# Patient Record
Sex: Male | Born: 1955
Health system: Southern US, Community
[De-identification: ages and names within clinical notes are randomized; demographics above are authoritative.]

## PROBLEM LIST (undated history)

## (undated) DIAGNOSIS — I10 Essential (primary) hypertension: Secondary | ICD-10-CM

## (undated) DIAGNOSIS — G629 Polyneuropathy, unspecified: Secondary | ICD-10-CM

## (undated) DIAGNOSIS — M1460 Charcot's joint, unspecified site: Secondary | ICD-10-CM

## (undated) DIAGNOSIS — E119 Type 2 diabetes mellitus without complications: Secondary | ICD-10-CM

## (undated) DIAGNOSIS — R112 Nausea with vomiting, unspecified: Secondary | ICD-10-CM

## (undated) DIAGNOSIS — K219 Gastro-esophageal reflux disease without esophagitis: Secondary | ICD-10-CM

## (undated) DIAGNOSIS — G4730 Sleep apnea, unspecified: Secondary | ICD-10-CM

## (undated) DIAGNOSIS — Z9889 Other specified postprocedural states: Secondary | ICD-10-CM

## (undated) DIAGNOSIS — F419 Anxiety disorder, unspecified: Secondary | ICD-10-CM

## (undated) DIAGNOSIS — M722 Plantar fascial fibromatosis: Secondary | ICD-10-CM

## (undated) DIAGNOSIS — M1990 Unspecified osteoarthritis, unspecified site: Secondary | ICD-10-CM

## (undated) HISTORY — DX: Anxiety disorder, unspecified: F41.9

## (undated) HISTORY — PX: HERNIA REPAIR: SHX51

## (undated) HISTORY — PX: VEIN SURGERY: SHX48

## (undated) HISTORY — PX: BACK SURGERY: SHX140

## (undated) HISTORY — PX: VEIN LIGATION AND STRIPPING: SHX2653

## (undated) HISTORY — DX: Sleep apnea, unspecified: G47.30

## (undated) HISTORY — DX: Essential (primary) hypertension: I10

## (undated) HISTORY — PX: SPINE SURGERY: SHX786

---
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
## 2010-11-11 ENCOUNTER — Ambulatory Visit: Payer: Self-pay | Admitting: Family Medicine

## 2011-01-27 ENCOUNTER — Inpatient Hospital Stay: Payer: Self-pay | Admitting: Specialist

## 2011-01-29 ENCOUNTER — Inpatient Hospital Stay: Payer: Self-pay | Admitting: Psychiatry

## 2012-04-06 LAB — BASIC METABOLIC PANEL
BUN: 17 mg/dL (ref 4–21)
Creatinine: 1.1 mg/dL (ref 0.6–1.3)
GLUCOSE: 228 mg/dL
POTASSIUM: 4.2 mmol/L (ref 3.4–5.3)
SODIUM: 136 mmol/L — AB (ref 137–147)

## 2012-04-06 LAB — LIPID PANEL
Cholesterol: 187 mg/dL (ref 0–200)
HDL: 47 mg/dL (ref 35–70)
LDL Cholesterol: 122 mg/dL
Triglycerides: 88 mg/dL (ref 40–160)

## 2012-04-06 LAB — TSH: TSH: 1.65 u[IU]/mL (ref 0.41–5.90)

## 2012-04-18 LAB — COMPREHENSIVE METABOLIC PANEL
BUN: 16 mg/dL (ref 7–18)
Bilirubin,Total: 1.3 mg/dL — ABNORMAL HIGH (ref 0.2–1.0)
EGFR (African American): >60
Glucose: 184 mg/dL — ABNORMAL HIGH (ref 65–99)
Osmolality: 278 (ref 275–301)
Potassium: 3.8 mmol/L (ref 3.5–5.1)
SGPT (ALT): 179 U/L — ABNORMAL HIGH (ref 12–78)
Sodium: 136 mmol/L (ref 136–145)
Total Protein: 8.2 g/dL (ref 6.4–8.2)

## 2012-04-18 LAB — ETHANOL
Ethanol %: 0.035 % (ref 0.000–0.080)
Ethanol: 35 mg/dL

## 2012-04-18 LAB — ACETAMINOPHEN LEVEL: Acetaminophen: <2 ug/mL

## 2012-04-18 LAB — CBC
HCT: 44.3 % (ref 40.0–52.0)
HGB: 14.9 g/dL (ref 13.0–18.0)
MCH: 37 pg — ABNORMAL HIGH (ref 26.0–34.0)
MCHC: 33.7 g/dL (ref 32.0–36.0)
MCV: 110 fL — ABNORMAL HIGH (ref 80–100)
RBC: 4.04 10*6/uL — ABNORMAL LOW (ref 4.40–5.90)
RDW: 12.8 % (ref 11.5–14.5)
WBC: 6.5 10*3/uL (ref 3.8–10.6)

## 2012-04-18 LAB — TSH: Thyroid Stimulating Horm: 2.51 u[IU]/mL

## 2012-04-18 LAB — SALICYLATE LEVEL: Salicylates, Serum: <1.7 mg/dL

## 2012-04-19 ENCOUNTER — Inpatient Hospital Stay: Payer: Self-pay | Admitting: Psychiatry

## 2012-04-19 LAB — URINALYSIS, COMPLETE
Bacteria: NONE SEEN
Bilirubin,UR: NEGATIVE
Blood: NEGATIVE
Glucose,UR: NEGATIVE mg/dL (ref 0–75)
Protein: 30
RBC,UR: <1 /HPF (ref 0–5)
Specific Gravity: 1.024 (ref 1.003–1.030)
Squamous Epithelial: NONE SEEN
WBC UR: 1 /HPF (ref 0–5)

## 2012-04-19 LAB — DRUG SCREEN, URINE
Amphetamines, Ur Screen: NEGATIVE (ref ?–1000)
Benzodiazepine, Ur Scrn: POSITIVE (ref ?–200)
Methadone, Ur Screen: NEGATIVE (ref ?–300)
Phencyclidine (PCP) Ur S: NEGATIVE (ref ?–25)
Tricyclic, Ur Screen: NEGATIVE (ref ?–1000)

## 2012-04-24 LAB — HEPATIC FUNCTION PANEL A (ARMC)
Bilirubin,Total: 1.1 mg/dL — ABNORMAL HIGH (ref 0.2–1.0)
SGOT(AST): 235 U/L — ABNORMAL HIGH (ref 15–37)
SGPT (ALT): 260 U/L — ABNORMAL HIGH (ref 12–78)

## 2012-04-27 ENCOUNTER — Ambulatory Visit: Payer: Self-pay | Admitting: Psychiatry

## 2012-05-04 ENCOUNTER — Ambulatory Visit: Payer: Self-pay | Admitting: Psychiatry

## 2012-05-15 ENCOUNTER — Ambulatory Visit: Payer: Self-pay | Admitting: Psychiatry

## 2012-05-24 LAB — HEPATIC FUNCTION PANEL
ALT: 36 U/L (ref 10–40)
AST: 51 U/L — AB (ref 14–40)
Alkaline Phosphatase: 94 U/L (ref 25–125)
BILIRUBIN, TOTAL: 0.6 mg/dL

## 2012-06-03 ENCOUNTER — Inpatient Hospital Stay: Payer: Self-pay | Admitting: Internal Medicine

## 2012-06-03 LAB — COMPREHENSIVE METABOLIC PANEL
Albumin: 3.4 g/dL (ref 3.4–5.0)
Alkaline Phosphatase: 130 U/L (ref 50–136)
BUN: 12 mg/dL (ref 7–18)
Calcium, Total: 8.9 mg/dL (ref 8.5–10.1)
Chloride: 101 mmol/L (ref 98–107)
Creatinine: 1.11 mg/dL (ref 0.60–1.30)
EGFR (Non-African Amer.): >60
Potassium: 4.2 mmol/L (ref 3.5–5.1)

## 2012-06-03 LAB — DRUG SCREEN, URINE
Amphetamines, Ur Screen: NEGATIVE (ref ?–1000)
Benzodiazepine, Ur Scrn: POSITIVE (ref ?–200)
Cannabinoid 50 Ng, Ur ~~LOC~~: NEGATIVE (ref ?–50)
Cocaine Metabolite,Ur ~~LOC~~: NEGATIVE (ref ?–300)
MDMA (Ecstasy)Ur Screen: NEGATIVE (ref ?–500)
Opiate, Ur Screen: NEGATIVE (ref ?–300)
Phencyclidine (PCP) Ur S: NEGATIVE (ref ?–25)

## 2012-06-03 LAB — CBC
HCT: 48.8 % (ref 40.0–52.0)
HGB: 16.8 g/dL (ref 13.0–18.0)
MCH: 37 pg — ABNORMAL HIGH (ref 26.0–34.0)
MCHC: 34.3 g/dL (ref 32.0–36.0)
MCV: 108 fL — ABNORMAL HIGH (ref 80–100)
RBC: 4.53 10*6/uL (ref 4.40–5.90)
RDW: 13.4 % (ref 11.5–14.5)
WBC: 10.3 10*3/uL (ref 3.8–10.6)

## 2012-06-03 LAB — URINALYSIS, COMPLETE
Bilirubin,UR: NEGATIVE
Blood: NEGATIVE
Blood: NEGATIVE
Glucose,UR: NEGATIVE mg/dL (ref 0–75)
Ketone: NEGATIVE
Leukocyte Esterase: NEGATIVE
Leukocyte Esterase: NEGATIVE
Nitrite: NEGATIVE
Ph: 5 (ref 4.5–8.0)
Ph: 5 (ref 4.5–8.0)
Protein: NEGATIVE
Protein: NEGATIVE
RBC,UR: 1 /HPF (ref 0–5)
Specific Gravity: 1.029 (ref 1.003–1.030)
Squamous Epithelial: NONE SEEN
WBC UR: 1 /HPF (ref 0–5)

## 2012-06-03 LAB — LIPASE, BLOOD: Lipase: 295 U/L (ref 73–393)

## 2012-06-03 LAB — TROPONIN I: Troponin-I: <0.02 ng/mL

## 2012-06-03 LAB — ETHANOL: Ethanol %: <0.003 % (ref 0.000–0.080)

## 2012-06-04 LAB — CBC WITH DIFFERENTIAL/PLATELET
Basophil %: 1 %
Eosinophil %: 3.4 %
HCT: 31.7 % — ABNORMAL LOW (ref 40.0–52.0)
HGB: 11.2 g/dL — ABNORMAL LOW (ref 13.0–18.0)
Lymphocyte #: 1 10*3/uL (ref 1.0–3.6)
MCV: 107 fL — ABNORMAL HIGH (ref 80–100)
Monocyte #: 0.3 x10 3/mm (ref 0.2–1.0)
Monocyte %: 5 %
Platelet: 127 10*3/uL — ABNORMAL LOW (ref 150–440)
RBC: 2.98 10*6/uL — ABNORMAL LOW (ref 4.40–5.90)
RDW: 13.6 % (ref 11.5–14.5)

## 2012-06-04 LAB — BASIC METABOLIC PANEL
Calcium, Total: 7.8 mg/dL — ABNORMAL LOW (ref 8.5–10.1)
Chloride: 107 mmol/L (ref 98–107)
Co2: 26 mmol/L (ref 21–32)
Creatinine: 0.77 mg/dL (ref 0.60–1.30)
EGFR (African American): >60
EGFR (Non-African Amer.): >60
Glucose: 99 mg/dL (ref 65–99)
Osmolality: 278 (ref 275–301)

## 2012-06-04 LAB — LIPID PANEL
HDL Cholesterol: 24 mg/dL — ABNORMAL LOW (ref 40–60)
VLDL Cholesterol, Calc: 22 mg/dL (ref 5–40)

## 2012-06-04 LAB — HEMOGLOBIN A1C: Hemoglobin A1C: 5.3 % (ref 4.2–6.3)

## 2012-06-05 LAB — HEMOGLOBIN: HGB: 12.3 g/dL — ABNORMAL LOW (ref 13.0–18.0)

## 2012-06-15 ENCOUNTER — Ambulatory Visit: Payer: Self-pay | Admitting: Psychiatry

## 2012-08-20 LAB — HEMOGLOBIN A1C: Hemoglobin A1C: 5.8

## 2013-03-17 HISTORY — PX: COLON SURGERY: SHX602

## 2014-07-07 NOTE — H&P (Signed)
PATIENT NAME:  Dustin Guerra, Dustin Guerra MR#:  [id number] DATE OF BIRTH:  1955-07-20  DATE OF ADMISSION:  06/03/2012  PRIMARY CARE PHYSICIAN: Kirstie Peri. Caryn Section, MD  REFERRING PHYSICIAN: Conni Slipper, MD  CHIEF COMPLAINT: Altered mental status.   HISTORY OF PRESENT ILLNESS: A 59 year old Caucasian male with a history of hypertension and alcohol abuse, who was brought to the ED due to altered mental status. The patient is more awake now, according to him and his wife. The patient drank alcohol last night, felt dizzy and slept on the couch at home. He called his wife this morning, and his wife found that he was in covered with stool, with decreased mental status, so the patient was sent by EMS to the hospital for further evaluation. The patient was noted to have low blood pressure at 80s and also temperature is 94 to 95. He was treated with normal saline bolus and warm blankets. Blood pressure is still low. The patient denies any fever or chills. No headache or dizziness. No chest pain, palpitation. No abdominal pain, nausea, vomiting or diarrhea. The patient had abdominal pain when the patient came to the ED. Abdominal CAT scan was negative for any obstruction.   PAST MEDICAL HISTORY: Hypertension, alcohol abuse.   SOCIAL HISTORY: No smoking, but drinks alcohol on a daily basis. The patient got detoxed in January and stopped drinking, but started again. Denies any  drug abuse.   PAST SURGICAL HISTORY: Back surgery.   FAMILY HISTORY: Grandmother had diabetes.   REVIEW OF SYSTEMS:  CONSTITUTIONAL: The patient denies any fever or chills. No headache or dizziness, but has weakness.  EYES: No double vision or blurred vision.  ENT: No postnasal drip, slurred speech or dysphagia.  CARDIOVASCULAR: No chest pain, palpitation, orthopnea or nocturnal dyspnea. No leg edema.  PULMONARY: Mild cough. No shortness of breath or hemoptysis. No wheezing or sputum.  GASTROINTESTINAL: No abdominal pain, nausea, vomiting or  diarrhea. No melena. No bloody stool.  GENITOURINARY: No dysuria, hematuria or incontinence.  SKIN: No rash or jaundice.  NEUROLOGY: Has altered mental status, but no seizure, and questionable loss of consciousness. Also has a tremor.   ALLERGIES: None.   HOME MEDICATIONS:  1. Sertraline 50 mg p.o. b.i.d.  2. Ranitidine 150 mg p.o. q.12 hours.  3. Propranolol 40 mg p.o. b.i.d. for benign tremor.  4. Metformin 500 mg p.o. b.i.d. 5. Norvasc 5 mg p.o. daily   PHYSICAL EXAMINATION:  VITAL SIGNS: Temperature 94.5, blood pressure was 85/55 and now is 100/65, pulse 90, O2 saturation 95% on oxygen.  GENERAL: The patient is weak, but confused, in no acute distress.  HEENT: Pupils round and equally reactive to light and accommodation. Moist oral mucosa. Clear oropharynx.  NECK: Supple. No JVD or carotid bruits. No lymphadenopathy. No thyromegaly.  CARDIOVASCULAR: S1, S2, regular rate and rhythm. No murmurs or gallops.  PULMONARY: Bilateral air entry. No wheezing or rales. No use of accessory muscles to breathe.  ABDOMEN: Soft. No distention. No tenderness. No organomegaly. Bowel sounds present.  EXTREMITIES: No edema, clubbing or cyanosis. No calf tenderness. Pedal pulses present.  SKIN: No rash or jaundice.  NEUROLOGIC: The patient is awake, alert, but confused. Follows commands. No focal deficits. Power 5/5. Sensation intact.   LABORATORY DATA: CAT scan of head: No evidence of acute ischemic infarct or hemorrhage, diffuse cerebral atrophy. CAT scan of abdomen and pelvis did not show any obstruction or acute inflammation. WBC 10.3, hemoglobin 16.8, platelets 260. Glucose 189, BUN 12, creatinine 1.11,  sodium 137, potassium 4.2, chloride 101, bicarbonate 23. Troponin less than 0.02. Alcohol level less than 3. Lipase 295. EKG showed normal sinus rhythm at 75 BPM.   IMPRESSION:  1. Hypotension, possibly due to dehydration.  2. Altered mental status, possibly due to hypotension or dehydration or  alcohol.  3. Hypothermia.  4. Alcohol abuse.  5. History of hypertension.   PLAN OF TREATMENT:  1. The patient will be admitted to medical floor. Will hold Norvasc. Give normal saline IV. Follow up BMP, magnesium level and ammonia level.  2. For hypothermia, continue warm blankets.  3. For alcohol abuse, we will start CIWA protocol, watch for DT.  4. For diabetes, will start sliding scale. Hold metformin and check hemoglobin A1c and lipid panel.  5. Fall and aspiration precautions.  6. Gastrointestinal and deep vein thrombosis prophylaxis.   I discussed the patient's condition and the plan of treatment with the patient and the patient's wife.   TIME SPENT: About 58 minutes.   ____________________________ Demetrios Loll, MD qc:OSi D: 06/03/2012 12:15:37 ET T: 06/03/2012 12:32:20 ET JOB#: [id number]  cc: Demetrios Loll, MD, <Dictator> Demetrios Loll MD ELECTRONICALLY SIGNED 06/05/2012 18:10

## 2014-07-07 NOTE — H&P (Signed)
PATIENT NAME:  Dustin Guerra, Dustin Guerra MR#:  [id number] DATE OF BIRTH:  February 05, 1956  DATE OF ADMISSION:  04/19/2012  INITIAL ASSESSMENT AND PSYCHIATRIC EVALUATION    IDENTIFYING INFORMATION: The patient is a 59 year old white male, not employed right now but was employed as a pipe fitter a few months ago; and his boss man told him that he had to get help before he can come back to work for him, and he was recommended to get help for his alcohol drinking.  The patient is married for the second time for 27 years and lives with his wife.    CHIEF COMPLAINT: The patient comes for admission to [facility] with a chief complaint, "I need to quit drinking.  I need help for this problem."  HISTORY OF PRESENT ILLNESS: When the patient was asked when he last felt well, he reported that he cannot remember when he last felt well.  He reports that he has been drinking alcohol at a rate of 2 fifths per day for the past 3 months.  Prior to that, he drank alcohol but much less than this.  He reports  family-related stressors.  He reported, "I always drink alcohol."    PAST PSYCHIATRIC HISTORY:  History of inpatient on Psychiatry in November 2012 for a similar problem of wanting to stop drinking alcohol.  He was detoxed and sent home, and he reports that he started drinking 3 months later because of family-related stuff.  He just stayed away from alcohol and was able to stay sober for 3 months.  No history of suicide attempt.  He is not being followed by any psychiatrist.  He has never been to any alcohol programs, so far.   FAMILY HISTORY: He was raised by his parents.  Father was a truck Geophysicist/field seismologist.  He died of throat cancer.  Mother was a housewife, died of alcoholism.  His brother had prescription pill abuse problem and died from overdose of the same.    PERSONAL HISTORY:  He was born in [place].  He graduated from high school.  No college.  He was raised by his family, as stated above.    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  He last worked a few months ago because of his alcohol drinking and was told by the boss man to get help for his alcohol drinking, and he will hire him back.  MILITARY HISTORY:   None.   MARRIAGES: Married twice.  First marriage lasted for a few years.  She left him.  He has 1 daughter from the first marriage who is in her 10s and lives in [place].  He is in touch with her.  The second marriage is for 27 years.  He has 2 children from the second marriage.    ALCOHOL AND DRUGS:  First drink of alcohol was after high school.  It became a problem in 2006 when he became a heavy drinker of alcohol.  No history of DWIs, never arrested for public drunkenness.  He does admit to DTs, and he saw things and saw visions around.  He admits to blackouts and admits to feeling nervous and having tremors.  Currently, he has been drinking at the rate of 2 fifths of hard liquor per day.  He denies THC smoking,  denies street or prescription drug abuse.  He denies smoking nicotine cigarettes.    PAST MEDICAL HISTORY:  He has high blood pressure and, in fact, labile hypertension and is on medications for the same.  No known history of diabetes mellitus.  He status post back surgery 22 years ago, status post surgery for varicose vein stripping 35 years ago.  No history of motor vehicle accident or being unconscious.  He is being [facility] at [facility].  Last appointment was some time ago.  Next appointment is coming up 04/27/2012.    CURRENT MEDICATIONS: Prozac and Valium 5 mg a few times a day.  He takes propranolol and amlodipine for blood pressure, and is not sure of the exact medication.   PHYSICAL EXAMINATION:  VITAL SIGNS: Blood pressure is 148/90 mmHg, pulse is 92 per minute and regular, respirations 20 per minute,  temperature is 96.2.  HEENT: Head is normocephalic, atraumatic.  Eyes: PERRLA.  Fundi are bilaterally benign.  EOMs are intact.  Tympanic membranes are visualized.  No exudate.  LUNGS:  Clear to auscultation.  HEART: Normal S1, S2 without any murmurs or gallops. ABDOMEN: Soft, no organomegaly.  Bowel sounds heard. SKIN: No rashes, no bruises.  RECTAL: Deferred. NEUROLOGICAL:  Gait is normal, though it is unsteady, and he has been using a walker because of him being intoxicated with alcohol.  Cranial nerves II through XII are grossly intact.  DTRs and 2+ normal, plantars are normal response.   MENTAL STATUS EXAMINATION:  The patient is dressed in hospital scrubs, appears intoxicated and face is red.  He knows his person, though he said he knew it was 2014, February, but could not tell the exact date.  He is pleasant, polite and cooperative.  He appears very shaky and tremulous and nervous.  Mood is very anxious with flat affect.  Thought process is logical, and he wants to get help for his alcohol drinking so that he can go back to work.  He denies any ideas or plans to hurt himself or others.  He denies any visual or auditory hallucinations, denies any paranoia or suspicious ideas.  He could spell the word world forward and backward without any problems.  He can count money.  He could recall, but he took some time because he is not able to focus and pay attention.  He knew the cap[place] of [place], and the [place] the [place], and the name of the current Software engineer.  He does admit to appetite and sleep disturbance because of his alcohol drinking.  Insight and judgment are guarded.    IMPRESSION:  AXIS I:   1. Alcohol dependence, chronic, continuous, with intoxication.  2. Alcohol withdrawal delirium, resolving with help. 3. Substance-induced mood disorder.    AXIS II:  Deferred.   AXIS III:  Hypertension.  AXIS IV:  Severe.  Long history of alcohol dependence which has resulted in  occupational and financial problems.   AXIS V:  Global Assessment of Functioning score is 25.   PLAN: The patient is admitted to [facility] for close observation and management.  He will be started on CIWA protocol and will be continued on the rest of his medications.  During the stay in the hospital, he will be given milieu therapy and supportive counseling.  He will take part in individual and group therapy where substance abuse issues will be addressed.  At the time of discharge, he will complete his alcohol detox and will have  enough insight into his substance abuse problem.  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   ____________________________ Wallace Cullens. Franchot Mimes, MD skc:cb D: 04/19/2012 16:16:23 ET T: 04/19/2012 17:50:10 ET JOB#: [id number] cc: Arlyn Leak K. Franchot Mimes, MD, <Dictator> Dewain Penning MD ELECTRONICALLY SIGNED 04/20/2012 13:43

## 2014-07-07 NOTE — H&P (Signed)
PATIENT NAME:  Dustin Guerra, Dustin Guerra MR#:  [id number] DATE OF BIRTH:  21-Dec-1955  DATE OF ADMISSION:  04/19/2012  REFERRING PHYSICIAN: Lenise Arena, MD  ATTENDING PHYSICIAN:  Orson Slick, MD   IDENTIFYING DATA: Dustin Guerra is a 59 year old male with alcoholism.   CHIEF COMPLAINT:  "I need to stop."   HISTORY OF PRESENT ILLNESS: The patient is a lifelong alcoholic. He had a 10-year period of sobriety but relapsed a long time ago. He was hospitalized at [facility] in November 2012 for alcohol detox. He was able to maintain sobriety for 3 months and then relapsed on alcohol. He used to drink 1/2 gallon a day, recently a fifth a day. He reports multiple stressors. He lost his house, his marriage fell apart; even though they live together, his wife stays in a separate room, and they do not talk to each other.  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 He got a message from his old boss that he could be hired again if he sobers up and straightens up. The patient feels that this is a good time to try.  He moved to a new place, a condo that he owns. He hopes that his wife will come around and stay with him, as they never were formerly divorced. He still is upset about losing his house, but it seems that he has a new chance and needs to get over it. He is very clear that he does not want to do alcohol rehab and came here for detox only.  He denies symptoms of depression, anxiety, or psychosis and feels that he is simply a drunk.   PAST PSYCHIATRIC HISTORY: Lifelong history of alcoholism with a 10-year period of sobriety.  No psychiatric hospitalizations except for the one in 2012 here.  No suicide attempt. No substance use other than alcohol use.   FAMILY PSYCHIATRIC HISTORY: His mother was an alcoholic and died from it.  His brother died at the age of 19 from prescription pill overdose.   PAST MEDICAL HISTORY: Hypertension, dyslipidemia, diabetes.   MEDICATIONS ON ADMISSION: Zoloft 50  mg daily, Valium 10 mg twice daily, propranolol 40 mg twice daily, amlodipine 5 mg daily.   ALLERGIES: No known drug allergies.   SOCIAL HISTORY: He is originally from [facility]. He graduated from high school.  He is married for the second time, although his marriage is on the rocks. He has 3 children and grandchildren. 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 He reports no DUIs or legal problems.   REVIEW OF SYSTEMS:  CONSTITUTIONAL: No fevers or chills. No weight changes.  EYES: No double or blurred vision.  ENT: No hearing loss.  RESPIRATORY: No shortness of breath or cough.  CARDIOVASCULAR: No chest pain or orthopnea.  GASTROINTESTINAL: No abdominal pain, nausea, vomiting, or diarrhea.  GENITOURINARY: No incontinence or frequency.  ENDOCRINE: No heat or cold intolerance.  LYMPHATIC: No anemia or easy bruising.   INTEGUMENTARY: No acne or rash.  MUSCULOSKELETAL: No muscle or joint pain.  NEUROLOGIC: No tingling or weakness.  PSYCHIATRIC: See history of present illness for details.   PHYSICAL EXAMINATION: VITAL SIGNS: Blood pressure 157/109, pulse 100, respirations 20, temperature 97.7.  GENERAL: This is a well-developed gentleman looking older than his stated age, in no acute distress.  HEENT: The pupils are equal, round, and reactive to light. Sclerae are anicteric.  NECK: Supple. No thyromegaly.  LUNGS: Clear to auscultation. No dullness to percussion.  HEART: Regular  rhythm and rate. No murmurs, rubs, or gallops.  ABDOMEN: Soft, nontender, nondistended. Positive bowel sounds.  MUSCULOSKELETAL: Normal muscle strength in all extremities.  SKIN: No rashes or bruises.  LYMPHATIC: No cervical adenopathy.  NEUROLOGICAL: Cranial nerves II through XII are intact.   LABORATORY DATA: Chemistries are within normal limits except for blood glucose of 184. Blood alcohol level is 0.035.  LFTs:  Total bilirubin 1.3, AST 222, ALT 179, TSH 2.51. Urine tox screen positive for  benzodiazepines. CBC within normal limits except for MCV of 110. Urinalysis is not suggestive of urinary tract infection. Serum acetaminophen and salicylates are low.   MENTAL STATUS EXAMINATION ON ADMISSION: The patient is examined in the  Emergency Room. He is asleep but easily arousable. He is oriented to person, place, time, and situation. He is pleasant, polite, and cooperative. He is wearing hospital scrubs. He maintains good eye contact. His speech is soft and slow. Mood is okay with anxious affect. Thought processing is logical and goal oriented with poverty of thought. He denies thoughts of hurting himself or others. There are no delusions or paranoia. He denies auditory or visual hallucinations. He has a history of complicated detox but did not experience any symptoms so far. His cognition is grossly intact. His insight and judgment are questionable.   SUICIDE RISK ASSESSMENT ON ADMISSION: This is a patient with a long history of alcoholism who has been drinking for the past two years, came for detox when faced with the prospect of regaining his employment.   INITIAL DIAGNOSES:  AXIS I:  Alcohol dependence.   AXIS II: Deferred.   AXIS III: Hypertension, diabetes, alcoholic liver disease.   AXIS IV: Substance abuse, relationship, financial, employment, access to care, treatment compliance.   AXIS V: Global Assessment of Functioning score on admission 35.   PLAN: The patient was admitted to [facility] for safety, stabilization and medication management. He was initially placed on suicide precautions and was closely monitored for any unsafe behaviors. He underwent full psychiatric and risk assessment. He received pharmacotherapy, individual and group psychotherapy, substance abuse counseling, and support from therapeutic milieu.   1. Alcohol detox: He was placed on a CIWA protocol. He was given Librium 25 mg 4 times a day, in addition.   2. Substance abuse treatment: The patient declines. We will try to see if he would participate in IOP.   3. Medical: We will continue his antihypertensives and monitor his blood glucose.  4. Disposition: He will most likely return to home.   ____________________________ Herma Ard B. Bary Leriche, MD jbp:cb D: 04/20/2012 16:06:15 ET T: 04/20/2012 17:26:30 ET JOB#: [id number]  cc: Remi Lopata B. Bary Leriche, MD, <Dictator> Clovis Fredrickson MD ELECTRONICALLY SIGNED 05/13/2012 6:41

## 2014-07-07 NOTE — Discharge Summary (Signed)
PATIENT NAME:  Dustin Guerra, Dustin Guerra MR#:  [id number] DATE OF BIRTH:  [birth year]  DATE OF ADMISSION:  06/03/2012 DATE OF DISCHARGE:  06/05/2012  PRIMARY CARE PHYSICIAN: Kirstie Peri. Caryn Section, MD  DISCHARGE DIAGNOSES:  1.  Altered mental status with metabolic encephalopathy.  2.  Hypotension.  3.  Dehydration.  4.  Hypothermia.  5.  Alcohol abuse.  6.  Hypertension.  7.  Anemia.  8.  Thrombocytopenia.   CODE STATUS: Full code.   HOME MEDICATIONS:  1.  Metformin 500 mg p.o. b.i.d.  2.  Propranolol 40 mg p.o. b.i.d. for benign tremor.  3.  Norvasc 5 mg p.o. daily.  4.  Ranitidine 150 mg p.o. q.12 hours. 5.  Sertraline 50 mg p.o. b.i.d.  6.  Campral 333 mg p.o. tablets 2 tabs 3 times a day.  7.  Valium 10 mg p.o. every 8 hours p.r.n.   DIET: Low sodium, low fat, low cholesterol, ADA diet.   ACTIVITY: As tolerated.   FOLLOWUP CARE: Follow up with PCP within 1 to 2 weeks.   REASON FOR ADMISSION: Altered mental status.   HOSPITAL COURSE: The patient is a 59 year old Caucasian male with a history of hypertension, alcohol abuse, who was brought to the ED due to altered mental status. For detailed history and physical examination, please refer to the admission note dictated by me. The patient was found to have low blood pressure in 80s in ED, was treated with normal saline bolus  3 liters, still had low blood pressure so the patient was admitted for hypotension. In addition, the patient's temperature was 94, was treated with warm blanket. The patient's altered mental status is possibly due to metabolic encephalopathy secondary to hypotension, dehydration or alcohol abuse. The patient was admitted to CCU for hypotension and altered mental status. After admission, the patient has been treated with IV fluid support. Blood pressure has been stable around 100 with IV fluid support.   For alcohol abuse, the patient has been placed on CIWA protocol, but no tremor or shaking, no evidence of DTs.   For  diabetes, the patient's metformin was on hold. Restart sliding scale. Blood sugar has been stable.   For anemia, the patient's hemoglobin decreased to 11.2 from normal range, which is possibly due to IV fluid dilution. The patient's hemoglobin increased to 12.3 today. Stool occult is negative. The patient has no symptoms. Vital signs stable. The patient's hemoglobin A1c is 5.3. The patient is clinically stable and will be discharged to home today. Also, the patient's ammonia is 36.   Discussed the patient's discharge plan with the patient and the case manager.   TIME SPENT: About 33 minutes.    ____________________________ Demetrios Loll, MD qc:jm D: 06/05/2012 15:13:32 ET T: 06/05/2012 15:43:30 ET JOB#: [id number]  cc: Demetrios Loll, MD, <Dictator> Demetrios Loll MD ELECTRONICALLY SIGNED 06/05/2012 18:18

## 2014-07-07 NOTE — Consult Note (Signed)
Met with patient at the request of Dr. Gavin Pound, Psychologist to explore possibility of participation in the [facility] Program (CD-IOP) at this inpatient discharge. At the time of meeting with him, he exhibited speech flow that was normal, oriented X5, cooperative. He also demonstrated tremors, psychomotor agitation as well as anxiety and therefore was not oriented to the CD-IOP. Assigned nursed informed of status of patient. Plans are to return to assess the patient for the CD-IOP on Thursday for possible treatment in the CD-IOP at this [place] inpatient discharge.      Electronic Signatures: Laqueta Due (PsyD)  (Signed on 04-Feb-14 16:34)  Authored  Last Updated: 04-Feb-14 16:34 by Laqueta Due ([place])

## 2014-07-07 NOTE — Consult Note (Signed)
Consult:  Patient was interviewed and oriented to the CD-IOP and was seen as an excellent candidate for treatment at this level of care was also able to agree that at this in patient discharge he plans to attend the CD-IOP. RN informed of plans to attend the [facility] CD-IOP and therefore will meet with patient Tuesday at [facility] to complete admission intake  in order to participate in the CD-IOP treatment at this discharge starting Tuesday April 27, 2012.   Electronic Signatures: Laqueta Due (PsyD)  (Signed on 07-Feb-14 18:04)  Authored  Last Updated: 07-Feb-14 18:04 by Laqueta Due (PsyD)

## 2015-01-17 ENCOUNTER — Encounter: Payer: Self-pay | Admitting: Family Medicine

## 2015-01-17 LAB — HM COLONOSCOPY

## 2015-07-27 ENCOUNTER — Telehealth: Payer: Self-pay | Admitting: Family Medicine

## 2015-07-27 NOTE — Telephone Encounter (Signed)
[place] the [place] 5 [place] wanted to know if he can get reestablish with you  Cb# [phone]

## 2015-07-30 NOTE — Telephone Encounter (Signed)
Pt called back asking if a decision has been made.  He stated that he has had a lot of surgeries in the past couple years.   Dealing with ruptured colon and has neuropathy in his feet from past diabetes.   He is not on any medications now.  He most presently has been followed by a family doctor in [facility].   His call back is [phone]  Thanks, [name]

## 2015-07-31 NOTE — Telephone Encounter (Signed)
That's fine. It looks like he was last seen in June 2014.

## 2015-07-31 NOTE — Telephone Encounter (Signed)
I spoke with pt and scheduled appt for Friday 08/03/15. Thanks TNP

## 2015-08-01 DIAGNOSIS — F10931 Alcohol use, unspecified with withdrawal delirium: Secondary | ICD-10-CM | POA: Insufficient documentation

## 2015-08-01 DIAGNOSIS — F329 Major depressive disorder, single episode, unspecified: Secondary | ICD-10-CM | POA: Insufficient documentation

## 2015-08-01 DIAGNOSIS — R748 Abnormal levels of other serum enzymes: Secondary | ICD-10-CM | POA: Insufficient documentation

## 2015-08-01 DIAGNOSIS — F410 Panic disorder [episodic paroxysmal anxiety] without agoraphobia: Secondary | ICD-10-CM | POA: Insufficient documentation

## 2015-08-01 DIAGNOSIS — F32A Depression, unspecified: Secondary | ICD-10-CM | POA: Insufficient documentation

## 2015-08-01 DIAGNOSIS — I10 Essential (primary) hypertension: Secondary | ICD-10-CM | POA: Insufficient documentation

## 2015-08-01 DIAGNOSIS — F1011 Alcohol abuse, in remission: Secondary | ICD-10-CM | POA: Insufficient documentation

## 2015-08-01 DIAGNOSIS — R7303 Prediabetes: Secondary | ICD-10-CM | POA: Insufficient documentation

## 2015-08-01 DIAGNOSIS — F419 Anxiety disorder, unspecified: Secondary | ICD-10-CM | POA: Insufficient documentation

## 2015-08-01 DIAGNOSIS — Z91030 Bee allergy status: Secondary | ICD-10-CM | POA: Insufficient documentation

## 2015-08-01 DIAGNOSIS — F10231 Alcohol dependence with withdrawal delirium: Secondary | ICD-10-CM | POA: Insufficient documentation

## 2015-08-01 DIAGNOSIS — L719 Rosacea, unspecified: Secondary | ICD-10-CM | POA: Insufficient documentation

## 2015-08-01 DIAGNOSIS — K219 Gastro-esophageal reflux disease without esophagitis: Secondary | ICD-10-CM | POA: Insufficient documentation

## 2015-08-03 ENCOUNTER — Ambulatory Visit (INDEPENDENT_AMBULATORY_CARE_PROVIDER_SITE_OTHER): Payer: Self-pay | Admitting: Family Medicine

## 2015-08-03 ENCOUNTER — Encounter: Payer: Self-pay | Admitting: Family Medicine

## 2015-08-03 VITALS — BP 148/82 | HR 76 | Temp 98.1°F | Resp 16 | Ht 74.0 in | Wt 238.0 lb

## 2015-08-03 DIAGNOSIS — M545 Low back pain, unspecified: Secondary | ICD-10-CM

## 2015-08-03 DIAGNOSIS — G629 Polyneuropathy, unspecified: Secondary | ICD-10-CM

## 2015-08-03 DIAGNOSIS — M722 Plantar fascial fibromatosis: Secondary | ICD-10-CM

## 2015-08-03 MED ORDER — TRAMADOL HCL 50 MG PO TABS: 50.0000 mg | ORAL_TABLET | Freq: Three times a day (TID) | ORAL | Status: DC | PRN

## 2015-08-03 MED ORDER — HYDROCODONE-ACETAMINOPHEN 7.5-325 MG PO TABS: ORAL_TABLET | ORAL | Status: DC

## 2015-08-03 NOTE — Progress Notes (Signed)
Patient ID: Dustin Guerra, male   DOB: Aug 03, 1955, 60 y.o.   MRN: [id number]       Patient: Dustin Guerra Male    DOB: 1955/03/22   60 y.o.   MRN: [id number] Visit Date: 08/03/2015  Today's Provider: Lelon Huh, MD   Chief Complaint  Patient presents with  . New Patient (Initial Visit)    re- establish   Subjective:    HPI Pt is here today to re- establish care. He has been living in [place]. He was previously being treated for DM when he was a patient here in 2014. He has since been taking off all DM medications and blood pressure medication. He had a colon resection due to severe diverticulitis and was hospitalized for a month, with nothing orally. He has not done labs done since late 2016. He has chronic back pain and plantar fasciitis and neuropathy in feet. He is needing a refill on Tramadol 50mg  and Hydrocodone/Apap 7.5/325 mg today that his MD in [facility] had been giving him. He only takes them if he needs them and usually only takes 1/2 twice a day of the Hydrocodone/APAP.  He has all of his prescription bottles from previous MD with him today.   He states he occasionally checks his blood sugars which are usually around 100.   He has recently started a new job in [employer] and expects to have insurance in a few weeks or so.     Allergies  Allergen Reactions  . Codeine Itching   Previous Medications   ASPIRIN 81 MG TABLET    Take by mouth.   DIAZEPAM (VALIUM) 10 MG TABLET    Take by mouth.   DOCUSATE SODIUM (COLACE) 100 MG CAPSULE    Take 100 mg by mouth 2 (two) times daily.   EPINEPHRINE (EPIPEN 2-PAK) 0.3 MG/0.3 ML IJ SOAJ INJECTION       GABAPENTIN (NEURONTIN) 300 MG CAPSULE    Take 300 mg by mouth 3 (three) times daily.   HYDROCODONE-ACETAMINOPHEN (NORCO) 7.5-325 MG TABLET    Take 0.5 tablets by mouth 2 (two) times daily. PRN   IRON PO    Take by mouth.   LANSOPRAZOLE (PREVACID) 15 MG CAPSULE    Take 15 mg by mouth daily at 12 noon.   METRONIDAZOLE  (METROGEL) 1 % GEL       MULTIPLE VITAMINS-MINERALS (CENTRUM SILVER) CHEW    Chew by mouth.   OMEGA-3-6-9 CAPS    Take by mouth 2 (two) times daily.   TRAMADOL (ULTRAM) 50 MG TABLET    Take by mouth every 6 (six) hours as needed.    Review of Systems  Constitutional: Negative.   HENT: Positive for dental problem.   Eyes: Positive for photophobia and discharge.  Respiratory: Negative.   Cardiovascular: Negative.   Gastrointestinal: Negative.   Endocrine: Negative.   Genitourinary: Negative.   Musculoskeletal: Positive for arthralgias.  Skin: Negative.   Allergic/Immunologic: Negative.   Neurological: Negative.   Hematological: Negative.   Psychiatric/Behavioral: Negative.     Social History  Substance Use Topics  . Smoking status: Former Research scientist (life sciences)  . Smokeless tobacco: Not on file     Comment: quit 2000, smoked about 1.5 packs for about 5 years  . Alcohol Use: No   Objective:   BP 148/82 mmHg  Pulse 76  Temp(Src) 98.1 F (36.7 C) (Oral)  Resp 16  Ht 6\' 2"  (1.88 m)  Wt 238 lb (107.956 kg)  BMI 30.54 kg/m2  Physical Exam   General Appearance:    Alert, cooperative, no distress  Eyes:    PERRL, conjunctiva/corneas clear, EOM's intact       Lungs:     Clear to auscultation bilaterally, respirations unlabored  Heart:    Regular rate and rhythm  Neurologic:   Awake, alert, oriented x 3. No apparent focal neurological           defect.          Assessment & Plan:     1. Neuropathy (HCC)  - gabapentin (NEURONTIN) 300 MG capsule; Take 300 mg by mouth 3 (three) times daily. - HYDROcodone-acetaminophen (NORCO) 7.5-325 MG tablet; 1/2-1 tablet up to four times a day as needed  Dispense: 120 tablet; Refill: 0 - traMADol (ULTRAM) 50 MG tablet; Take 1 tablet (50 mg total) by mouth 3 (three) times daily as needed.  Dispense: 90 tablet; Refill: 5  2. Plantar fasciitis   3. Bilateral low back pain without sciatica  - celecoxib (CELEBREX) 200 MG capsule; Take 200 mg by mouth 2  (two) times daily.     Follow up for routine labs in 1-2 months.   Lelon Huh, MD  [facility]

## 2015-08-04 DIAGNOSIS — G609 Hereditary and idiopathic neuropathy, unspecified: Secondary | ICD-10-CM | POA: Insufficient documentation

## 2015-08-04 DIAGNOSIS — M722 Plantar fascial fibromatosis: Secondary | ICD-10-CM | POA: Insufficient documentation

## 2015-08-04 HISTORY — DX: Plantar fascial fibromatosis: M72.2

## 2015-09-11 ENCOUNTER — Other Ambulatory Visit: Payer: Self-pay | Admitting: Family Medicine

## 2015-09-11 DIAGNOSIS — G629 Polyneuropathy, unspecified: Secondary | ICD-10-CM

## 2015-09-11 NOTE — Telephone Encounter (Signed)
Please review-aa 

## 2015-09-11 NOTE — Telephone Encounter (Signed)
Pt contacted office for refill request on the following medications: HYDROcodone-acetaminophen (NORCO) 7.5-325 MG tablet Last OV: 08/03/15 Last written: 08/03/15 Pt was advised that Dr. Caryn Section is out of the office this week and this will have to be sent to another provider in the office. Please advise. Thanks TNP

## 2015-09-12 MED ORDER — HYDROCODONE-ACETAMINOPHEN 7.5-325 MG PO TABS: ORAL_TABLET | ORAL | Status: DC

## 2015-09-12 NOTE — Telephone Encounter (Signed)
1rf 

## 2015-09-12 NOTE — Telephone Encounter (Signed)
Rx printed and ready to sign.

## 2015-09-14 ENCOUNTER — Telehealth: Payer: Self-pay | Admitting: Family Medicine

## 2015-09-14 NOTE — Telephone Encounter (Signed)
Pt contacted office for refill request on the following medications: 1. gabapentin (NEURONTIN) 300 MG capsule  2. celecoxib (CELEBREX) 200 MG capsule 3. cilostazol (PLETAL) 100 MG tablet [facility] [address] [name] stated that when he came in for an OV with Dr. Caryn Section on 08/03/15 he went over the medications that he was taken. They were provided by another provider during the time that pt had left our office b/c he had moved away. Pt would like Dr. Caryn Section to take over the medications. Pt was advised that Dr. Caryn Section is out of the office until Monday 09/17/15. Please advise. Thanks TNP

## 2015-09-14 NOTE — Telephone Encounter (Signed)
Please review-aa 

## 2015-09-15 ENCOUNTER — Other Ambulatory Visit: Payer: Self-pay | Admitting: Family Medicine

## 2015-09-15 DIAGNOSIS — G629 Polyneuropathy, unspecified: Secondary | ICD-10-CM

## 2015-09-15 DIAGNOSIS — M545 Low back pain, unspecified: Secondary | ICD-10-CM

## 2015-09-15 MED ORDER — CILOSTAZOL 100 MG PO TABS: 100.0000 mg | ORAL_TABLET | Freq: Two times a day (BID) | ORAL | Status: DC | PRN

## 2015-09-15 MED ORDER — CELECOXIB 200 MG PO CAPS: 200.0000 mg | ORAL_CAPSULE | Freq: Two times a day (BID) | ORAL | Status: DC

## 2015-09-15 MED ORDER — GABAPENTIN 300 MG PO CAPS: 300.0000 mg | ORAL_CAPSULE | Freq: Three times a day (TID) | ORAL | Status: DC

## 2015-09-25 ENCOUNTER — Telehealth: Payer: Self-pay | Admitting: Emergency Medicine

## 2015-09-25 DIAGNOSIS — G629 Polyneuropathy, unspecified: Secondary | ICD-10-CM

## 2015-09-25 NOTE — Telephone Encounter (Signed)
Pt called to find out if there was a reason his Gabapentin was reduced from 2 TID to 1 TID. He has been taking 2 TID when he was [place]. He did not know if there is a reason or if it was a mistake. Please advise.   Can leave message.  [facility] [address]

## 2015-09-26 NOTE — Telephone Encounter (Signed)
Medication was [place] by [place] Medication list that was put into computer at his office visit on 5/19  was 300mg  three times a day. Please double check and make sure is is actually 2 x 300mg  tablets three times a day. If so can send in new with new sig for #180, rf x 5

## 2015-09-26 NOTE — Telephone Encounter (Signed)
Pt is requesting a call back.  CB#[phone]/MW

## 2015-09-27 NOTE — Telephone Encounter (Signed)
Left message to call back  

## 2015-10-05 MED ORDER — GABAPENTIN 300 MG PO CAPS: 600.0000 mg | ORAL_CAPSULE | Freq: Three times a day (TID) | ORAL | Status: DC

## 2015-10-05 NOTE — Telephone Encounter (Signed)
LMOVM for pt to return call 

## 2015-10-05 NOTE — Telephone Encounter (Signed)
Medication dosage and sig were verified and pt is taking 2 300 mg tablets three times a day. Medication was sent to pharmacy. Thanks.

## 2015-10-22 ENCOUNTER — Other Ambulatory Visit: Payer: Self-pay | Admitting: Family Medicine

## 2015-10-22 DIAGNOSIS — G629 Polyneuropathy, unspecified: Secondary | ICD-10-CM

## 2015-10-22 MED ORDER — HYDROCODONE-ACETAMINOPHEN 7.5-325 MG PO TABS: ORAL_TABLET | ORAL | 0 refills | Status: DC

## 2015-10-22 NOTE — Telephone Encounter (Signed)
Pt contacted office for refill request on the following medications: HYDROcodone-acetaminophen (NORCO) 7.5-325 MG tablet.  Last written: 09/12/15 Last OV: 08/03/15  Please advise. Thanks TNP

## 2015-11-01 ENCOUNTER — Other Ambulatory Visit: Payer: Self-pay | Admitting: Family Medicine

## 2015-11-01 NOTE — Telephone Encounter (Signed)
Patient is requesting refill on diazepam (VALIUM) 10 MG tablet   [facility]  Cb# [phone]  Patient is completely out of medicine and also wants it called in

## 2015-11-02 ENCOUNTER — Ambulatory Visit: Payer: Self-pay | Admitting: Family Medicine

## 2015-11-02 MED ORDER — DIAZEPAM 10 MG PO TABS: 10.0000 mg | ORAL_TABLET | Freq: Every day | ORAL | 5 refills | Status: DC | PRN

## 2015-11-02 NOTE — Telephone Encounter (Signed)
Rx called in to pharmacy. 

## 2015-12-05 ENCOUNTER — Ambulatory Visit (INDEPENDENT_AMBULATORY_CARE_PROVIDER_SITE_OTHER): Payer: Commercial Managed Care - PPO | Admitting: Family Medicine

## 2015-12-05 ENCOUNTER — Encounter: Payer: Self-pay | Admitting: Family Medicine

## 2015-12-05 VITALS — BP 140/84 | HR 89 | Temp 98.0°F | Resp 16 | Ht 74.0 in | Wt 222.0 lb

## 2015-12-05 DIAGNOSIS — I739 Peripheral vascular disease, unspecified: Secondary | ICD-10-CM | POA: Diagnosis not present

## 2015-12-05 DIAGNOSIS — F419 Anxiety disorder, unspecified: Secondary | ICD-10-CM | POA: Diagnosis not present

## 2015-12-05 DIAGNOSIS — M25441 Effusion, right hand: Secondary | ICD-10-CM | POA: Diagnosis not present

## 2015-12-05 DIAGNOSIS — E119 Type 2 diabetes mellitus without complications: Secondary | ICD-10-CM | POA: Diagnosis not present

## 2015-12-05 DIAGNOSIS — G629 Polyneuropathy, unspecified: Secondary | ICD-10-CM | POA: Diagnosis not present

## 2015-12-05 MED ORDER — HYDROCODONE-ACETAMINOPHEN 7.5-325 MG PO TABS: ORAL_TABLET | ORAL | 0 refills | Status: DC

## 2015-12-05 MED ORDER — CILOSTAZOL 100 MG PO TABS: 100.0000 mg | ORAL_TABLET | Freq: Two times a day (BID) | ORAL | 5 refills | Status: DC | PRN

## 2015-12-05 NOTE — Patient Instructions (Signed)
Go to the [facility] on [address] [place] for right hand Xray

## 2015-12-05 NOTE — Progress Notes (Signed)
Patient: Dustin Guerra Male    DOB: Jul 23, 1955   60 y.o.   MRN: [id number] Visit Date: 12/05/2015  Today's Provider: Lelon Huh, MD   Chief Complaint  Patient presents with  . Follow-up  . Diabetes   Subjective:    HPI  Neuropathy ([place]): Follow up from 08/03/2015. Has no feeling in feet. Checks daily for wounds. Is taking gabapentin consistently which is effect for nerve pain.     Diabetes Mellitus Type II, Follow-up:   Lab Results  Component Value Date   HGBA1C 5.8 08/20/2012   HGBA1C 5.3 06/04/2012   Last seen for diabetes 3 months ago.  Management since then includes; no changes. He reports good compliance with treatment. He is not having side effects. none Current symptoms include none and have been unchanged. Home blood sugar records: fasting range: not checking  Episodes of hypoglycemia? no   Current Insulin Regimen: n/a Most Recent Eye Exam: due Weight trend: stable Prior visit with dietician: no Current diet: well balanced Current exercise: gym  ----------------------------------------------------------------  Complains of pain and swelling in right hand for the last 2-3 weeks. No particular injury. Unable to make a fist.   Also has been having pain and swelling in left great toe around fungal infected toenail. He does not have podiatrist.   Allergies  Allergen Reactions  . Codeine Itching     Current Outpatient Prescriptions:  .  aspirin 81 MG tablet, Take by mouth., Disp: , Rfl:  .  celecoxib (CELEBREX) 200 MG capsule, Take 1 capsule (200 mg total) by mouth 2 (two) times daily., Disp: 60 capsule, Rfl: 5 .  cilostazol (PLETAL) 100 MG tablet, Take 1 tablet (100 mg total) by mouth 2 (two) times daily as needed (for circulation)., Disp: 60 tablet, Rfl: 5 .  diazepam (VALIUM) 10 MG tablet, Take 1 tablet (10 mg total) by mouth daily as needed for anxiety., Disp: 30 tablet, Rfl: 5 .  docusate sodium (COLACE) 100 MG capsule, Take 100 mg  by mouth 2 (two) times daily., Disp: , Rfl:  .  EPINEPHrine (EPIPEN 2-PAK) 0.3 mg/0.3 mL IJ SOAJ injection, , Disp: , Rfl:  .  gabapentin (NEURONTIN) 300 MG capsule, Take 2 capsules (600 mg total) by mouth 3 (three) times daily., Disp: 180 capsule, Rfl: 5 .  HYDROcodone-acetaminophen (NORCO) 7.5-325 MG tablet, 1/2-1 tablet up to four times a day as needed, Disp: 120 tablet, Rfl: 0 .  IRON PO, Take by mouth., Disp: , Rfl:  .  lansoprazole (PREVACID) 15 MG capsule, Take 15 mg by mouth daily at 12 noon., Disp: , Rfl:  .  metroNIDAZOLE (METROGEL) 1 % gel, , Disp: , Rfl:  .  Multiple Vitamins-Minerals (CENTRUM SILVER) CHEW, Chew by mouth., Disp: , Rfl:  .  Omega-3-6-9 CAPS, Take by mouth 2 (two) times daily., Disp: , Rfl:  .  traMADol (ULTRAM) 50 MG tablet, Take 1 tablet (50 mg total) by mouth 3 (three) times daily as needed., Disp: 90 tablet, Rfl: 5  Review of Systems  Constitutional: Negative for appetite change, chills and fever.  Respiratory: Negative for chest tightness, shortness of breath and wheezing.   Cardiovascular: Negative for chest pain and palpitations.  Gastrointestinal: Negative for abdominal pain, nausea and vomiting.    Social History  Substance Use Topics  . Smoking status: Former Research scientist (life sciences)  . Smokeless tobacco: Not on file     Comment: quit 2000, smoked about 1.5 packs for about 5 years  . Alcohol  use No   Objective:   BP 140/84 (BP Location: Right Arm, Patient Position: Sitting, Cuff Size: Large)   Pulse 89   Temp 98 F (36.7 C) (Oral)   Resp 16   Ht 6\' 2"  (1.88 m)   Wt 222 lb (100.7 kg)   SpO2 98%   BMI 28.50 kg/m   Physical Exam   General Appearance:    Alert, cooperative, no distress  Eyes:    PERRL, conjunctiva/corneas clear, EOM's intact       Lungs:     Clear to auscultation bilaterally, respirations unlabored  Heart:    Regular rate and rhythm  Neurologic:   Awake, alert, oriented x 3. No apparent focal neurological           defect.   Skin:     Onychomycosis noted both feet with minimal swelling but no redness or discharge of surrounding skin.         Assessment & Plan:     1. Type 2 diabetes mellitus without complication, without long-term current use of insulin (HCC) Controlled off medications since 2015 - POCT glycosylated hemoglobin (Hb A1C) - Lipid panel - Renal function panel - Ambulatory referral to Podiatry  2. Neuropathy (HCC) Stable on current combination of gabapentin and prn hydrocodone/apap - HYDROcodone-acetaminophen (NORCO) 7.5-325 MG tablet; 1/2-1 tablet up to four times a day as needed  Dispense: 120 tablet; Refill: 0 - Ambulatory referral to Podiatry  3. Anxiety Continue prn valium   4. Swelling of joint of right hand  - DG Hand Complete Right; Future  5. PVD (peripheral vascular disease) (HCC) Continue Pletal - cilostazol (PLETAL) 100 MG tablet; Take 1 tablet (100 mg total) by mouth 2 (two) times daily as needed (for circulation).  Dispense: 60 tablet; Refill: 5       The entirety of the information documented in the History of Present Illness, Review of Systems and Physical Exam were personally obtained by me. Portions of this information were initially documented by Meyer Cory, CMA and reviewed by me for thoroughness and accuracy.    Lelon Huh, MD  [facility]

## 2015-12-07 DIAGNOSIS — I739 Peripheral vascular disease, unspecified: Secondary | ICD-10-CM | POA: Insufficient documentation

## 2015-12-07 LAB — POCT GLYCOSYLATED HEMOGLOBIN (HGB A1C)
ESTIMATED AVERAGE GLUCOSE: 126
HEMOGLOBIN A1C: 6

## 2015-12-10 ENCOUNTER — Ambulatory Visit
Admission: RE | Admit: 2015-12-10 | Discharge: 2015-12-10 | Disposition: A | Discharge status: Home / Self Care | Payer: Commercial Managed Care - PPO | Source: Ambulatory Visit | Attending: Family Medicine | Admitting: Family Medicine

## 2015-12-10 DIAGNOSIS — M25441 Effusion, right hand: Secondary | ICD-10-CM

## 2015-12-10 DIAGNOSIS — M19041 Primary osteoarthritis, right hand: Secondary | ICD-10-CM | POA: Diagnosis not present

## 2015-12-11 LAB — RENAL FUNCTION PANEL
Albumin: 4.8 g/dL (ref 3.5–5.5)
BUN / CREAT RATIO: 9 (ref 9–20)
BUN: 12 mg/dL (ref 6–24)
CALCIUM: 9.9 mg/dL (ref 8.7–10.2)
CHLORIDE: 97 mmol/L (ref 96–106)
CO2: 26 mmol/L (ref 18–29)
Creatinine, Ser: 1.32 mg/dL — ABNORMAL HIGH (ref 0.76–1.27)
GFR calc Af Amer: 68 mL/min/{1.73_m2} (ref >59)
GFR calc non Af Amer: 59 mL/min/{1.73_m2} — ABNORMAL LOW (ref >59)
Glucose: 97 mg/dL (ref 65–99)
POTASSIUM: 4.8 mmol/L (ref 3.5–5.2)
Phosphorus: 4.1 mg/dL (ref 2.5–4.5)
SODIUM: 139 mmol/L (ref 134–144)

## 2015-12-11 LAB — LIPID PANEL
CHOL/HDL RATIO: 4.3 ratio (ref 0.0–5.0)
Cholesterol, Total: 120 mg/dL (ref 100–199)
HDL: 28 mg/dL — ABNORMAL LOW (ref >39)
LDL CALC: 67 mg/dL (ref 0–99)
Triglycerides: 125 mg/dL (ref 0–149)
VLDL Cholesterol Cal: 25 mg/dL (ref 5–40)

## 2015-12-12 ENCOUNTER — Telehealth: Payer: Self-pay

## 2015-12-12 NOTE — Telephone Encounter (Signed)
Patient was notified of results. Patient expressed understanding. 

## 2015-12-12 NOTE — Telephone Encounter (Signed)
-----   Message from Birdie Sons, MD sent at 12/11/2015  9:15 PM EDT ----- Cholesterol is well controlled at 120. Kidney functions and blood sugar are normal. Continue current medications.  Xray of hand shows only arthritis. No treatment for this

## 2015-12-12 NOTE — Telephone Encounter (Signed)
Notes Recorded by Randal Buba, CMA on 12/12/2015 at 11:33 AM EDT Left message for patient to call back.

## 2015-12-21 ENCOUNTER — Ambulatory Visit: Payer: Self-pay | Admitting: Podiatry

## 2015-12-21 ENCOUNTER — Telehealth: Payer: Self-pay | Admitting: Family Medicine

## 2015-12-21 NOTE — Telephone Encounter (Signed)
Ena Dawley with [facility] [address] would like to follow up on the pt's prior auth for celecoxib (CELEBREX) 200 MG capsule. Please advise. Thanks TNP

## 2015-12-21 NOTE — Telephone Encounter (Signed)
PA was approved. Pharmacy was notified

## 2015-12-24 ENCOUNTER — Telehealth: Payer: Self-pay | Admitting: Family Medicine

## 2015-12-24 NOTE — Telephone Encounter (Signed)
Please advise patient that I have to fill out a prior authorization to try to get insurance to pay for Celebrex. I need to know what other medications he has tried before celebrex and what problems he had.

## 2015-12-24 NOTE — Telephone Encounter (Signed)
Patient states that medication was approved and he picked it up an hour ago. Patient wanted to give you an update he states that you referred him to podiatry and that when he got call for appt they require a $250 copay. Patient states that he can not afford to go. He states that he has had a toe fungus before and that he was prescribed Terbinafine 250mg  from a previous physician. Patient wants to know if you can prescribe medication to treat problem? If so he [facility] KW

## 2015-12-24 NOTE — Telephone Encounter (Signed)
[id number] 

## 2015-12-28 ENCOUNTER — Ambulatory Visit: Payer: Self-pay | Admitting: Podiatry

## 2015-12-30 MED ORDER — TERBINAFINE HCL 250 MG PO TABS: ORAL_TABLET | ORAL | 5 refills | Status: DC

## 2015-12-30 NOTE — Telephone Encounter (Signed)
[facility] [place]

## 2015-12-31 NOTE — Telephone Encounter (Signed)
Left detailed message on vm notifying pt rx was sent to pharmacy.

## 2016-01-04 ENCOUNTER — Ambulatory Visit: Payer: Self-pay | Admitting: Podiatry

## 2016-01-07 ENCOUNTER — Other Ambulatory Visit: Payer: Self-pay | Admitting: Family Medicine

## 2016-01-07 DIAGNOSIS — G629 Polyneuropathy, unspecified: Secondary | ICD-10-CM

## 2016-01-07 MED ORDER — HYDROCODONE-ACETAMINOPHEN 7.5-325 MG PO TABS: ORAL_TABLET | ORAL | 0 refills | Status: DC

## 2016-01-07 NOTE — Telephone Encounter (Signed)
Pt contacted office for refill request on the following medications: ° °HYDROcodone-acetaminophen (NORCO) 7.5-325 MG tablet.  CB#[phone]/MW °  ° °

## 2016-02-11 ENCOUNTER — Other Ambulatory Visit: Payer: Self-pay | Admitting: Family Medicine

## 2016-02-11 DIAGNOSIS — G629 Polyneuropathy, unspecified: Secondary | ICD-10-CM

## 2016-02-11 NOTE — Telephone Encounter (Signed)
Pt contacted office for refill request on the following medications: ° °HYDROcodone-acetaminophen (NORCO) 7.5-325 MG tablet.  CB#[phone]/MW °  ° °

## 2016-02-11 NOTE — Telephone Encounter (Signed)
[facility] 

## 2016-02-11 NOTE — Telephone Encounter (Signed)
Please call in tramadol.  

## 2016-02-11 NOTE — Telephone Encounter (Signed)
Rx called in to pharmacy. 

## 2016-02-12 MED ORDER — HYDROCODONE-ACETAMINOPHEN 7.5-325 MG PO TABS: ORAL_TABLET | ORAL | 0 refills | Status: DC

## 2016-03-19 ENCOUNTER — Telehealth: Payer: Self-pay | Admitting: Family Medicine

## 2016-03-19 DIAGNOSIS — G629 Polyneuropathy, unspecified: Secondary | ICD-10-CM

## 2016-03-19 MED ORDER — HYDROCODONE-ACETAMINOPHEN 7.5-325 MG PO TABS: ORAL_TABLET | ORAL | 0 refills | Status: DC

## 2016-03-19 NOTE — Telephone Encounter (Signed)
Pt contacted office for refill request on the following medications: ° °HYDROcodone-acetaminophen (NORCO) 7.5-325 MG tablet.  CB#[phone]/MW °  ° °

## 2016-03-28 ENCOUNTER — Other Ambulatory Visit: Payer: Self-pay | Admitting: Family Medicine

## 2016-03-28 NOTE — Telephone Encounter (Signed)
Pt is requesting a Rx for Viagra 100mg .  Pt states she was getting this from another doctor and he is no longer seeing this doctor.  [facility]  CB#[phone]/MW

## 2016-03-29 MED ORDER — SILDENAFIL CITRATE 100 MG PO TABS: 50.0000 mg | ORAL_TABLET | Freq: Every day | ORAL | 11 refills | Status: DC | PRN

## 2016-04-23 ENCOUNTER — Telehealth: Payer: Self-pay

## 2016-04-23 DIAGNOSIS — G629 Polyneuropathy, unspecified: Secondary | ICD-10-CM

## 2016-04-23 NOTE — Telephone Encounter (Signed)
Patient is requesting refill on HYDROcodone-acetaminophen (NORCO) 7.5-325 MG tablet. Last RF was 03/19/2016.  Patient is also requesting replacement  therapy for Celebrex. Due to insurance change RX is costing over $100. [facility]  CB#[phone] leave voicemail.

## 2016-04-24 MED ORDER — HYDROCODONE-ACETAMINOPHEN 7.5-325 MG PO TABS: ORAL_TABLET | ORAL | 0 refills | Status: DC

## 2016-04-24 MED ORDER — MELOXICAM 15 MG PO TABS: 15.0000 mg | ORAL_TABLET | Freq: Every day | ORAL | 5 refills | Status: DC

## 2016-04-26 ENCOUNTER — Other Ambulatory Visit: Payer: Self-pay | Admitting: Family Medicine

## 2016-04-26 DIAGNOSIS — G629 Polyneuropathy, unspecified: Secondary | ICD-10-CM

## 2016-05-21 ENCOUNTER — Other Ambulatory Visit: Payer: Self-pay | Admitting: Family Medicine

## 2016-05-21 NOTE — Telephone Encounter (Signed)
Please call in diazepam.  

## 2016-05-21 NOTE — Telephone Encounter (Signed)
[facility]  

## 2016-05-23 ENCOUNTER — Other Ambulatory Visit: Payer: Self-pay | Admitting: Family Medicine

## 2016-05-23 DIAGNOSIS — G629 Polyneuropathy, unspecified: Secondary | ICD-10-CM

## 2016-05-23 MED ORDER — HYDROCODONE-ACETAMINOPHEN 7.5-325 MG PO TABS: ORAL_TABLET | ORAL | 0 refills | Status: DC

## 2016-05-23 NOTE — Telephone Encounter (Signed)
Pt contacted office for refill request on the following medications:  HYDROcodone-acetaminophen (NORCO) 7.5-325 MG tablet.  CB#[phone]/MW

## 2016-06-03 ENCOUNTER — Ambulatory Visit: Payer: Commercial Managed Care - PPO | Admitting: Family Medicine

## 2016-06-06 ENCOUNTER — Ambulatory Visit (INDEPENDENT_AMBULATORY_CARE_PROVIDER_SITE_OTHER): Payer: Commercial Managed Care - PPO | Admitting: Family Medicine

## 2016-06-06 ENCOUNTER — Encounter: Payer: Self-pay | Admitting: Family Medicine

## 2016-06-06 VITALS — BP 152/86 | HR 88 | Temp 97.9°F | Resp 16 | Wt 234.0 lb

## 2016-06-06 DIAGNOSIS — E119 Type 2 diabetes mellitus without complications: Secondary | ICD-10-CM

## 2016-06-06 DIAGNOSIS — G629 Polyneuropathy, unspecified: Secondary | ICD-10-CM | POA: Diagnosis not present

## 2016-06-06 DIAGNOSIS — I83023 Varicose veins of left lower extremity with ulcer of ankle: Secondary | ICD-10-CM | POA: Diagnosis not present

## 2016-06-06 DIAGNOSIS — L97329 Non-pressure chronic ulcer of left ankle with unspecified severity: Secondary | ICD-10-CM

## 2016-06-06 DIAGNOSIS — I10 Essential (primary) hypertension: Secondary | ICD-10-CM

## 2016-06-06 LAB — POCT GLYCOSYLATED HEMOGLOBIN (HGB A1C): HEMOGLOBIN A1C: 6

## 2016-06-06 MED ORDER — AZELAIC ACID 15 % EX GEL: CUTANEOUS | 5 refills | Status: DC

## 2016-06-06 MED ORDER — SILVER SULFADIAZINE 1 % EX CREA: TOPICAL_CREAM | CUTANEOUS | 2 refills | Status: DC

## 2016-06-06 NOTE — Progress Notes (Signed)
Patient: Dustin Guerra Male    DOB: 1955-05-16   61 y.o.   MRN: [id number] Visit Date: 06/06/2016  Today's Provider: Lelon Huh, MD   Chief Complaint  Patient presents with  . PVD  . Diabetes  . Anxiety  . Neurologic Problem    neuropathy   Subjective:    HPI  Diabetes Mellitus Type II, Follow-up:   Lab Results  Component Value Date   HGBA1C 6.0 12/07/2015   HGBA1C 5.8 08/20/2012   HGBA1C 5.3 06/04/2012    Last seen for diabetes 6 months ago.  Management since then includes none. He reports good compliance with treatment. He is not having side effects.  Current symptoms include foot ulcerations and paresthesia of the feet and have been unchanged. Home blood sugar records: not checking  Pertinent Labs:    Component Value Date/Time   CHOL 120 12/10/2015 1612   CHOL 85 06/04/2012 0610   TRIG 125 12/10/2015 1612   TRIG 109 06/04/2012 0610   HDL 28 (L) 12/10/2015 1612   HDL 24 (L) 06/04/2012 0610   LDLCALC 67 12/10/2015 1612   LDLCALC 39 06/04/2012 0610   CREATININE 1.32 (H) 12/10/2015 1612   CREATININE 0.77 06/04/2012 0610    Wt Readings from Last 3 Encounters:  06/06/16 234 lb (106.1 kg)  12/05/15 222 lb (100.7 kg)  08/03/15 238 lb (108 kg)    ------------------------------------------------------------------------   Follow up for neuropathy  The patient was last seen for this 6 months ago. Changes made at last visit include none.  ------------------------------------------------------------------------------------  Follow up for Anxiety  The patient was last seen for this 6 months ago. Changes made at last visit include none.  ------------------------------------------------------------------------------------  Follow up for PVD  The patient was last seen for this 6 months ago. Changes made at last visit include none.  ------------------------------------------------------------------------------------ Pt reports that he  is doing well and feeling well. He is having trouble with his neuropathy in his feet. He has sore on his left foot he would like looked at today. Have there for a bout a month but he just started covering with protective bandages the last two weeks and lesions have improved significantly.      Allergies  Allergen Reactions  . Codeine Itching     Current Outpatient Prescriptions:  .  aspirin 81 MG tablet, Take by mouth., Disp: , Rfl:  .  Azelaic Acid (FINACEA) 15 % cream, Apply topically 2 (two) times daily. After skin is thoroughly washed and patted dry, gently but thoroughly massage a thin film of azelaic acid cream into the affected area twice daily, in the morning and evening., Disp: , Rfl:  .  cilostazol (PLETAL) 100 MG tablet, Take 1 tablet (100 mg total) by mouth 2 (two) times daily as needed (for circulation)., Disp: 60 tablet, Rfl: 5 .  diazepam (VALIUM) 10 MG tablet, TAKE ONE TABLET BY MOUTH DAILY AS NEEDED FOR ANXIETY, Disp: 30 tablet, Rfl: 5 .  docusate sodium (COLACE) 100 MG capsule, Take 100 mg by mouth 2 (two) times daily., Disp: , Rfl:  .  gabapentin (NEURONTIN) 300 MG capsule, TAKE 2 CAPSULES (600 MG TOTAL) BY MOUTH 3 (THREE) TIMES DAILY., Disp: 180 capsule, Rfl: 5 .  HYDROcodone-acetaminophen (NORCO) 7.5-325 MG tablet, 1/2-1 tablet up to four times a day as needed, Disp: 120 tablet, Rfl: 0 .  IRON PO, Take by mouth., Disp: , Rfl:  .  lansoprazole (PREVACID) 15 MG capsule, Take 15 mg by  mouth daily at 12 noon., Disp: , Rfl:  .  meloxicam (MOBIC) 15 MG tablet, Take 1 tablet (15 mg total) by mouth daily., Disp: 30 tablet, Rfl: 5 .  Multiple Vitamins-Minerals (CENTRUM SILVER) CHEW, Chew by mouth., Disp: , Rfl:  .  sildenafil (VIAGRA) 100 MG tablet, Take 0.5-1 tablets (50-100 mg total) by mouth daily as needed for erectile dysfunction., Disp: 5 tablet, Rfl: 11 .  terbinafine (LAMISIL) 250 MG tablet, Take 2 (two) tablets daily for 7 days out of each month, Disp: 14 tablet, Rfl: 5 .   traMADol (ULTRAM) 50 MG tablet, TAKE 1 TABLET BY MOUTH 3 TIMES A DAY AS NEEDED, Disp: 90 tablet, Rfl: 3 .  EPINEPHrine (EPIPEN 2-PAK) 0.3 mg/0.3 mL IJ SOAJ injection, , Disp: , Rfl:  .  metroNIDAZOLE (METROGEL) 1 % gel, , Disp: , Rfl:  .  Omega-3-6-9 CAPS, Take by mouth 2 (two) times daily., Disp: , Rfl:   Review of Systems  Constitutional: Negative.   HENT: Negative.   Eyes: Negative.   Respiratory: Negative.   Cardiovascular: Negative.   Gastrointestinal: Negative.   Endocrine: Negative.   Genitourinary: Negative.   Musculoskeletal: Negative.   Skin: Positive for wound (left foot).  Allergic/Immunologic: Negative.   Neurological: Positive for numbness.  Hematological: Negative.   Psychiatric/Behavioral: Negative.     Social History  Substance Use Topics  . Smoking status: Former Research scientist (life sciences)  . Smokeless tobacco: Never Used     Comment: quit 2000, smoked about 1.5 packs for about 5 years  . Alcohol use No   Objective:   BP (!) 152/86 (BP Location: Left Arm, Patient Position: Sitting, Cuff Size: Large)   Pulse 88   Temp 97.9 F (36.6 C) (Oral)   Resp 16   Wt 234 lb (106.1 kg)   SpO2 95%   BMI 30.04 kg/m  Vitals:   06/06/16 1642  BP: (!) 152/86  Pulse: 88  Resp: 16  Temp: 97.9 F (36.6 C)  TempSrc: Oral  SpO2: 95%  Weight: 234 lb (106.1 kg)   Depression screen [facility] 2/9 06/06/2016  Decreased Interest 0  Down, Depressed, Hopeless 0  PHQ - 2 Score 0  Altered sleeping 0  Tired, decreased energy 0  Change in appetite 0  Feeling bad or failure about yourself  0  Trouble concentrating 1  Moving slowly or fidgety/restless 0  Suicidal thoughts 0  PHQ-9 Score 1     Physical Exam   General Appearance:    Alert, cooperative, no distress  HENT:   ENT exam normal, no neck nodes or sinus tenderness  Eyes:    PERRL, conjunctiva/corneas clear, EOM's intact       Lungs:     Clear to auscultation bilaterally, respirations unlabored  Heart:    Regular rate and rhythm    Neurologic:   Awake, alert, oriented x 3. No apparent focal neurological           defect.   Vasc:  Severe varicosities. +3 pedal pulses. Cap refill secondary Shallow ulcerations left medial and lateral malleolus. Scant yellow discharge.      Results for orders placed or performed in visit on 06/06/16  POCT HgB A1C  Result Value Ref Range   Hemoglobin A1C 6.0        Assessment & Plan:     1. Type 2 diabetes mellitus without complication, without long-term current use of insulin (HCC) Diet controlled Continue current medications.   - POCT HgB A1C  2. Essential hypertension Well  controlled.  Continue current medications.    3. Neuropathy (HCC) Severe.Continue current medications.    4. Venous stasis ulcer of left ankle with varicose veins, unspecified ulcer stage ([place]) Improving since covering with protective bandages which he was encourage to continue using. Also to start applying silvadene cream daily. Good pedal pulses and cap refill on exam. Call if lesions not healed in 10-14 days. Otherwise follow up 6 months.        Lelon Huh, MD  [facility]

## 2016-06-09 ENCOUNTER — Other Ambulatory Visit: Payer: Self-pay | Admitting: Family Medicine

## 2016-06-09 ENCOUNTER — Encounter: Payer: Self-pay | Admitting: Family Medicine

## 2016-06-09 ENCOUNTER — Telehealth: Payer: Self-pay

## 2016-06-09 DIAGNOSIS — M25542 Pain in joints of left hand: Secondary | ICD-10-CM | POA: Insufficient documentation

## 2016-06-09 DIAGNOSIS — M25541 Pain in joints of right hand: Secondary | ICD-10-CM | POA: Insufficient documentation

## 2016-06-09 MED ORDER — INDOMETHACIN 50 MG PO CAPS: 50.0000 mg | ORAL_CAPSULE | Freq: Two times a day (BID) | ORAL | 3 refills | Status: DC

## 2016-06-09 NOTE — Telephone Encounter (Signed)
Pharmacy is requesting Indomethacin 25mg  capsules. They report that the patient has arthritis in his hands and has taken this before in the past.

## 2016-06-20 ENCOUNTER — Other Ambulatory Visit: Payer: Self-pay | Admitting: Family Medicine

## 2016-06-20 DIAGNOSIS — G629 Polyneuropathy, unspecified: Secondary | ICD-10-CM

## 2016-06-20 MED ORDER — HYDROCODONE-ACETAMINOPHEN 7.5-325 MG PO TABS: ORAL_TABLET | ORAL | 0 refills | Status: DC

## 2016-06-20 NOTE — Telephone Encounter (Signed)
Rx refilled.

## 2016-06-20 NOTE — Telephone Encounter (Signed)
Pt contacted office for refill request on the following medications:  HYDROcodone-acetaminophen (NORCO) 7.5-325 MG tablet.  CB#[phone]/MW  This is a pt of Dr Opal Sidles

## 2016-06-22 NOTE — Telephone Encounter (Signed)
Please call in tramadol.  

## 2016-06-23 NOTE — Telephone Encounter (Signed)
traMADol (ULTRAM) 50 MG tablet   06/22/16 -- Birdie Sons, MD    TAKE 1 TABLET BY MOUTH 3 TIMES A DAY AS NEEDED

## 2016-06-23 NOTE — Telephone Encounter (Signed)
Rx called in to pharmacy. 

## 2016-07-01 ENCOUNTER — Other Ambulatory Visit: Payer: Self-pay | Admitting: Family Medicine

## 2016-07-01 DIAGNOSIS — I739 Peripheral vascular disease, unspecified: Secondary | ICD-10-CM

## 2016-07-17 ENCOUNTER — Telehealth: Payer: Self-pay | Admitting: Family Medicine

## 2016-07-17 NOTE — Telephone Encounter (Signed)
Dustin Guerra called regarding pt rx for   indomethacin (INDOCIN) 50 MG capsule  The patient is using 2 pills day which is causing him to rumn out around day 15.  He wants to know if he can get enough to last for 30 days.  Please advise pharmacy.  [phone]  Thanks [name]

## 2016-07-18 ENCOUNTER — Other Ambulatory Visit: Payer: Self-pay | Admitting: Family Medicine

## 2016-07-18 DIAGNOSIS — G629 Polyneuropathy, unspecified: Secondary | ICD-10-CM

## 2016-07-18 MED ORDER — HYDROCODONE-ACETAMINOPHEN 7.5-325 MG PO TABS: ORAL_TABLET | ORAL | 0 refills | Status: DC

## 2016-07-18 NOTE — Telephone Encounter (Signed)
Indomethacin RX was sent in for 30 tablets with sig of 1 po BID. Can we correct that. Please review. Thank you-aa

## 2016-07-18 NOTE — Telephone Encounter (Signed)
Last fill was 06/20/16-aa

## 2016-07-18 NOTE — Telephone Encounter (Signed)
Pt needs refill on his   HYDROcodone-acetaminophen (NORCO) 7.5-325 MG tablet  He would like to pick up this afternoon.Hoyt Koch

## 2016-07-23 ENCOUNTER — Ambulatory Visit (INDEPENDENT_AMBULATORY_CARE_PROVIDER_SITE_OTHER): Payer: Commercial Managed Care - PPO | Admitting: Family Medicine

## 2016-07-23 ENCOUNTER — Encounter: Payer: Self-pay | Admitting: Family Medicine

## 2016-07-23 VITALS — BP 170/98 | HR 82 | Temp 98.2°F | Resp 16 | Ht 74.0 in | Wt 222.0 lb

## 2016-07-23 DIAGNOSIS — E119 Type 2 diabetes mellitus without complications: Secondary | ICD-10-CM | POA: Diagnosis not present

## 2016-07-23 DIAGNOSIS — I10 Essential (primary) hypertension: Secondary | ICD-10-CM

## 2016-07-23 DIAGNOSIS — I83023 Varicose veins of left lower extremity with ulcer of ankle: Secondary | ICD-10-CM | POA: Diagnosis not present

## 2016-07-23 DIAGNOSIS — L97329 Non-pressure chronic ulcer of left ankle with unspecified severity: Secondary | ICD-10-CM | POA: Diagnosis not present

## 2016-07-23 DIAGNOSIS — I8393 Asymptomatic varicose veins of bilateral lower extremities: Secondary | ICD-10-CM

## 2016-07-23 LAB — POCT GLUCOSE (DEVICE FOR HOME USE): POC Glucose: 164 mg/dl — AB (ref 70–99)

## 2016-07-23 MED ORDER — VALSARTAN 80 MG PO TABS: 80.0000 mg | ORAL_TABLET | Freq: Every day | ORAL | 2 refills | Status: DC

## 2016-07-23 MED ORDER — INDOMETHACIN 50 MG PO CAPS: 50.0000 mg | ORAL_CAPSULE | Freq: Two times a day (BID) | ORAL | 5 refills | Status: DC

## 2016-07-23 NOTE — Progress Notes (Signed)
Patient: Dustin Guerra Male    DOB: [birth year]   61 y.o.   MRN: [id number] Visit Date: 07/23/2016  Today's Provider: Lelon Huh, MD   Chief Complaint  Patient presents with  . Sore   Subjective:    Sore on left foot has worsened sine his visit on 06/06/2016.   Was thought to be due to venous stasis at last visit and recommend routine wound care and to return if was not healing  within 2 weeks. Lesions have gotten larger since last visit. He does have severe neuropathy, but wounds are not painful.   He is also noted to have increasingly elevated BP over the last several months. He does have history of hypertension, but has been off of medications since stopping alcohol consumption.  BP Readings from Last 3 Encounters:  07/23/16 (!) 170/98  06/06/16 (!) 152/86  12/05/15 140/84       Allergies  Allergen Reactions  . Codeine Itching     Current Outpatient Prescriptions:  .  aspirin 81 MG tablet, Take by mouth., Disp: , Rfl:  .  Azelaic Acid (FINACEA) 15 % cream, After skin is thoroughly washed and patted dry, gently but thoroughly massage a thin film of azelaic acid cream into the affected area twice daily, in the morning and evening., Disp: 50 g, Rfl: 5 .  cilostazol (PLETAL) 100 MG tablet, TAKE 1 TABLET (100 MG TOTAL) BY MOUTH 2 (TWO) TIMES DAILY AS NEEDED (FOR CIRCULATION)., Disp: 60 tablet, Rfl: 5 .  diazepam (VALIUM) 10 MG tablet, TAKE ONE TABLET BY MOUTH DAILY AS NEEDED FOR ANXIETY, Disp: 30 tablet, Rfl: 5 .  docusate sodium (COLACE) 100 MG capsule, Take 100 mg by mouth 2 (two) times daily., Disp: , Rfl:  .  EPINEPHrine (EPIPEN 2-PAK) 0.3 mg/0.3 mL IJ SOAJ injection, , Disp: , Rfl:  .  gabapentin (NEURONTIN) 300 MG capsule, TAKE 2 CAPSULES (600 MG TOTAL) BY MOUTH 3 (THREE) TIMES DAILY., Disp: 180 capsule, Rfl: 5 .  HYDROcodone-acetaminophen (NORCO) 7.5-325 MG tablet, 1/2-1 tablet up to four times a day as needed, Disp: 120 tablet, Rfl: 0 .  indomethacin  (INDOCIN) 50 MG capsule, Take 1 capsule (50 mg total) by mouth 2 (two) times daily with a meal. As needed for pain. Do NOT take in addition to meloxicam, Disp: 60 capsule, Rfl: 5 .  IRON PO, Take by mouth., Disp: , Rfl:  .  lansoprazole (PREVACID) 15 MG capsule, Take 15 mg by mouth daily at 12 noon., Disp: , Rfl:  .  metroNIDAZOLE (METROGEL) 1 % gel, , Disp: , Rfl:  .  Multiple Vitamins-Minerals (CENTRUM SILVER) CHEW, Chew by mouth., Disp: , Rfl:  .  Omega-3-6-9 CAPS, Take by mouth 2 (two) times daily., Disp: , Rfl:  .  sildenafil (VIAGRA) 100 MG tablet, Take 0.5-1 tablets (50-100 mg total) by mouth daily as needed for erectile dysfunction., Disp: 5 tablet, Rfl: 11 .  silver sulfADIAZINE (SILVADENE) 1 % cream, Apply to affected area daily, Disp: 50 g, Rfl: 2 .  terbinafine (LAMISIL) 250 MG tablet, Take 2 (two) tablets daily for 7 days out of each month, Disp: 14 tablet, Rfl: 5 .  traMADol (ULTRAM) 50 MG tablet, TAKE 1 TABLET BY MOUTH 3 TIMES A DAY AS NEEDED, Disp: 90 tablet, Rfl: 5  Review of Systems  Constitutional: Negative for appetite change, chills and fever.  Respiratory: Negative for chest tightness, shortness of breath and wheezing.   Cardiovascular: Negative for chest pain  and palpitations.  Gastrointestinal: Negative for abdominal pain, nausea and vomiting.    Social History  Substance Use Topics  . Smoking status: Former Research scientist (life sciences)  . Smokeless tobacco: Never Used     Comment: quit 2000, smoked about 1.5 packs for about 5 years  . Alcohol use No   Objective:   BP (!) 170/102 (BP Location: Right Arm, Patient Position: Sitting, Cuff Size: Large)   Pulse 82   Temp 98.2 F (36.8 C) (Oral)   Resp 16   Ht 6\' 2"  (1.88 m)   Wt 222 lb (100.7 kg)   SpO2 98%   BMI 28.50 kg/m  Vitals:   07/23/16 1617  BP: (!) 170/102  Pulse: 82  Resp: 16  Temp: 98.2 F (36.8 C)  TempSrc: Oral  SpO2: 98%  Weight: 222 lb (100.7 kg)  Height: 6\' 2"  (1.88 m)     Physical Exam   General  Appearance:    Alert, cooperative, no distress  HENT:   ENT exam normal, no neck nodes or sinus tenderness  Eyes:    PERRL, conjunctiva/corneas clear, EOM's intact       Lungs:     Clear to auscultation bilaterally, respirations unlabored  Heart:    Regular rate and rhythm  Neurologic:   Awake, alert, oriented x 3. No apparent focal neurological           defect.   Vasc:  Severe varicosities. +3 pedal pulses. Cap refill 2 seconds. Shallow ulcerations left medial and lateral malleolus doubled in sized from visit on 3/23. Scant yellow discharge.    Results for orders placed or performed in visit on 07/23/16  POCT Glucose (Device for Home Use)  Result Value Ref Range   POC Glucose 164 (A) 70 - 99 mg/dl       Assessment & Plan:      1. Varicose veins of both lower extremities  - Ambulatory referral to Vascular Surgery  2. Venous stasis ulcer of left ankle with varicose veins, unspecified ulcer stage ([place]) He has good pedal pulses, suspect wounds are more related to venous than arterial insufficieny. Have vascular take a look. Consider referral to wound clinic after vascular evaluation.  - Ambulatory referral to Vascular Surgery  3. Essential hypertension Worsening for several months. He states he took benicar in the past. Will start on generic ARB. Recheck in a month - valsartan (DIOVAN) 80 MG tablet; Take 1 tablet (80 mg total) by mouth daily.  Dispense: 30 tablet; Refill: 2  4. Type 2 diabetes mellitus without complication, without long-term current use of insulin (HCC) Currently diet controlle.d  - POCT Glucose (Device for Home Use)       Lelon Huh, MD  [facility]

## 2016-07-30 ENCOUNTER — Encounter (INDEPENDENT_AMBULATORY_CARE_PROVIDER_SITE_OTHER): Payer: Self-pay | Admitting: Vascular Surgery

## 2016-07-30 ENCOUNTER — Other Ambulatory Visit: Payer: Self-pay | Admitting: Family Medicine

## 2016-07-30 ENCOUNTER — Ambulatory Visit (INDEPENDENT_AMBULATORY_CARE_PROVIDER_SITE_OTHER): Payer: Commercial Managed Care - PPO | Admitting: Vascular Surgery

## 2016-07-30 VITALS — BP 133/75 | HR 84 | Resp 17 | Ht 74.0 in | Wt 225.0 lb

## 2016-07-30 DIAGNOSIS — I8393 Asymptomatic varicose veins of bilateral lower extremities: Secondary | ICD-10-CM

## 2016-07-30 DIAGNOSIS — E119 Type 2 diabetes mellitus without complications: Secondary | ICD-10-CM | POA: Diagnosis not present

## 2016-07-30 NOTE — Progress Notes (Signed)
Subjective:    Patient ID: Derrel Moore, male    DOB: [birth year], 61 y.o.   MRN: [id number] Chief Complaint  Patient presents with  . New Evaluation    Varicose Veins   Presents as a new patient referred by Dr. Caryn Section for bilateral ankle ulcerations. Patient endorses a history of bilateral ankle wounds for about two months. RLE: ulcerations almost healed. LLE: Medial / Lateral ulceration - limited to skin. States almost heals then worsens. Patient states what wounds like bilateral vein stripping in his 20's. Patient stands for hours on his feet at work. He does not wear compression or engage in elevation at this time. No claudication or rest pain. Local wound care with silvadene. Denies fever, nausea or vomiting. States swelling especially in the large bilateral varicose veins - worse at the end of the day.   Review of Systems  Constitutional: Negative.   HENT: Negative.   Eyes: Negative.   Respiratory: Negative.   Cardiovascular: Positive for leg swelling.       Painful varicose veins  Gastrointestinal: Negative.   Endocrine: Negative.   Genitourinary: Negative.   Musculoskeletal: Negative.   Skin:       Ankle Ulcerations  Allergic/Immunologic: Negative.   Neurological: Negative.   Hematological: Negative.   Psychiatric/Behavioral: Negative.       Objective:   Physical Exam  Constitutional: He is oriented to person, place, and time. He appears well-developed and well-nourished. No distress.  HENT:  Head: Normocephalic and atraumatic.  Eyes: Conjunctivae are normal. Pupils are equal, round, and reactive to light.  Neck: Normal range of motion.  Cardiovascular: Normal rate, regular rhythm and normal heart sounds.   Pulses:      Radial pulses are 2+ on the right side, and 2+ on the left side.       Dorsalis pedis pulses are 1+ on the right side, and 1+ on the left side.       Posterior tibial pulses are 1+ on the right side, and 1+ on the left side.    Pulmonary/Chest: Effort normal.  Musculoskeletal: Normal range of motion. He exhibits edema (Mild bilateral edema).  Neurological: He is alert and oriented to person, place, and time.  Skin: He is not diaphoretic.     Right Medial Ankle: Skin almost healed Left Medial Ankle: Swallow, Limited to Skin, Non-infected. Lateral Left Ankle: Swallow, Limited to Skin, Non-infected No cellulitis.   Psychiatric: He has a normal mood and affect. His behavior is normal. Judgment and thought content normal.  Vitals reviewed.  BP 133/75 (BP Location: Right Arm)   Pulse 84   Resp 17   Ht 6\' 2"  (1.88 m)   Wt 225 lb (102.1 kg)   BMI 28.89 kg/m   Past Medical History:  Diagnosis Date  . Anxiety   . Hypertension    Social History   Social History  . Marital status: Single    Spouse name: N/A  . Number of children: N/A  . Years of education: N/A   Occupational History  . Not on file.   Social History Main Topics  . Smoking status: Former Research scientist (life sciences)  . Smokeless tobacco: Never Used     Comment: quit 2000, smoked about 1.5 packs for about 5 years  . Alcohol use No  . Drug use: No  . Sexual activity: Not on file   Other Topics Concern  . Not on file   Social History Narrative  . No narrative on file  Past Surgical History:  Procedure Laterality Date  . BACK SURGERY    . COLON SURGERY  2015   Colon resection due to diverticulosis  . VEIN LIGATION AND STRIPPING    . VEIN SURGERY     Vein stripping at age 14-27   Family History  Problem Relation Age of Onset  . Alcohol abuse Mother   . Esophageal cancer Father   . Heart failure Brother    Allergies  Allergen Reactions  . Codeine Itching      Assessment & Plan:  Presents as a new patient referred by Dr. Caryn Section for bilateral ankle ulcerations. Patient endorses a history of bilateral ankle wounds for about two months. RLE: ulcerations almost healed. LLE: Medial / Lateral ulceration - limited to skin. States almost heals then  worsens. Patient states what wounds like bilateral vein stripping in his 20's. Patient stands for hours on his feet at work. He does not wear compression or engage in elevation at this time. No claudication or rest pain. Local wound care with silvadene. Denies fever, nausea or vomiting. States swelling especially in the large bilateral varicose veins - worse at the end of the day.  1. Varicose veins of both lower extremities with Ulceration - New Long standing history of worsening bilateral varicose veins Now with L>R ankle ulcerations Will place in unna boots for compression and to allow ulceration to heal. Will order bilateral venous dupelx to assess for reflux Patient start engaging in elevation.  - VAS [place] LOWER EXTREMITY VENOUS REFLUX; Future  2. Type 2 diabetes mellitus without complication, without long-term current use of insulin (HCC) - Satble Encouraged good control as its slows the progression of atherosclerotic disease  Current Outpatient Prescriptions on File Prior to Visit  Medication Sig Dispense Refill  . aspirin 81 MG tablet Take by mouth.    . Azelaic Acid (FINACEA) 15 % cream After skin is thoroughly washed and patted dry, gently but thoroughly massage a thin film of azelaic acid cream into the affected area twice daily, in the morning and evening. 50 g 5  . cilostazol (PLETAL) 100 MG tablet TAKE 1 TABLET (100 MG TOTAL) BY MOUTH 2 (TWO) TIMES DAILY AS NEEDED (FOR CIRCULATION). 60 tablet 5  . diazepam (VALIUM) 10 MG tablet TAKE ONE TABLET BY MOUTH DAILY AS NEEDED FOR ANXIETY 30 tablet 5  . docusate sodium (COLACE) 100 MG capsule Take 100 mg by mouth 2 (two) times daily.    Marland Kitchen EPINEPHrine (EPIPEN 2-PAK) 0.3 mg/0.3 mL IJ SOAJ injection     . gabapentin (NEURONTIN) 300 MG capsule TAKE 2 CAPSULES (600 MG TOTAL) BY MOUTH 3 (THREE) TIMES DAILY. 180 capsule 5  . HYDROcodone-acetaminophen (NORCO) 7.5-325 MG tablet 1/2-1 tablet up to four times a day as needed 120 tablet 0  .  indomethacin (INDOCIN) 50 MG capsule Take 1 capsule (50 mg total) by mouth 2 (two) times daily with a meal. As needed for pain. Do NOT take in addition to meloxicam 60 capsule 5  . IRON PO Take by mouth.    . lansoprazole (PREVACID) 15 MG capsule Take 15 mg by mouth daily at 12 noon.    . metroNIDAZOLE (METROGEL) 1 % gel     . Multiple Vitamins-Minerals (CENTRUM SILVER) CHEW Chew by mouth.    . Omega-3-6-9 CAPS Take by mouth 2 (two) times daily.    . sildenafil (VIAGRA) 100 MG tablet Take 0.5-1 tablets (50-100 mg total) by mouth daily as needed for erectile dysfunction. 5 tablet  11  . silver sulfADIAZINE (SILVADENE) 1 % cream Apply to affected area daily 50 g 2  . terbinafine (LAMISIL) 250 MG tablet Take 2 (two) tablets daily for 7 days out of each month 14 tablet 5  . traMADol (ULTRAM) 50 MG tablet TAKE 1 TABLET BY MOUTH 3 TIMES A DAY AS NEEDED 90 tablet 5  . valsartan (DIOVAN) 80 MG tablet Take 1 tablet (80 mg total) by mouth daily. 30 tablet 2   No current facility-administered medications on file prior to visit.     There are no Patient Instructions on file for this visit. No Follow-up on file.   Chloeanne Poteet A Destane Speas, PA-C

## 2016-07-30 NOTE — Telephone Encounter (Signed)
Patient was using [facility] and they gave him foam in stead of cream for Azelaic Acid (FINACEA) 15 %.  He has transferred all of his medications to [facility] and they said that he needs a new prescription for Azelaic Acid (FINACEA) 15 % cream because he does not want the foam.

## 2016-07-31 ENCOUNTER — Other Ambulatory Visit: Payer: Self-pay | Admitting: Family Medicine

## 2016-07-31 MED ORDER — AZELAIC ACID 15 % EX GEL: CUTANEOUS | 5 refills | Status: DC

## 2016-08-06 ENCOUNTER — Encounter (INDEPENDENT_AMBULATORY_CARE_PROVIDER_SITE_OTHER): Payer: Commercial Managed Care - PPO

## 2016-08-06 DIAGNOSIS — I890 Lymphedema, not elsewhere classified: Secondary | ICD-10-CM

## 2016-08-13 ENCOUNTER — Ambulatory Visit (INDEPENDENT_AMBULATORY_CARE_PROVIDER_SITE_OTHER): Payer: Commercial Managed Care - PPO | Admitting: Vascular Surgery

## 2016-08-13 ENCOUNTER — Encounter (INDEPENDENT_AMBULATORY_CARE_PROVIDER_SITE_OTHER): Payer: Self-pay

## 2016-08-13 VITALS — BP 148/93 | HR 80 | Resp 16

## 2016-08-13 DIAGNOSIS — R600 Localized edema: Secondary | ICD-10-CM | POA: Diagnosis not present

## 2016-08-13 NOTE — Progress Notes (Signed)
History of Present Illness  There is no documented history at this time  Assessments & Plan   There are no diagnoses linked to this encounter.    Additional instructions  Subjective:  Patient presents with venous ulcer of the Left lower extremity.    Procedure:  3 layer unna wrap was placed Left lower extremity.   Plan:   Follow up in one week.  

## 2016-08-15 ENCOUNTER — Other Ambulatory Visit: Payer: Self-pay | Admitting: Family Medicine

## 2016-08-15 DIAGNOSIS — G629 Polyneuropathy, unspecified: Secondary | ICD-10-CM

## 2016-08-15 MED ORDER — HYDROCODONE-ACETAMINOPHEN 7.5-325 MG PO TABS: ORAL_TABLET | ORAL | 0 refills | Status: DC

## 2016-08-15 NOTE — Telephone Encounter (Signed)
Last fill 07/18/16-aa

## 2016-08-15 NOTE — Telephone Encounter (Signed)
Pt contacted office for refill request on the following medications:  HYDROcodone-acetaminophen (NORCO) 7.5-325 MG tablet.  CB#[phone]/MW

## 2016-08-15 NOTE — Telephone Encounter (Signed)
Patient needs office visit before next refill of hydrocodone/apap

## 2016-08-20 ENCOUNTER — Encounter: Payer: Self-pay | Admitting: Family Medicine

## 2016-08-20 ENCOUNTER — Encounter (INDEPENDENT_AMBULATORY_CARE_PROVIDER_SITE_OTHER): Payer: Commercial Managed Care - PPO

## 2016-08-20 ENCOUNTER — Ambulatory Visit (INDEPENDENT_AMBULATORY_CARE_PROVIDER_SITE_OTHER): Payer: Commercial Managed Care - PPO | Admitting: Family Medicine

## 2016-08-20 ENCOUNTER — Encounter (INDEPENDENT_AMBULATORY_CARE_PROVIDER_SITE_OTHER): Payer: Self-pay | Admitting: Vascular Surgery

## 2016-08-20 VITALS — BP 132/74 | HR 98 | Temp 97.9°F | Resp 16 | Wt 224.0 lb

## 2016-08-20 DIAGNOSIS — Z79899 Other long term (current) drug therapy: Secondary | ICD-10-CM | POA: Diagnosis not present

## 2016-08-20 DIAGNOSIS — I83023 Varicose veins of left lower extremity with ulcer of ankle: Secondary | ICD-10-CM | POA: Diagnosis not present

## 2016-08-20 DIAGNOSIS — G629 Polyneuropathy, unspecified: Secondary | ICD-10-CM

## 2016-08-20 DIAGNOSIS — I10 Essential (primary) hypertension: Secondary | ICD-10-CM | POA: Diagnosis not present

## 2016-08-20 DIAGNOSIS — F1190 Opioid use, unspecified, uncomplicated: Secondary | ICD-10-CM | POA: Diagnosis not present

## 2016-08-20 DIAGNOSIS — L97329 Non-pressure chronic ulcer of left ankle with unspecified severity: Secondary | ICD-10-CM | POA: Diagnosis not present

## 2016-08-20 NOTE — Progress Notes (Signed)
Patient: Dustin Guerra Male    DOB: 10-06-1955   61 y.o.   MRN: [id number] Visit Date: 08/20/2016  Today's Provider: Lelon Huh, MD   Chief Complaint  Patient presents with  . Follow-up  . Hypertension   Subjective:    HPI  Hypertension, follow-up:  BP Readings from Last 3 Encounters:  08/13/16 (!) 148/93  07/30/16 133/75  07/23/16 (!) 170/98    He was last seen for hypertension 1 months ago.  BP at that visit was 170/102. Management since that visit includes starting generic ARB (Valsartan). He reports good compliance with treatment. He is not having side effects.  He is exercising. He is adherent to low salt diet.   Outside blood pressures are 138/89. He is experiencing none.  Patient denies chest pain, chest pressure/discomfort, claudication, dyspnea, exertional chest pressure/discomfort, fatigue, irregular heart beat, lower extremity edema, near-syncope, orthopnea, palpitations, paroxysmal nocturnal dyspnea, syncope and tachypnea.   Cardiovascular risk factors include advanced age (older than 14 for men, 74 for women), hypertension and male gender.  Use of agents associated with hypertension: NSAIDS.     Weight trend: fluctuating a bit Wt Readings from Last 3 Encounters:  07/30/16 225 lb (102.1 kg)  07/23/16 222 lb (100.7 kg)  06/06/16 234 lb (106.1 kg)    Current diet: in general, a "healthy" diet    ------------------------------------------------------------------------ Follow up of Neuropathy:  Patient was last seen for this problem 3 months ago and no changes were made. Patient reports good compliance with treatment, good tolerance and good symptom control. He is taking hydrocodone/apap consistently three times on most day and finds it fairly effective. Denies any adverse effects.     Allergies  Allergen Reactions  . Codeine Itching     Current Outpatient Prescriptions:  .  aspirin 81 MG tablet, Take by mouth., Disp: , Rfl:  .   Azelaic Acid (FINACEA) 15 % cream, After skin is thoroughly washed and patted dry, gently but thoroughly massage a thin film of azelaic acid cream into the affected area twice daily, in the morning and evening., Disp: 50 g, Rfl: 5 .  cilostazol (PLETAL) 100 MG tablet, TAKE 1 TABLET (100 MG TOTAL) BY MOUTH 2 (TWO) TIMES DAILY AS NEEDED (FOR CIRCULATION)., Disp: 60 tablet, Rfl: 5 .  diazepam (VALIUM) 10 MG tablet, TAKE ONE TABLET BY MOUTH DAILY AS NEEDED FOR ANXIETY, Disp: 30 tablet, Rfl: 5 .  docusate sodium (COLACE) 100 MG capsule, Take 100 mg by mouth 2 (two) times daily., Disp: , Rfl:  .  EPINEPHrine (EPIPEN 2-PAK) 0.3 mg/0.3 mL IJ SOAJ injection, , Disp: , Rfl:  .  gabapentin (NEURONTIN) 300 MG capsule, TAKE 2 CAPSULES (600 MG TOTAL) BY MOUTH 3 (THREE) TIMES DAILY., Disp: 180 capsule, Rfl: 5 .  HYDROcodone-acetaminophen (NORCO) 7.5-325 MG tablet, 1/2-1 tablet up to four times a day as needed, Disp: 120 tablet, Rfl: 0 .  indomethacin (INDOCIN) 50 MG capsule, Take 1 capsule (50 mg total) by mouth 2 (two) times daily with a meal. As needed for pain. Do NOT take in addition to meloxicam, Disp: 60 capsule, Rfl: 5 .  IRON PO, Take by mouth., Disp: , Rfl:  .  lansoprazole (PREVACID) 15 MG capsule, Take 15 mg by mouth daily at 12 noon., Disp: , Rfl:  .  metroNIDAZOLE (METROGEL) 1 % gel, , Disp: , Rfl:  .  Multiple Vitamins-Minerals (CENTRUM SILVER) CHEW, Chew by mouth., Disp: , Rfl:  .  Omega-3-6-9 CAPS, Take  by mouth 2 (two) times daily., Disp: , Rfl:  .  sildenafil (VIAGRA) 100 MG tablet, Take 0.5-1 tablets (50-100 mg total) by mouth daily as needed for erectile dysfunction., Disp: 5 tablet, Rfl: 11 .  silver sulfADIAZINE (SILVADENE) 1 % cream, Apply to affected area daily, Disp: 50 g, Rfl: 2 .  terbinafine (LAMISIL) 250 MG tablet, Take 2 (two) tablets daily for 7 days out of each month, Disp: 14 tablet, Rfl: 5 .  traMADol (ULTRAM) 50 MG tablet, TAKE 1 TABLET BY MOUTH 3 TIMES A DAY AS NEEDED, Disp: 90  tablet, Rfl: 5 .  valsartan (DIOVAN) 80 MG tablet, Take 1 tablet (80 mg total) by mouth daily., Disp: 30 tablet, Rfl: 2  Review of Systems  Constitutional: Negative for appetite change, chills and fever.  Respiratory: Negative for chest tightness, shortness of breath and wheezing.   Cardiovascular: Negative for chest pain and palpitations.  Gastrointestinal: Negative for abdominal pain, nausea and vomiting.    Social History  Substance Use Topics  . Smoking status: Former Research scientist (life sciences)  . Smokeless tobacco: Never Used     Comment: quit 2000, smoked about 1.5 packs for about 5 years  . Alcohol use No   Objective:   BP 132/74 (BP Location: Left Arm, Patient Position: Sitting, Cuff Size: Large)   Pulse 98   Temp 97.9 F (36.6 C) (Oral)   Resp 16   Wt 224 lb (101.6 kg)   SpO2 95% Comment: room air  BMI 28.76 kg/m  There were no vitals filed for this visit.   Physical Exam   General Appearance:    Alert, cooperative, no distress  Eyes:    PERRL, conjunctiva/corneas clear, EOM's intact       Lungs:     Clear to auscultation bilaterally, respirations unlabored  Heart:    Regular rate and rhythm  Neurologic:   Awake, alert, oriented x 3. No apparent focal neurological           defect.           Assessment & Plan:     1. Essential hypertension Improved on valsartan. Continue current medications.   - Renal function panel  2. Chronic, continuous use of opioids  - Pain Mgt Scrn (14 Drugs), Ur  3. High risk medication use  - Pain Mgt Scrn (14 Drugs), Ur  4. Neuropathy Stable, Continue current medications.         Lelon Huh, MD  [facility]

## 2016-08-21 LAB — PAIN MGT SCRN (14 DRUGS), UR
AMPHETAMINE SCREEN URINE: NEGATIVE ng/mL
BARBITURATE SCREEN URINE: NEGATIVE ng/mL
BENZODIAZEPINE SCREEN, URINE: POSITIVE ng/mL
Buprenorphine, Urine: NEGATIVE ng/mL
CANNABINOIDS UR QL SCN: NEGATIVE ng/mL
COCAINE(METAB.)SCREEN, URINE: NEGATIVE ng/mL
Creatinine(Crt), U: 131.6 mg/dL (ref 20.0–300.0)
Fentanyl, Urine: NEGATIVE pg/mL
Meperidine Screen, Urine: NEGATIVE ng/mL
Methadone Screen, Urine: NEGATIVE ng/mL
OXYCODONE+OXYMORPHONE UR QL SCN: NEGATIVE ng/mL
Opiate Scrn, Ur: POSITIVE ng/mL
PROPOXYPHENE SCREEN URINE: NEGATIVE ng/mL
Ph of Urine: 5.2 (ref 4.5–8.9)
Phencyclidine Qn, Ur: NEGATIVE ng/mL
Tramadol Screen, Urine: POSITIVE ng/mL

## 2016-08-22 ENCOUNTER — Other Ambulatory Visit: Payer: Self-pay | Admitting: Family Medicine

## 2016-08-22 ENCOUNTER — Ambulatory Visit: Payer: Commercial Managed Care - PPO | Admitting: Family Medicine

## 2016-08-22 DIAGNOSIS — I10 Essential (primary) hypertension: Secondary | ICD-10-CM | POA: Diagnosis not present

## 2016-08-23 LAB — RENAL FUNCTION PANEL
ALBUMIN: 4.5 g/dL (ref 3.5–5.5)
BUN/Creatinine Ratio: 30 — ABNORMAL HIGH (ref 9–20)
BUN: 31 mg/dL — ABNORMAL HIGH (ref 6–24)
CALCIUM: 9.5 mg/dL (ref 8.7–10.2)
CHLORIDE: 100 mmol/L (ref 96–106)
CO2: 25 mmol/L (ref 18–29)
Creatinine, Ser: 1.05 mg/dL (ref 0.76–1.27)
GFR calc Af Amer: 90 mL/min/{1.73_m2} (ref >59)
GFR calc non Af Amer: 78 mL/min/{1.73_m2} (ref >59)
GLUCOSE: 108 mg/dL — AB (ref 65–99)
PHOSPHORUS: 3.2 mg/dL (ref 2.5–4.5)
POTASSIUM: 4.9 mmol/L (ref 3.5–5.2)
Sodium: 140 mmol/L (ref 134–144)

## 2016-08-27 ENCOUNTER — Ambulatory Visit (INDEPENDENT_AMBULATORY_CARE_PROVIDER_SITE_OTHER): Payer: Commercial Managed Care - PPO | Admitting: Physician Assistant

## 2016-08-27 ENCOUNTER — Ambulatory Visit (INDEPENDENT_AMBULATORY_CARE_PROVIDER_SITE_OTHER): Payer: Commercial Managed Care - PPO

## 2016-08-27 ENCOUNTER — Encounter (INDEPENDENT_AMBULATORY_CARE_PROVIDER_SITE_OTHER): Payer: Self-pay | Admitting: Vascular Surgery

## 2016-08-27 ENCOUNTER — Ambulatory Visit (INDEPENDENT_AMBULATORY_CARE_PROVIDER_SITE_OTHER): Payer: Commercial Managed Care - PPO | Admitting: Vascular Surgery

## 2016-08-27 ENCOUNTER — Encounter (INDEPENDENT_AMBULATORY_CARE_PROVIDER_SITE_OTHER): Payer: Self-pay

## 2016-08-27 ENCOUNTER — Encounter: Payer: Self-pay | Admitting: Physician Assistant

## 2016-08-27 VITALS — BP 132/80 | HR 61 | Resp 16 | Wt 219.8 lb

## 2016-08-27 VITALS — BP 126/74 | HR 60 | Temp 98.6°F | Resp 16 | Wt 220.0 lb

## 2016-08-27 DIAGNOSIS — I872 Venous insufficiency (chronic) (peripheral): Secondary | ICD-10-CM

## 2016-08-27 DIAGNOSIS — I8393 Asymptomatic varicose veins of bilateral lower extremities: Secondary | ICD-10-CM

## 2016-08-27 DIAGNOSIS — F1011 Alcohol abuse, in remission: Secondary | ICD-10-CM

## 2016-08-27 DIAGNOSIS — Z23 Encounter for immunization: Secondary | ICD-10-CM | POA: Diagnosis not present

## 2016-08-27 DIAGNOSIS — Z125 Encounter for screening for malignant neoplasm of prostate: Secondary | ICD-10-CM

## 2016-08-27 DIAGNOSIS — I8391 Asymptomatic varicose veins of right lower extremity: Secondary | ICD-10-CM | POA: Diagnosis not present

## 2016-08-27 DIAGNOSIS — Z0000 Encounter for general adult medical examination without abnormal findings: Secondary | ICD-10-CM | POA: Diagnosis not present

## 2016-08-27 DIAGNOSIS — E119 Type 2 diabetes mellitus without complications: Secondary | ICD-10-CM

## 2016-08-27 DIAGNOSIS — I890 Lymphedema, not elsewhere classified: Secondary | ICD-10-CM | POA: Diagnosis not present

## 2016-08-27 DIAGNOSIS — I10 Essential (primary) hypertension: Secondary | ICD-10-CM

## 2016-08-27 DIAGNOSIS — Z87898 Personal history of other specified conditions: Secondary | ICD-10-CM

## 2016-08-27 DIAGNOSIS — I739 Peripheral vascular disease, unspecified: Secondary | ICD-10-CM

## 2016-08-27 DIAGNOSIS — Z1211 Encounter for screening for malignant neoplasm of colon: Secondary | ICD-10-CM

## 2016-08-27 NOTE — Patient Instructions (Signed)

## 2016-08-27 NOTE — Progress Notes (Signed)
Subjective:    Patient ID: Dustin Guerra, male    DOB: December 21, 1955, 61 y.o.   MRN: [id number] Chief Complaint  Patient presents with  . Follow-up    Patient presents to review vascular studies. Patient last seen on 07/30/2016 for evaluation of painful varicose veins of lower extremity ulceration. The patient has been undergoing weekly left lower extremity wraps for edema with left medial ankle ulceration. The patient reports improvement in his edema and the size of his ulcerations starting Unna boot therapy. Patient underwent a lower extremity venous reflux exam which was notable for deep venous system reflux bilateral lower extremities, incompetent torturous right distal thigh perforator vein with reflux, incompetence of the left small saphenous vein and incompetence of the left great saphenous vein remnant. Patient denies any fever, nausea or vomiting.   Review of Systems  Constitutional: Negative.   HENT: Negative.   Eyes: Negative.   Respiratory: Negative.   Cardiovascular: Positive for leg swelling.  Gastrointestinal: Negative.   Endocrine: Negative.   Genitourinary: Negative.   Musculoskeletal: Negative.   Skin: Positive for wound.  Allergic/Immunologic: Negative.   Neurological: Negative.   Hematological: Negative.   Psychiatric/Behavioral: Negative.       Objective:   Physical Exam  Constitutional: He is oriented to person, place, and time. He appears well-developed and well-nourished. No distress.  HENT:  Head: Normocephalic and atraumatic.  Eyes: Conjunctivae are normal. Pupils are equal, round, and reactive to light.  Neck: Normal range of motion.  Cardiovascular: Normal rate, regular rhythm, normal heart sounds and intact distal pulses.   Pulses:      Radial pulses are 2+ on the right side, and 2+ on the left side.       Dorsalis pedis pulses are 2+ on the right side, and 2+ on the left side.       Posterior tibial pulses are 2+ on the right side, and 2+  on the left side.  Pulmonary/Chest: Effort normal.  Musculoskeletal: Normal range of motion. He exhibits edema (Improved left lower extremity edema.).  Neurological: He is alert and oriented to person, place, and time.  Skin: He is not diaphoretic.     There is improvement noted to the left medial ankle ulceration. It has decreased in size and there is granulation tissue present. It is shallow.  Psychiatric: He has a normal mood and affect. His behavior is normal. Judgment and thought content normal.  Vitals reviewed.   BP 132/80   Pulse 61   Resp 16   Wt 219 lb 12.8 oz (99.7 kg)   BMI 28.22 kg/m   Past Medical History:  Diagnosis Date  . Anxiety   . Hypertension     Social History   Social History  . Marital status: Single    Spouse name: N/A  . Number of children: N/A  . Years of education: N/A   Occupational History  . Not on file.   Social History Main Topics  . Smoking status: Former Research scientist (life sciences)  . Smokeless tobacco: Never Used     Comment: quit 2000, smoked about 1.5 packs for about 5 years  . Alcohol use No  . Drug use: No  . Sexual activity: Not on file   Other Topics Concern  . Not on file   Social History Narrative  . No narrative on file    Past Surgical History:  Procedure Laterality Date  . BACK SURGERY    . COLON SURGERY  2015   Colon resection  due to diverticulosis  . VEIN LIGATION AND STRIPPING    . VEIN SURGERY     Vein stripping at age [age]    Family History  Problem Relation Age of Onset  . Alcohol abuse Mother   . Esophageal cancer Father   . Heart failure Brother     Allergies  Allergen Reactions  . Codeine Itching       Assessment & Plan:  Patient presents to review vascular studies. Patient last seen on 07/30/2016 for evaluation of painful varicose veins of lower extremity ulceration. The patient has been undergoing weekly left lower extremity wraps for edema with left medial ankle ulceration. The patient reports  improvement in his edema and the size of his ulcerations starting Unna boot therapy. Patient underwent a lower extremity venous reflux exam which was notable for deep venous system reflux bilateral lower extremities, incompetent torturous right distal thigh perforator vein with reflux, incompetence of the left small saphenous vein and incompetence of the left great saphenous vein remnant. Patient denies any fever, nausea or vomiting.  1. Varicose veins of both lower extremities - stable As above  2. Venous insufficiency of both lower extremities - new Patient is currently undergoing weekly Unna boot therapy to the left lower extremity for edema with ulcer development. There is improvement in the patient's edema and size of the ulceration since starting Unna boot therapy. The patient would benefit from laser ablation to the left small saphenous vein. Following the ablation the patient would benefit from foam sclerotherapy to the large varicosities in his bilateral extremities that have reflux. Patient to continue Unna boot therapy until ulceration is healed and minimal transition into medical grade 1 compression stockings. The patient continues to elevate his legs heart level or higher as much as possible. The patient continues to remain active he goes to the gym regularly.  3. Lymphedema - New Once the patient undergoes laser ablation and foam sclerotherapy he would benefit from bilateral lymphedema pumps. This would control the reflux noted in the bilateral deep venous system. We'll apply for a lymphedema pump once the patient undergoes treatment for his superficial reflux issues.  The patient is in agreement and would like to proceed with the above.   Current Outpatient Prescriptions on File Prior to Visit  Medication Sig Dispense Refill  . aspirin 81 MG tablet Take by mouth.    . Azelaic Acid (FINACEA) 15 % cream After skin is thoroughly washed and patted dry, gently but thoroughly massage a  thin film of azelaic acid cream into the affected area twice daily, in the morning and evening. 50 g 5  . cilostazol (PLETAL) 100 MG tablet TAKE 1 TABLET (100 MG TOTAL) BY MOUTH 2 (TWO) TIMES DAILY AS NEEDED (FOR CIRCULATION). 60 tablet 5  . diazepam (VALIUM) 10 MG tablet TAKE ONE TABLET BY MOUTH DAILY AS NEEDED FOR ANXIETY 30 tablet 5  . docusate sodium (COLACE) 100 MG capsule Take 100 mg by mouth 2 (two) times daily.    Marland Kitchen EPINEPHrine (EPIPEN 2-PAK) 0.3 mg/0.3 mL IJ SOAJ injection     . gabapentin (NEURONTIN) 300 MG capsule TAKE 2 CAPSULES (600 MG TOTAL) BY MOUTH 3 (THREE) TIMES DAILY. 180 capsule 5  . HYDROcodone-acetaminophen (NORCO) 7.5-325 MG tablet 1/2-1 tablet up to four times a day as needed 120 tablet 0  . indomethacin (INDOCIN) 50 MG capsule Take 1 capsule (50 mg total) by mouth 2 (two) times daily with a meal. As needed for pain. Do NOT take in  addition to meloxicam 60 capsule 5  . IRON PO Take by mouth.    . lansoprazole (PREVACID) 15 MG capsule Take 15 mg by mouth daily at 12 noon.    . metroNIDAZOLE (METROGEL) 1 % gel     . Multiple Vitamins-Minerals (CENTRUM SILVER) CHEW Chew by mouth.    . Omega-3-6-9 CAPS Take by mouth 2 (two) times daily.    . sildenafil (VIAGRA) 100 MG tablet Take 0.5-1 tablets (50-100 mg total) by mouth daily as needed for erectile dysfunction. 5 tablet 11  . silver sulfADIAZINE (SILVADENE) 1 % cream Apply to affected area daily 50 g 2  . terbinafine (LAMISIL) 250 MG tablet TAKE TWO TABLETS DAILY FOR 7 DAYS EACH MONTH 14 tablet 0  . traMADol (ULTRAM) 50 MG tablet TAKE 1 TABLET BY MOUTH 3 TIMES A DAY AS NEEDED 90 tablet 5  . valsartan (DIOVAN) 80 MG tablet Take 1 tablet (80 mg total) by mouth daily. 30 tablet 2   No current facility-administered medications on file prior to visit.     There are no Patient Instructions on file for this visit. No Follow-up on file.   Kauan Kloosterman A Dietrick Barris, PA-C

## 2016-08-27 NOTE — Progress Notes (Signed)
Patient: Dustin Guerra, Male    DOB: [birth year], 61 y.o.   MRN: [id number] Visit Date: 08/27/2016  Today's Provider: Trinna Post, PA-C   Chief Complaint  Patient presents with  . Annual Exam   Subjective:    Annual physical exam   Dustin Guerra is a 61 y.o. male who presents today for health maintenance and complete physical. He feels well. He reports exercising daily. He reports he is sleeping fairly well.  He has a history of DM II, PVD, peripheral neuropathy, HTN, and alcohol abuse.  He works as a Government social research officer. He is divorced. He has three kids and eight grandchildren, with one grandchild on the way.   He has not had his yearly dilated eye exam yet. Does not check blood sugars. Eats lean protein with vegetables as diet. Lifts weights 3-4 times per week.   He is currently taking ASA 81 mg as cardiac prophylaxis. He is on Pletal for circulation. He has a venous stasis ulcer on his left leg that he sees vascular surgery for and is currently healing. Doing UNA boot right now.  Takes colace daily for constipation. He had a colon rupture 2/2 diverticulitis and subsequent colon resection in 2015 at [facility]. He says Dr. Janann Guerra did his colonoscopy.   He takes Valium around three times a week before bed to help him sleep.   He is on Norco 7.5 mg, Indocin, tramadol for pain issues.   He takes Valsartan for HTN.   He is also using terbinafine for fungal nail infection.   He does not smoke or drink. Not sexually active.   Would like to have PSA screening today but not DRE. -----------------------------------------------------------------   Review of Systems  Constitutional: Negative.   HENT: Negative.   Eyes: Negative.   Respiratory: Negative.   Cardiovascular: Negative.   Gastrointestinal: Negative.   Endocrine: Negative.   Genitourinary: Negative.   Musculoskeletal: Negative.   Skin: Negative.   Allergic/Immunologic:  Negative.   Neurological: Negative.   Hematological: Negative.   Psychiatric/Behavioral: Negative.     Social History      He  reports that he has quit smoking. He has never used smokeless tobacco. He reports that he does not drink alcohol or use drugs.       Social History   Social History  . Marital status: Single    Spouse name: N/A  . Number of children: N/A  . Years of education: N/A   Social History Main Topics  . Smoking status: Former Research scientist (life sciences)  . Smokeless tobacco: Never Used     Comment: quit 2000, smoked about 1.5 packs for about 5 years  . Alcohol use No  . Drug use: No  . Sexual activity: Not Asked   Other Topics Concern  . None   Social History Narrative  . None    Past Medical History:  Diagnosis Date  . Anxiety   . Hypertension      Patient Active Problem List   Diagnosis Date Noted  . Venous insufficiency of both lower extremities 08/27/2016  . Lymphedema 08/27/2016  . Bilateral lower extremity edema 08/13/2016  . Varicose veins of both lower extremities 07/23/2016  . Arthralgia of both hands 06/09/2016  . PVD (peripheral vascular disease) ([place]) 12/07/2015  . Neuropathy 08/04/2015  . Plantar fasciitis 08/04/2015  . H/O alcohol abuse 08/01/2015  . Allergic to bees 08/01/2015  . Anxiety 08/01/2015  . Diabetes mellitus, type 2 ([place])  08/01/2015  . Clinical depression 08/01/2015  . Abnormal liver enzymes 08/01/2015  . Acid reflux 08/01/2015  . BP (high blood pressure) 08/01/2015  . Episodic paroxysmal anxiety disorder 08/01/2015  . Acne erythematosa 08/01/2015    Past Surgical History:  Procedure Laterality Date  . BACK SURGERY    . COLON SURGERY  2015   Colon resection due to diverticulosis  . VEIN LIGATION AND STRIPPING    . VEIN SURGERY     Vein stripping at age 79-25    Family History        Family Status  Relation Status  . Mother Deceased at age 24  . Father Deceased at age 74  . Brother Deceased at age 35        His family  history includes Alcohol abuse in his mother; Esophageal cancer in his father; Heart failure in his brother.     Allergies  Allergen Reactions  . Codeine Itching     Current Outpatient Prescriptions:  .  aspirin 81 MG tablet, Take by mouth., Disp: , Rfl:  .  Azelaic Acid (FINACEA) 15 % cream, After skin is thoroughly washed and patted dry, gently but thoroughly massage a thin film of azelaic acid cream into the affected area twice daily, in the morning and evening., Disp: 50 g, Rfl: 5 .  cilostazol (PLETAL) 100 MG tablet, TAKE 1 TABLET (100 MG TOTAL) BY MOUTH 2 (TWO) TIMES DAILY AS NEEDED (FOR CIRCULATION)., Disp: 60 tablet, Rfl: 5 .  diazepam (VALIUM) 10 MG tablet, TAKE ONE TABLET BY MOUTH DAILY AS NEEDED FOR ANXIETY, Disp: 30 tablet, Rfl: 5 .  docusate sodium (COLACE) 100 MG capsule, Take 100 mg by mouth 2 (two) times daily., Disp: , Rfl:  .  EPINEPHrine (EPIPEN 2-PAK) 0.3 mg/0.3 mL IJ SOAJ injection, , Disp: , Rfl:  .  gabapentin (NEURONTIN) 300 MG capsule, TAKE 2 CAPSULES (600 MG TOTAL) BY MOUTH 3 (THREE) TIMES DAILY., Disp: 180 capsule, Rfl: 5 .  HYDROcodone-acetaminophen (NORCO) 7.5-325 MG tablet, 1/2-1 tablet up to four times a day as needed, Disp: 120 tablet, Rfl: 0 .  indomethacin (INDOCIN) 50 MG capsule, Take 1 capsule (50 mg total) by mouth 2 (two) times daily with a meal. As needed for pain. Do NOT take in addition to meloxicam, Disp: 60 capsule, Rfl: 5 .  IRON PO, Take by mouth., Disp: , Rfl:  .  lansoprazole (PREVACID) 15 MG capsule, Take 15 mg by mouth daily at 12 noon., Disp: , Rfl:  .  metroNIDAZOLE (METROGEL) 1 % gel, , Disp: , Rfl:  .  Multiple Vitamins-Minerals (CENTRUM SILVER) CHEW, Chew by mouth., Disp: , Rfl:  .  Omega-3-6-9 CAPS, Take by mouth 2 (two) times daily., Disp: , Rfl:  .  sildenafil (VIAGRA) 100 MG tablet, Take 0.5-1 tablets (50-100 mg total) by mouth daily as needed for erectile dysfunction., Disp: 5 tablet, Rfl: 11 .  silver sulfADIAZINE (SILVADENE) 1 %  cream, Apply to affected area daily, Disp: 50 g, Rfl: 2 .  terbinafine (LAMISIL) 250 MG tablet, TAKE TWO TABLETS DAILY FOR 7 DAYS EACH MONTH, Disp: 14 tablet, Rfl: 0 .  traMADol (ULTRAM) 50 MG tablet, TAKE 1 TABLET BY MOUTH 3 TIMES A DAY AS NEEDED, Disp: 90 tablet, Rfl: 5 .  valsartan (DIOVAN) 80 MG tablet, Take 1 tablet (80 mg total) by mouth daily., Disp: 30 tablet, Rfl: 2   Patient Care Team: Birdie Sons, MD as PCP - General (Family Medicine)  Objective:   Vitals: BP 126/74 (BP Location: Left Arm, Patient Position: Sitting, Cuff Size: Normal)   Pulse 60   Temp 98.6 F (37 C) (Oral)   Resp 16   Wt 220 lb (99.8 kg)   BMI 28.25 kg/m    Vitals:   08/27/16 1515  BP: 126/74  Pulse: 60  Resp: 16  Temp: 98.6 F (37 C)  TempSrc: Oral  Weight: 220 lb (99.8 kg)     Physical Exam  Constitutional: He is oriented to person, place, and time. He appears well-developed and well-nourished.  Cardiovascular: Normal rate and regular rhythm.   Pulmonary/Chest: Effort normal and breath sounds normal.  Abdominal: Soft. Bowel sounds are normal.  Neurological: He is alert and oriented to person, place, and time.  Skin: Skin is warm and dry.  Psychiatric: He has a normal mood and affect. His behavior is normal.     Depression Screen PHQ 2/9 Scores 06/06/2016  PHQ - 2 Score 0  PHQ- 9 Score 1      Assessment & Plan:     Routine Health Maintenance and Physical Exam  Exercise Activities and Dietary recommendations Goals    None       There is no immunization history on file for this patient.  Health Maintenance  Topic Date Due  . Hepatitis C Screening  Feb 13, 1956  . PNEUMOCOCCAL POLYSACCHARIDE VACCINE (1) 09/09/1959  . FOOT EXAM  09/09/1967  . OPHTHALMOLOGY EXAM  09/09/1967  . HIV Screening  09/08/1972  . TETANUS/TDAP  09/08/1976  . COLONOSCOPY  09/09/2007  . INFLUENZA VACCINE  11/06/2016 (Originally 10/15/2016)  . HEMOGLOBIN A1C  12/07/2016     Discussed  health benefits of physical activity, and encouraged him to engage in regular exercise appropriate for his age and condition.    1. Annual physical exam  Requesting we fax a record of him having physical exam to insurer for enrollment purposes.  - PSA  2. Essential hypertension  Continue current medications, stable.  3. Type 2 diabetes mellitus without complication, without long-term current use of insulin (HCC)  Last A1C was 6.0. Deferred foot exam today because of UNA boot. Advised eye exam with opthalmology.  4. Prostate cancer screening   5. Need for Tdap vaccination  - Tdap vaccine greater than or equal to 7yo IM  6. PVD (peripheral vascular disease) (HCC)  Healing venous stasis ulcer, sees vascular surgery.  7. H/O alcohol abuse  Currently not using alcohol, cites faith as major motivation for abstaining.  8. Colon cancer screening  Patient reports he had colonoscopy three years ago after colon rupture at [facility]. Will have him sign ROI and try to get these records.  Return in about 3 months (around 11/27/2016) for Dr. Caryn Section follow up .  The entirety of the information documented in the History of Present Illness, Review of Systems and Physical Exam were personally obtained by me. Portions of this information were initially documented by Ashley Royalty, CMA and reviewed by me for thoroughness and accuracy.    --------------------------------------------------------------------    Trinna Post, PA-C  [facility]

## 2016-09-03 ENCOUNTER — Encounter (INDEPENDENT_AMBULATORY_CARE_PROVIDER_SITE_OTHER): Payer: Self-pay | Admitting: Vascular Surgery

## 2016-09-03 ENCOUNTER — Ambulatory Visit (INDEPENDENT_AMBULATORY_CARE_PROVIDER_SITE_OTHER): Payer: Commercial Managed Care - PPO | Admitting: Vascular Surgery

## 2016-09-03 VITALS — BP 147/77 | HR 67 | Resp 17 | Wt 220.0 lb

## 2016-09-03 DIAGNOSIS — I872 Venous insufficiency (chronic) (peripheral): Secondary | ICD-10-CM

## 2016-09-03 NOTE — Progress Notes (Signed)
History of Present Illness  There is no documented history at this time  Assessments & Plan   There are no diagnoses linked to this encounter.    Additional instructions  Subjective:  Patient presents with venous ulcer of the Left lower extremity.    Procedure:  3 layer unna wrap was placed Left lower extremity.   Plan:   Follow up in one week.  

## 2016-09-10 ENCOUNTER — Encounter (INDEPENDENT_AMBULATORY_CARE_PROVIDER_SITE_OTHER): Payer: Self-pay | Admitting: Vascular Surgery

## 2016-09-10 ENCOUNTER — Ambulatory Visit (INDEPENDENT_AMBULATORY_CARE_PROVIDER_SITE_OTHER): Payer: Commercial Managed Care - PPO | Admitting: Vascular Surgery

## 2016-09-10 VITALS — BP 143/87 | HR 83 | Resp 15 | Ht 72.0 in | Wt 219.0 lb

## 2016-09-10 DIAGNOSIS — I890 Lymphedema, not elsewhere classified: Secondary | ICD-10-CM | POA: Diagnosis not present

## 2016-09-10 NOTE — Progress Notes (Signed)
History of Present Illness  There is no documented history at this time  Assessments & Plan   There are no diagnoses linked to this encounter.    Additional instructions  Subjective:  Patient presents with venous ulcer of the Left lower extremity.    Procedure:  3 layer unna wrap was placed Left lower extremity.   Plan:   Follow up in one week.  

## 2016-09-16 ENCOUNTER — Ambulatory Visit (INDEPENDENT_AMBULATORY_CARE_PROVIDER_SITE_OTHER): Payer: Commercial Managed Care - PPO | Admitting: Vascular Surgery

## 2016-09-16 ENCOUNTER — Encounter (INDEPENDENT_AMBULATORY_CARE_PROVIDER_SITE_OTHER): Payer: Self-pay | Admitting: Vascular Surgery

## 2016-09-16 VITALS — BP 119/72 | HR 71 | Resp 16 | Wt 219.0 lb

## 2016-09-16 DIAGNOSIS — I890 Lymphedema, not elsewhere classified: Secondary | ICD-10-CM

## 2016-09-16 NOTE — Progress Notes (Signed)
History of Present Illness  There is no documented history at this time  Assessments & Plan   There are no diagnoses linked to this encounter.    Additional instructions  Subjective:  Patient presents with venous ulcer of the Left lower extremity.    Procedure:  3 layer unna wrap was placed Left lower extremity.   Plan:   Follow up in one week.  

## 2016-09-22 ENCOUNTER — Ambulatory Visit (INDEPENDENT_AMBULATORY_CARE_PROVIDER_SITE_OTHER): Payer: Commercial Managed Care - PPO | Admitting: Vascular Surgery

## 2016-09-22 ENCOUNTER — Other Ambulatory Visit: Payer: Self-pay | Admitting: Family Medicine

## 2016-09-22 ENCOUNTER — Encounter (INDEPENDENT_AMBULATORY_CARE_PROVIDER_SITE_OTHER): Payer: Self-pay | Admitting: Vascular Surgery

## 2016-09-22 VITALS — BP 127/71 | HR 83 | Resp 16 | Wt 222.0 lb

## 2016-09-22 DIAGNOSIS — I872 Venous insufficiency (chronic) (peripheral): Secondary | ICD-10-CM | POA: Diagnosis not present

## 2016-09-22 DIAGNOSIS — E119 Type 2 diabetes mellitus without complications: Secondary | ICD-10-CM | POA: Diagnosis not present

## 2016-09-22 DIAGNOSIS — I83023 Varicose veins of left lower extremity with ulcer of ankle: Secondary | ICD-10-CM

## 2016-09-22 DIAGNOSIS — G629 Polyneuropathy, unspecified: Secondary | ICD-10-CM

## 2016-09-22 DIAGNOSIS — I890 Lymphedema, not elsewhere classified: Secondary | ICD-10-CM | POA: Diagnosis not present

## 2016-09-22 DIAGNOSIS — I8393 Asymptomatic varicose veins of bilateral lower extremities: Secondary | ICD-10-CM | POA: Diagnosis not present

## 2016-09-22 DIAGNOSIS — L97329 Non-pressure chronic ulcer of left ankle with unspecified severity: Secondary | ICD-10-CM | POA: Diagnosis not present

## 2016-09-22 MED ORDER — HYDROCODONE-ACETAMINOPHEN 7.5-325 MG PO TABS: ORAL_TABLET | ORAL | 0 refills | Status: DC

## 2016-09-22 NOTE — Telephone Encounter (Signed)
Pt needs refill on his   HYDROcodone-acetaminophen (NORCO) 7.5-325 MG tablet  Pt would like to pick up today since he lives in [facility] and has another appt here in [place] today  Thanks [name]

## 2016-09-22 NOTE — Telephone Encounter (Signed)
Please review. Thanks!  

## 2016-09-24 DIAGNOSIS — L97329 Non-pressure chronic ulcer of left ankle with unspecified severity: Secondary | ICD-10-CM | POA: Insufficient documentation

## 2016-09-24 DIAGNOSIS — I83023 Varicose veins of left lower extremity with ulcer of ankle: Secondary | ICD-10-CM | POA: Insufficient documentation

## 2016-09-24 NOTE — Progress Notes (Signed)
MRN : [id number]  Dustin Guerra is a 61 y.o. (08/14/1955) male who presents with chief complaint of  Chief Complaint  Patient presents with  . Louretta Parma follow up  .  History of Present Illness: Patient is seen for follow up evaluation of leg pain and swelling associated with venous ulceration. The patient was recently seen here and started on Unna boot therapy.  The swelling abruptly became much worse bilaterally and is associated with pain and discoloration. The pain and swelling worsens with prolonged dependency and improves with elevation.  The patient notes that in the morning the legs are better but the leg symptoms worsened throughout the course of the day. The patient has also noted a progressive worsening of the discoloration in the ankle and shin area.   The patient notes that an ulcer has developed acutely without specific trauma and since it occurred it has been very slow to heal.  There is a moderate amount of drainage associated with the open area.  The wound is also very painful.  The patient notes that they were not able to tolerate the Unna boot and removed it several days ago.  The patient states that they have been elevating as much as possible. The patient denies any recent changes in medications.  The patient denies a history of DVT or PE. There is no prior history of phlebitis. There is no history of primary lymphedema.  No SOB or increased cough.  No sputum production.  No recent episodes of CHF exacerbation.   Current Meds  Medication Sig  . aspirin 81 MG tablet Take by mouth.  . Azelaic Acid (FINACEA) 15 % cream After skin is thoroughly washed and patted dry, gently but thoroughly massage a thin film of azelaic acid cream into the affected area twice daily, in the morning and evening.  . cilostazol (PLETAL) 100 MG tablet TAKE 1 TABLET (100 MG TOTAL) BY MOUTH 2 (TWO) TIMES DAILY AS NEEDED (FOR CIRCULATION).  Marland Kitchen diazepam (VALIUM) 10 MG tablet TAKE ONE TABLET  BY MOUTH DAILY AS NEEDED FOR ANXIETY  . docusate sodium (COLACE) 100 MG capsule Take 100 mg by mouth 2 (two) times daily.  Marland Kitchen EPINEPHrine (EPIPEN 2-PAK) 0.3 mg/0.3 mL IJ SOAJ injection   . gabapentin (NEURONTIN) 300 MG capsule TAKE 2 CAPSULES (600 MG TOTAL) BY MOUTH 3 (THREE) TIMES DAILY.  . indomethacin (INDOCIN) 50 MG capsule Take 1 capsule (50 mg total) by mouth 2 (two) times daily with a meal. As needed for pain. Do NOT take in addition to meloxicam  . IRON PO Take by mouth.  . lansoprazole (PREVACID) 15 MG capsule Take 15 mg by mouth daily at 12 noon.  . metroNIDAZOLE (METROGEL) 1 % gel   . Multiple Vitamins-Minerals (CENTRUM SILVER) CHEW Chew by mouth.  . Omega-3-6-9 CAPS Take by mouth 2 (two) times daily.  . sildenafil (VIAGRA) 100 MG tablet Take 0.5-1 tablets (50-100 mg total) by mouth daily as needed for erectile dysfunction.  . silver sulfADIAZINE (SILVADENE) 1 % cream Apply to affected area daily  . traMADol (ULTRAM) 50 MG tablet TAKE 1 TABLET BY MOUTH 3 TIMES A DAY AS NEEDED  . valsartan (DIOVAN) 80 MG tablet Take 1 tablet (80 mg total) by mouth daily.  . [DISCONTINUED] HYDROcodone-acetaminophen (NORCO) 7.5-325 MG tablet 1/2-1 tablet up to four times a day as needed  . [DISCONTINUED] terbinafine (LAMISIL) 250 MG tablet TAKE TWO TABLETS DAILY FOR 7 DAYS EACH MONTH    Past Medical History:  Diagnosis Date  . Anxiety   . Hypertension     Past Surgical History:  Procedure Laterality Date  . BACK SURGERY    . COLON SURGERY  2015   Colon resection due to diverticulosis  . VEIN LIGATION AND STRIPPING    . VEIN SURGERY     Vein stripping at age 24-25    Social History Social History  Substance Use Topics  . Smoking status: Former Research scientist (life sciences)  . Smokeless tobacco: Never Used     Comment: quit 2000, smoked about 1.5 packs for about 5 years  . Alcohol use No    Family History Family History  Problem Relation Age of Onset  . Alcohol abuse Mother   . Esophageal cancer Father     . Heart failure Brother     Allergies  Allergen Reactions  . Codeine Itching     REVIEW OF SYSTEMS (Negative unless checked)  Constitutional: [] Weight loss  [] Fever  [] Chills Cardiac: [] Chest pain   [] Chest pressure   [] Palpitations   [] Shortness of breath when laying flat   [] Shortness of breath with exertion. Vascular:  [] Pain in legs with walking   [] Pain in legs at rest  [] History of DVT   [] Phlebitis   [x] Swelling in legs   [x] Varicose veins   [x] Non-healing ulcers Pulmonary:   [] Uses home oxygen   [] Productive cough   [] Hemoptysis   [] Wheeze  [] COPD   [] Asthma Neurologic:  [] Dizziness   [] Seizures   [] History of stroke   [] History of TIA  [] Aphasia   [] Vissual changes   [] Weakness or numbness in arm   [] Weakness or numbness in leg Musculoskeletal:   [] Joint swelling   [] Joint pain   [] Low back pain Hematologic:  [] Easy bruising  [] Easy bleeding   [] Hypercoagulable state   [] Anemic Gastrointestinal:  [] Diarrhea   [] Vomiting  [] Gastroesophageal reflux/heartburn   [] Difficulty swallowing. Genitourinary:  [] Chronic kidney disease   [] Difficult urination  [] Frequent urination   [] Blood in urine Skin:  [x] Rashes   [x] Ulcers  Psychological:  [] History of anxiety   []  History of major depression.  Physical Examination  Vitals:   09/22/16 1557  BP: 127/71  Pulse: 83  Resp: 16  Weight: 100.7 kg (222 lb)   Body mass index is 30.11 kg/m. Gen: WD/WN, NAD Head: [place]/AT, No temporalis wasting.  Ear/Nose/Throat: Hearing grossly intact, nares w/o erythema or drainage Eyes: PER, EOMI, sclera nonicteric.  Neck: Supple, no large masses.   Pulmonary:  Good air movement, no audible wheezing bilaterally, no use of accessory muscles.  Cardiac: RRR, no JVD Vascular: 2-3+ edema of the left leg with severe venous changes of the left leg.  Venous ulcer noted in the ankle area on the left, noninfected Vessel Right Left  Radial Palpable Palpable  PT Palpable Palpable  DP Palpable Palpable   Gastrointestinal: Non-distended. No guarding/no peritoneal signs.  Musculoskeletal: M/S 5/5 throughout.  No deformity or atrophy.  Neurologic: CN 2-12 intact. Symmetrical.  Speech is fluent. Motor exam as listed above. Psychiatric: Judgment intact, Mood & affect appropriate for pt's clinical situation. Dermatologic: Venous stasis dermatitis with ulcers present on the left.  No changes consistent with cellulitis. Lymph : No lichenification or skin changes of chronic lymphedema.  CBC Lab Results  Component Value Date   WBC 6.1 06/04/2012   HGB 12.3 (L) 06/05/2012   HCT 31.7 (L) 06/04/2012   MCV 107 (H) 06/04/2012   PLT 127 (L) 06/04/2012    BMET    Component Value Date/Time  NA 140 08/22/2016 1632   NA 140 06/04/2012 0610   K 4.9 08/22/2016 1632   K 3.7 06/04/2012 0610   CL 100 08/22/2016 1632   CL 107 06/04/2012 0610   CO2 25 08/22/2016 1632   CO2 26 06/04/2012 0610   GLUCOSE 108 (H) 08/22/2016 1632   GLUCOSE 99 06/04/2012 0610   BUN 31 (H) 08/22/2016 1632   BUN 9 06/04/2012 0610   CREATININE 1.05 08/22/2016 1632   CREATININE 0.77 06/04/2012 0610   CALCIUM 9.5 08/22/2016 1632   CALCIUM 7.8 (L) 06/04/2012 0610   GFRNONAA 78 08/22/2016 1632   GFRNONAA >60 06/04/2012 0610   GFRAA 90 08/22/2016 1632   GFRAA >60 06/04/2012 0610   CrCl cannot be calculated (Patient's most recent lab result is older than the maximum 21 days allowed.).  COAG No results found for: INR, PROTIME  Radiology No results found.    Assessment/Plan 1. Venous ulcer of ankle, left (HCC) No surgery or intervention at this point in time.    I have had a long discussion with the patient regarding venous insufficiency and why it  causes symptoms, specifically venous ulceration . I have discussed with the patient the chronic skin changes that accompany venous insufficiency and the long term sequela such as infection and recurring  ulceration.  Patient will be placed in [employer] which will be  changed weekly drainage permitting.  In addition, behavioral modification including several periods of elevation of the lower extremities during the day will be continued. Achieving a position with the ankles at heart level was stressed to the patient  The patient is instructed to begin routine exercise, especially walking on a daily basis  Following the review of the ultrasound the patient will follow up in one week to reassess the degree of swelling and the control that Unna therapy is offering.   The patient can be assessed for graduated compression stockings or wraps as well as a Lymph Pump once the ulcers are healed.   2. Varicose veins of both lower extremities See #1  3. Venous insufficiency of both lower extremities No surgery or intervention at this point in time.    I have had a long discussion with the patient regarding venous insufficiency and why it  causes symptoms. I have discussed with the patient the chronic skin changes that accompany venous insufficiency and the long term sequela such as infection and ulceration.  Patient will begin wearing graduated compression stockings class 1 (20-30 mmHg) or compression wraps on a daily basis a prescription was given. The patient will put the stockings on first thing in the morning and removing them in the evening. The patient is instructed specifically not to sleep in the stockings.    In addition, behavioral modification including several periods of elevation of the lower extremities during the day will be continued. I have demonstrated that proper elevation is a position with the ankles at heart level.  The patient is instructed to begin routine exercise, especially walking on a daily basis  Following the review of the ultrasound the patient will follow up in 2-3 months to reassess the degree of swelling and the control that graduated compression stockings or compression wraps  is offering.   The patient can be assessed for a Lymph Pump  at that time  4. Lymphedema Continue compression  5. Type 2 diabetes mellitus without complication, without long-term current use of insulin (HCC) Continue hypoglycemic medications as already ordered, these medications have been reviewed and  there are no changes at this time.  Hgb A1C to be monitored as already arranged by primary service     Hortencia Pilar, MD  09/24/2016 12:45 PM

## 2016-10-01 ENCOUNTER — Encounter (INDEPENDENT_AMBULATORY_CARE_PROVIDER_SITE_OTHER): Payer: Self-pay | Admitting: Vascular Surgery

## 2016-10-01 ENCOUNTER — Ambulatory Visit (INDEPENDENT_AMBULATORY_CARE_PROVIDER_SITE_OTHER): Payer: Commercial Managed Care - PPO | Admitting: Vascular Surgery

## 2016-10-01 VITALS — BP 152/76 | HR 64 | Resp 16 | Wt 220.0 lb

## 2016-10-01 DIAGNOSIS — I890 Lymphedema, not elsewhere classified: Secondary | ICD-10-CM | POA: Diagnosis not present

## 2016-10-01 NOTE — Progress Notes (Signed)
History of Present Illness  There is no documented history at this time  Assessments & Plan   There are no diagnoses linked to this encounter.    Additional instructions  Subjective:  Patient presents with venous ulcer of the Left lower extremity.    Procedure:  3 layer unna wrap was placed Left lower extremity.   Plan:   Follow up in one week.  

## 2016-10-06 ENCOUNTER — Other Ambulatory Visit: Payer: Self-pay | Admitting: Family Medicine

## 2016-10-06 MED ORDER — IRBESARTAN 75 MG PO TABS: 75.0000 mg | ORAL_TABLET | Freq: Every day | ORAL | 5 refills | Status: DC

## 2016-10-08 ENCOUNTER — Ambulatory Visit (INDEPENDENT_AMBULATORY_CARE_PROVIDER_SITE_OTHER): Payer: Commercial Managed Care - PPO | Admitting: Vascular Surgery

## 2016-10-08 ENCOUNTER — Encounter (INDEPENDENT_AMBULATORY_CARE_PROVIDER_SITE_OTHER): Payer: Self-pay

## 2016-10-08 VITALS — BP 122/77 | HR 71 | Resp 14 | Ht 74.0 in | Wt 219.0 lb

## 2016-10-08 DIAGNOSIS — I83023 Varicose veins of left lower extremity with ulcer of ankle: Secondary | ICD-10-CM

## 2016-10-08 DIAGNOSIS — L97329 Non-pressure chronic ulcer of left ankle with unspecified severity: Secondary | ICD-10-CM

## 2016-10-08 NOTE — Progress Notes (Signed)
History of Present Illness  There is no documented history at this time  Assessments & Plan   There are no diagnoses linked to this encounter.    Additional instructions  Subjective:  Patient presents with venous ulcer of the Left lower extremity.    Procedure:  3 layer unna wrap was placed Left lower extremity.   Plan:   Follow up in one week.  

## 2016-10-13 ENCOUNTER — Other Ambulatory Visit: Payer: Self-pay | Admitting: Family Medicine

## 2016-10-13 MED ORDER — SILDENAFIL CITRATE 100 MG PO TABS: 50.0000 mg | ORAL_TABLET | Freq: Every day | ORAL | 12 refills | Status: DC | PRN

## 2016-10-13 NOTE — Telephone Encounter (Signed)
[facility] faxed a request for the following medication. Thanks CC  sildenafil (VIAGRA) 100 MG tablet

## 2016-10-16 ENCOUNTER — Encounter (INDEPENDENT_AMBULATORY_CARE_PROVIDER_SITE_OTHER): Payer: Self-pay | Admitting: Vascular Surgery

## 2016-10-16 ENCOUNTER — Ambulatory Visit (INDEPENDENT_AMBULATORY_CARE_PROVIDER_SITE_OTHER): Payer: Commercial Managed Care - PPO | Admitting: Vascular Surgery

## 2016-10-16 VITALS — BP 149/86 | HR 67 | Resp 14 | Ht 74.0 in | Wt 198.0 lb

## 2016-10-16 DIAGNOSIS — I872 Venous insufficiency (chronic) (peripheral): Secondary | ICD-10-CM

## 2016-10-16 DIAGNOSIS — I8393 Asymptomatic varicose veins of bilateral lower extremities: Secondary | ICD-10-CM

## 2016-10-16 NOTE — Progress Notes (Signed)
    MRN : [id number]  Blease Capaldi is a 61 y.o. (1955-09-25) male who presents with chief complaint of  Chief Complaint  Patient presents with  . Venous Insufficiency    Left leg laser ablation GSV and SSV  .    The patient's left lower extremity was sterilely prepped and draped.  The ultrasound machine was used to visualize the great saphenous vein throughout its course.  A segment of the great saphenous vein was selected for access.  The saphenous vein was accessed without difficulty using ultrasound guidance with a micropuncture needle.   An 0.018  wire was placed beyond the saphenofemoral junction through the sheath and the microneedle was removed.  The 65 cm sheath was then placed over the wire and the wire and dilator were removed.  The laser fiber was placed through the sheath and its tip was placed approximately 2 cm below the saphenofemoral junction.  Tumescent anesthesia was then created with a dilute lidocaine solution.  Laser energy was then delivered with constant withdrawal of the sheath and laser fiber.  Approximately 964 Joules of energy were delivered over a length of 20 cm.    The patient's leg was then repositioned and reprepped and redraped in a sterile fashion. The small saphenous vein was then evaluated with ultrasound.   A segment of the small saphenous vein was selected for access.  The saphenous vein was accessed without difficulty using ultrasound guidance with a micropuncture needle.   An 0.018  wire was placed beyond the saphenopopliteal junction through the sheath and the microneedle was removed.  The 65 cm sheath was then placed over the wire and the wire and dilator were removed.  The laser fiber was placed through the sheath and its tip was placed approximately 2 cm below the saphenopopliteal junction.  Tumescent anesthesia was then created with a dilute lidocaine solution.  Laser energy was then delivered with constant withdrawal of the sheath and laser fiber.   Approximately 580 Joules of energy were delivered over a length of 11 cm.    Sterile dressings were placed.  The patient tolerated the procedure well without complications.

## 2016-10-20 ENCOUNTER — Other Ambulatory Visit (INDEPENDENT_AMBULATORY_CARE_PROVIDER_SITE_OTHER): Payer: Commercial Managed Care - PPO

## 2016-10-20 ENCOUNTER — Other Ambulatory Visit (INDEPENDENT_AMBULATORY_CARE_PROVIDER_SITE_OTHER): Payer: Self-pay | Admitting: Vascular Surgery

## 2016-10-20 DIAGNOSIS — I8393 Asymptomatic varicose veins of bilateral lower extremities: Secondary | ICD-10-CM

## 2016-10-20 DIAGNOSIS — I872 Venous insufficiency (chronic) (peripheral): Secondary | ICD-10-CM | POA: Diagnosis not present

## 2016-10-21 ENCOUNTER — Other Ambulatory Visit: Payer: Self-pay | Admitting: Family Medicine

## 2016-10-21 DIAGNOSIS — G629 Polyneuropathy, unspecified: Secondary | ICD-10-CM

## 2016-10-21 NOTE — Telephone Encounter (Signed)
Pt contacted office for refill request on the following medications:  HYDROcodone-acetaminophen (NORCO) 7.5-325 MG tablet.  CB#[id number]

## 2016-10-21 NOTE — Telephone Encounter (Signed)
Last ov 08/27/16 Last filled 09/22/16 Please review. Thank you. sd

## 2016-10-22 MED ORDER — HYDROCODONE-ACETAMINOPHEN 7.5-325 MG PO TABS: ORAL_TABLET | ORAL | 0 refills | Status: DC

## 2016-10-23 ENCOUNTER — Other Ambulatory Visit: Payer: Self-pay | Admitting: Family Medicine

## 2016-10-29 ENCOUNTER — Encounter (INDEPENDENT_AMBULATORY_CARE_PROVIDER_SITE_OTHER): Payer: Self-pay

## 2016-10-29 ENCOUNTER — Ambulatory Visit (INDEPENDENT_AMBULATORY_CARE_PROVIDER_SITE_OTHER): Payer: Commercial Managed Care - PPO | Admitting: Vascular Surgery

## 2016-10-29 VITALS — BP 135/75 | HR 80 | Resp 17 | Wt 226.0 lb

## 2016-10-29 DIAGNOSIS — R600 Localized edema: Secondary | ICD-10-CM | POA: Diagnosis not present

## 2016-10-29 NOTE — Progress Notes (Signed)
History of Present Illness  There is no documented history at this time  Assessments & Plan   There are no diagnoses linked to this encounter.    Additional instructions  Subjective:  Patient presents with venous ulcer of the Left lower extremity.    Procedure:  3 layer unna wrap was placed Left lower extremity.   Plan:   Follow up in one week.  

## 2016-11-05 ENCOUNTER — Ambulatory Visit (INDEPENDENT_AMBULATORY_CARE_PROVIDER_SITE_OTHER): Payer: Commercial Managed Care - PPO | Admitting: Vascular Surgery

## 2016-11-05 DIAGNOSIS — I83023 Varicose veins of left lower extremity with ulcer of ankle: Secondary | ICD-10-CM

## 2016-11-05 DIAGNOSIS — L97329 Non-pressure chronic ulcer of left ankle with unspecified severity: Secondary | ICD-10-CM | POA: Diagnosis not present

## 2016-11-05 NOTE — Progress Notes (Signed)
History of Present Illness  There is no documented history at this time  Assessments & Plan   There are no diagnoses linked to this encounter.    Additional instructions  Subjective:  Patient presents with venous ulcer of the Left lower extremity.    Procedure:  3 layer unna wrap was placed Left lower extremity.   Plan:   Follow up in one week.  

## 2016-11-09 ENCOUNTER — Other Ambulatory Visit: Payer: Self-pay | Admitting: Family Medicine

## 2016-11-09 DIAGNOSIS — G629 Polyneuropathy, unspecified: Secondary | ICD-10-CM

## 2016-11-12 ENCOUNTER — Encounter (INDEPENDENT_AMBULATORY_CARE_PROVIDER_SITE_OTHER): Payer: Self-pay | Admitting: Vascular Surgery

## 2016-11-12 ENCOUNTER — Ambulatory Visit (INDEPENDENT_AMBULATORY_CARE_PROVIDER_SITE_OTHER): Payer: Commercial Managed Care - PPO | Admitting: Vascular Surgery

## 2016-11-12 VITALS — BP 127/68 | HR 76 | Resp 15 | Ht 72.0 in | Wt 222.0 lb

## 2016-11-12 DIAGNOSIS — I890 Lymphedema, not elsewhere classified: Secondary | ICD-10-CM

## 2016-11-12 NOTE — Progress Notes (Signed)
History of Present Illness  There is no documented history at this time  Assessments & Plan   There are no diagnoses linked to this encounter.    Additional instructions  Subjective:  Patient presents with venous ulcer of the Left lower extremity.    Procedure:  3 layer unna wrap was placed Left lower extremity.   Plan:   Follow up in one week.  

## 2016-11-20 ENCOUNTER — Ambulatory Visit (INDEPENDENT_AMBULATORY_CARE_PROVIDER_SITE_OTHER): Payer: Commercial Managed Care - PPO | Admitting: Vascular Surgery

## 2016-11-20 ENCOUNTER — Ambulatory Visit (INDEPENDENT_AMBULATORY_CARE_PROVIDER_SITE_OTHER): Payer: Commercial Managed Care - PPO | Admitting: Family Medicine

## 2016-11-20 ENCOUNTER — Other Ambulatory Visit: Payer: Self-pay | Admitting: Family Medicine

## 2016-11-20 ENCOUNTER — Encounter: Payer: Self-pay | Admitting: Family Medicine

## 2016-11-20 ENCOUNTER — Encounter (INDEPENDENT_AMBULATORY_CARE_PROVIDER_SITE_OTHER): Payer: Self-pay | Admitting: Vascular Surgery

## 2016-11-20 VITALS — BP 164/84 | HR 64 | Temp 97.7°F | Resp 16 | Ht 72.0 in | Wt 222.0 lb

## 2016-11-20 VITALS — BP 140/85 | HR 92 | Resp 16 | Ht 74.0 in | Wt 220.0 lb

## 2016-11-20 DIAGNOSIS — G629 Polyneuropathy, unspecified: Secondary | ICD-10-CM | POA: Diagnosis not present

## 2016-11-20 DIAGNOSIS — I890 Lymphedema, not elsewhere classified: Secondary | ICD-10-CM

## 2016-11-20 DIAGNOSIS — I8393 Asymptomatic varicose veins of bilateral lower extremities: Secondary | ICD-10-CM | POA: Diagnosis not present

## 2016-11-20 DIAGNOSIS — R42 Dizziness and giddiness: Secondary | ICD-10-CM

## 2016-11-20 DIAGNOSIS — R600 Localized edema: Secondary | ICD-10-CM | POA: Diagnosis not present

## 2016-11-20 DIAGNOSIS — I872 Venous insufficiency (chronic) (peripheral): Secondary | ICD-10-CM | POA: Diagnosis not present

## 2016-11-20 DIAGNOSIS — E1159 Type 2 diabetes mellitus with other circulatory complications: Secondary | ICD-10-CM | POA: Diagnosis not present

## 2016-11-20 DIAGNOSIS — R809 Proteinuria, unspecified: Secondary | ICD-10-CM | POA: Diagnosis not present

## 2016-11-20 DIAGNOSIS — F3342 Major depressive disorder, recurrent, in full remission: Secondary | ICD-10-CM | POA: Diagnosis not present

## 2016-11-20 DIAGNOSIS — I10 Essential (primary) hypertension: Secondary | ICD-10-CM

## 2016-11-20 DIAGNOSIS — I739 Peripheral vascular disease, unspecified: Secondary | ICD-10-CM | POA: Diagnosis not present

## 2016-11-20 LAB — CBC
HEMATOCRIT: 39.4 % (ref 38.5–50.0)
Hemoglobin: 13.4 g/dL (ref 13.2–17.1)
MCH: 31.9 pg (ref 27.0–33.0)
MCHC: 34 g/dL (ref 32.0–36.0)
MCV: 93.8 fL (ref 80.0–100.0)
MPV: 10.5 fL (ref 7.5–12.5)
Platelets: 172 10*3/uL (ref 140–400)
RBC: 4.2 10*6/uL (ref 4.20–5.80)
RDW: 12.7 % (ref 11.0–15.0)
WBC: 4.5 10*3/uL (ref 3.8–10.8)

## 2016-11-20 LAB — COMPREHENSIVE METABOLIC PANEL
AG RATIO: 2 (calc) (ref 1.0–2.5)
ALBUMIN MSPROF: 4.5 g/dL (ref 3.6–5.1)
ALKALINE PHOSPHATASE (APISO): 96 U/L (ref 40–115)
ALT: 18 U/L (ref 9–46)
AST: 20 U/L (ref 10–35)
BILIRUBIN TOTAL: 0.6 mg/dL (ref 0.2–1.2)
BUN: 14 mg/dL (ref 7–25)
CALCIUM: 9.3 mg/dL (ref 8.6–10.3)
CO2: 27 mmol/L (ref 20–32)
Chloride: 103 mmol/L (ref 98–110)
Creat: 0.9 mg/dL (ref 0.70–1.33)
GLOBULIN: 2.2 g/dL (ref 1.9–3.7)
Glucose, Bld: 142 mg/dL — ABNORMAL HIGH (ref 65–99)
POTASSIUM: 4.1 mmol/L (ref 3.5–5.3)
Sodium: 139 mmol/L (ref 135–146)
Total Protein: 6.7 g/dL (ref 6.1–8.1)

## 2016-11-20 LAB — POCT UA - MICROALBUMIN: Microalbumin Ur, POC: 50 mg/L

## 2016-11-20 LAB — POCT GLYCOSYLATED HEMOGLOBIN (HGB A1C)
Est. average glucose Bld gHb Est-mCnc: 131
HEMOGLOBIN A1C: 6.2

## 2016-11-20 LAB — SPECIMEN ID NOTIFICATION MISSING 2ND ID

## 2016-11-20 MED ORDER — MECLIZINE HCL 25 MG PO TABS: 25.0000 mg | ORAL_TABLET | Freq: Three times a day (TID) | ORAL | 0 refills | Status: AC | PRN

## 2016-11-20 MED ORDER — IRBESARTAN 75 MG PO TABS: 150.0000 mg | ORAL_TABLET | Freq: Every day | ORAL | 1 refills | Status: DC

## 2016-11-20 MED ORDER — HYDROCODONE-ACETAMINOPHEN 7.5-325 MG PO TABS: ORAL_TABLET | ORAL | 0 refills | Status: DC

## 2016-11-20 NOTE — Progress Notes (Signed)
Patient: Dustin Guerra Male    DOB: September 18, 1955   61 y.o.   MRN: [id number] Visit Date: 11/20/2016  Today's Provider: Lelon Huh, MD   Chief Complaint  Patient presents with  . Follow-up  . Diabetes  . Hypertension   Subjective:    HPI   Diabetes Mellitus Type II, Follow-up:   Lab Results  Component Value Date   HGBA1C 6.0 06/06/2016   HGBA1C 6.0 12/07/2015   HGBA1C 5.8 08/20/2012   Last seen for diabetes 3 months ago.  Management since then includes; no changes. He reports good compliance with treatment. He is not having side effects. none Current symptoms include none and have been unchanged. Home blood sugar records: fasting range: not checking  Episodes of hypoglycemia? no   Current Insulin Regimen: n/a Most Recent Eye Exam: 2016  Weight trend: stable Prior visit with dietician: no Current diet: well balanced Current exercise: gum  ------------------------------------------------------------------------   Hypertension, follow-up:  BP Readings from Last 3 Encounters:  11/20/16 (!) 164/84  11/12/16 127/68  10/29/16 135/75    He was last seen for hypertension 3 months ago.  BP at that visit was 126/74. Management since that visit includes; no changes.He reports good compliance with treatment. He is not having side effects. none He is exercising. He is adherent to low salt diet.   Outside blood pressures are not checking. He is experiencing none.  Patient denies none.   Cardiovascular risk factors include diabetes. Use of agents associated with hypertension: none.   ------------------------------------------------------------------------  Chronic, continuous use of opioids From 08/20/2016-no changes. States current medications remain effective for neuropathic and arthritis pains.    patient has been having episodes of dizziness, nausea for last several weeks. When he turns his head and feels like he is spinning even after he stops  turning. Feels a little off balance when walking.    Allergies  Allergen Reactions  . Codeine Itching     Current Outpatient Prescriptions:  .  aspirin 81 MG tablet, Take by mouth., Disp: , Rfl:  .  Azelaic Acid (FINACEA) 15 % cream, After skin is thoroughly washed and patted dry, gently but thoroughly massage a thin film of azelaic acid cream into the affected area twice daily, in the morning and evening., Disp: 50 g, Rfl: 5 .  cilostazol (PLETAL) 100 MG tablet, TAKE 1 TABLET (100 MG TOTAL) BY MOUTH 2 (TWO) TIMES DAILY AS NEEDED (FOR CIRCULATION)., Disp: 60 tablet, Rfl: 5 .  diazepam (VALIUM) 10 MG tablet, TAKE ONE TABLET BY MOUTH DAILY AS NEEDED FOR ANXIETY, Disp: 30 tablet, Rfl: 5 .  docusate sodium (COLACE) 100 MG capsule, Take 100 mg by mouth 2 (two) times daily., Disp: , Rfl:  .  EPINEPHrine (EPIPEN 2-PAK) 0.3 mg/0.3 mL IJ SOAJ injection, , Disp: , Rfl:  .  gabapentin (NEURONTIN) 300 MG capsule, TAKE TWO CAPSULES BY MOUTH THREE TIMES A DAY, Disp: 180 capsule, Rfl: 4 .  HYDROcodone-acetaminophen (NORCO) 7.5-325 MG tablet, 1/2-1 tablet up to four times a day as needed, Disp: 120 tablet, Rfl: 0 .  indomethacin (INDOCIN) 50 MG capsule, Take 1 capsule (50 mg total) by mouth 2 (two) times daily with a meal. As needed for pain. Do NOT take in addition to meloxicam, Disp: 60 capsule, Rfl: 5 .  irbesartan (AVAPRO) 75 MG tablet, Take 1 tablet (75 mg total) by mouth daily. REPLACES VALSARTAN 80MG , Disp: 30 tablet, Rfl: 5 .  IRON PO, Take by mouth., Disp: ,  Rfl:  .  lansoprazole (PREVACID) 15 MG capsule, Take 15 mg by mouth daily at 12 noon., Disp: , Rfl:  .  Multiple Vitamins-Minerals (CENTRUM SILVER) CHEW, Chew by mouth., Disp: , Rfl:  .  sildenafil (VIAGRA) 100 MG tablet, Take 0.5-1 tablets (50-100 mg total) by mouth daily as needed for erectile dysfunction., Disp: 8 tablet, Rfl: 12 .  terbinafine (LAMISIL) 250 MG tablet, TAKE TWO TABLETS BY MOUTH DAILY FOR 7 DAYS EACH MONTH, Disp: 14 tablet,  Rfl: 2 .  traMADol (ULTRAM) 50 MG tablet, TAKE 1 TABLET BY MOUTH 3 TIMES A DAY AS NEEDED, Disp: 90 tablet, Rfl: 5  Review of Systems  Constitutional: Negative for appetite change, chills and fever.  Respiratory: Negative for chest tightness, shortness of breath and wheezing.   Cardiovascular: Negative for chest pain and palpitations.  Gastrointestinal: Positive for nausea. Negative for abdominal pain and vomiting.  Neurological: Positive for dizziness and light-headedness.    Social History  Substance Use Topics  . Smoking status: Former Research scientist (life sciences)  . Smokeless tobacco: Never Used     Comment: quit 2000, smoked about 1.5 packs for about 5 years  . Alcohol use No   Objective:   BP (!) 164/84 (BP Location: Right Arm, Patient Position: Sitting, Cuff Size: Large)   Pulse 64   Temp 97.7 F (36.5 C) (Oral)   Resp 16   Ht 6' (1.829 m)   Wt 222 lb (100.7 kg)   SpO2 98%   BMI 30.11 kg/m  Vitals:   11/20/16 1115  BP: (!) 164/84  Pulse: 64  Resp: 16  Temp: 97.7 F (36.5 C)  TempSrc: Oral  SpO2: 98%  Weight: 222 lb (100.7 kg)  Height: 6' (1.829 m)     Physical Exam   General Appearance:    Alert, cooperative, no distress  Eyes:    PERRL, conjunctiva/corneas clear, EOM's intact       Lungs:     Clear to auscultation bilaterally, respirations unlabored  Heart:    Regular rate and rhythm  Neurologic:   Awake, alert, oriented x 3. No apparent focal neurological           defect.       Results for orders placed or performed in visit on 11/20/16  POCT glycosylated hemoglobin (Hb A1C)  Result Value Ref Range   Hemoglobin A1C 6.2    Est. average glucose Bld gHb Est-mCnc 131   POCT UA - Microalbumin  Result Value Ref Range   Microalbumin Ur, POC 50 mg/L        Assessment & Plan:     1. Type 2 diabetes mellitus without complication, without long-term current use of insulin (HCC) Well controlled.  Continue current medications.   - POCT glycosylated hemoglobin (Hb A1C) -  POCT UA - Microalbumin  2. PVD (peripheral vascular disease) (HCC) Stable. Continue current medications.    3. Recurrent major depressive disorder, in full remission ([place]) Well controlled.  Continue current medications.    4. Neuropathy Continue current pain control regiment.  - HYDROcodone-acetaminophen (NORCO) 7.5-325 MG tablet; 1/2-1 tablet up to four times a day as needed  Dispense: 120 tablet; Refill: 0  5. Dizziness C/w BPV. Check labs. Try meclizine, consider neuroimaging if not effective.  - Comprehensive metabolic panel - CBC - meclizine (ANTIVERT) 25 MG tablet; Take 1 tablet (25 mg total) by mouth 3 (three) times daily as needed for dizziness.  Dispense: 30 tablet; Refill: 0  6. Essential hypertension Tolerating change from valsartan  to irbesartan, increase to 150mg  daily.  - irbesartan (AVAPRO) 150 MG tablet; Take 1 tablet (150 mg total) by mouth daily.  Dispense: 30 tablet; Refill: 5  7. Microalbuminuria Increase ARB as above.   Refused flu vaccine      Lelon Huh, MD  [facility]

## 2016-11-20 NOTE — Telephone Encounter (Signed)
Please call in zolpidem  

## 2016-11-20 NOTE — Telephone Encounter (Signed)
Pt called to say he does not use [facility] any more.  He uses [facility].  He thinks [facility] was sent to [facility] because [facility] got [facility]  [facility],.  Thanks [name]

## 2016-11-20 NOTE — Patient Instructions (Addendum)
   Start taking 2 x 75mg  irbesartan until you get the refill from [facility] later this month

## 2016-11-21 DIAGNOSIS — R809 Proteinuria, unspecified: Secondary | ICD-10-CM | POA: Insufficient documentation

## 2016-11-21 MED ORDER — IRBESARTAN 150 MG PO TABS: 150.0000 mg | ORAL_TABLET | Freq: Every day | ORAL | 5 refills | Status: DC

## 2016-11-21 NOTE — Telephone Encounter (Signed)
Rx called in to pharmacy. 

## 2016-11-22 NOTE — Progress Notes (Signed)
MRN : [id number]  Dustin Guerra is a 61 y.o. (Oct 16, 1955) male who presents with chief complaint of  Chief Complaint  Patient presents with  . Routine Post Op    3-4 week post laser  .  History of Present Illness: The patient returns to the office for followup status post laser ablation.  The patient note significant improvement in the lower extremity pain but not resolution of the symptoms. The patient notes multiple residual varicosities bilaterally which continued to hurt with dependent positions and remained tender to palpation. The patient's swelling is minimally from preoperative status. The patient continues to wear graduated compression stockings on a daily basis but these are not eliminating the pain and discomfort. The patient continues to use over-the-counter anti-inflammatory medications to treat the pain and related symptoms but this has not given the patient relief. The patient notes the pain in the lower extremities is causing problems with daily exercise, problems at work and even with household activities such as preparing meals and doing dishes.  The patient is otherwise done well and there have been no complications related to the laser procedure or interval changes in the patient's overall    No outpatient prescriptions have been marked as taking for the 11/20/16 encounter (Office Visit) with Delana Meyer, Dolores Lory, MD.    Past Medical History:  Diagnosis Date  . Anxiety   . Hypertension     Past Surgical History:  Procedure Laterality Date  . BACK SURGERY    . COLON SURGERY  2015   Colon resection due to diverticulosis  . VEIN LIGATION AND STRIPPING    . VEIN SURGERY     Vein stripping at age 34-27    Social History Social History  Substance Use Topics  . Smoking status: Former Research scientist (life sciences)  . Smokeless tobacco: Never Used     Comment: quit 2000, smoked about 1.5 packs for about 5 years  . Alcohol use No    Family History Family History  Problem  Relation Age of Onset  . Alcohol abuse Mother   . Esophageal cancer Father   . Heart failure Brother     Allergies  Allergen Reactions  . Codeine Itching     REVIEW OF SYSTEMS (Negative unless checked)  Constitutional: [] Weight loss  [] Fever  [] Chills Cardiac: [] Chest pain   [] Chest pressure   [] Palpitations   [] Shortness of breath when laying flat   [] Shortness of breath with exertion. Vascular:  [] Pain in legs with walking   [] Pain in legs at rest  [] History of DVT   [] Phlebitis   [x] Swelling in legs   [x] Varicose veins   [] Non-healing ulcers Pulmonary:   [] Uses home oxygen   [] Productive cough   [] Hemoptysis   [] Wheeze  [] COPD   [] Asthma Neurologic:  [] Dizziness   [] Seizures   [] History of stroke   [] History of TIA  [] Aphasia   [] Vissual changes   [] Weakness or numbness in arm   [] Weakness or numbness in leg Musculoskeletal:   [] Joint swelling   [] Joint pain   [] Low back pain Hematologic:  [] Easy bruising  [] Easy bleeding   [] Hypercoagulable state   [] Anemic Gastrointestinal:  [] Diarrhea   [] Vomiting  [] Gastroesophageal reflux/heartburn   [] Difficulty swallowing. Genitourinary:  [] Chronic kidney disease   [] Difficult urination  [] Frequent urination   [] Blood in urine Skin:  [] Rashes   [] Ulcers  Psychological:  [] History of anxiety   []  History of major depression.  Physical Examination  Vitals:   11/20/16 1321  BP: 140/85  Pulse: 92  Resp: 16  Weight: 99.8 kg (220 lb)  Height: 6\' 2"  (1.88 m)   Body mass index is 28.25 kg/m. Gen: WD/WN, NAD Head: [place]/AT, No temporalis wasting.  Ear/Nose/Throat: Hearing grossly intact, nares w/o erythema or drainage Eyes: PER, EOMI, sclera nonicteric.  Neck: Supple, no large masses.   Pulmonary:  Good air movement, no audible wheezing bilaterally, no use of accessory muscles.  Cardiac: RRR, no JVD Vascular: Large varicosities present extensively greater than 10 mm leg leg. Severe venous stasis changes to the left leg.  2+ soft pitting  edema Vessel Right Left  Radial Palpable Palpable  PT Palpable Palpable  DP Palpable Palpable  Gastrointestinal: Non-distended. No guarding/no peritoneal signs.  Musculoskeletal: M/S 5/5 throughout.  No deformity or atrophy.  Neurologic: CN 2-12 intact. Symmetrical.  Speech is fluent. Motor exam as listed above. Psychiatric: Judgment intact, Mood & affect appropriate for pt's clinical situation. Dermatologic: venous rashes left healed ulcer noted.  No changes consistent with cellulitis. Lymph : No lichenification or skin changes of chronic lymphedema.  CBC Lab Results  Component Value Date   WBC 4.5 11/20/2016   HGB 13.4 11/20/2016   HCT 39.4 11/20/2016   MCV 93.8 11/20/2016   PLT 172 11/20/2016    BMET    Component Value Date/Time   NA 139 11/20/2016 1153   NA 140 08/22/2016 1632   NA 140 06/04/2012 0610   K 4.1 11/20/2016 1153   K 3.7 06/04/2012 0610   CL 103 11/20/2016 1153   CL 107 06/04/2012 0610   CO2 27 11/20/2016 1153   CO2 26 06/04/2012 0610   GLUCOSE 142 (H) 11/20/2016 1153   GLUCOSE 99 06/04/2012 0610   BUN 14 11/20/2016 1153   BUN 31 (H) 08/22/2016 1632   BUN 9 06/04/2012 0610   CREATININE 0.90 11/20/2016 1153   CALCIUM 9.3 11/20/2016 1153   CALCIUM 7.8 (L) 06/04/2012 0610   GFRNONAA 78 08/22/2016 1632   GFRNONAA >60 06/04/2012 0610   GFRAA 90 08/22/2016 1632   GFRAA >60 06/04/2012 0610   Estimated Creatinine Clearance: 111.5 mL/min (by C-G formula based on SCr of 0.9 mg/dL).  COAG No results found for: INR, PROTIME  Radiology No results found.  Assessment/Plan 1. Varicose veins of both lower extremities Recommend:  The patient has had successful ablation of the previously incompetent saphenous venous system but still has persistent symptoms of pain and swelling that are having a negative impact on daily life and daily activities.  Patient should undergo injection sclerotherapy to treat the residual varicosities.  The risks, benefits and  alternative therapies were reviewed in detail with the patient.  All questions were answered.  The patient agrees to proceed with sclerotherapy at their convenience.  The patient will continue wearing the graduated compression stockings and using the over-the-counter pain medications to treat her symptoms.    2. Venous insufficiency of both lower extremities No surgery or intervention at this point in time.    I have had a long discussion with the patient regarding venous insufficiency and why it  causes symptoms. I have discussed with the patient the chronic skin changes that accompany venous insufficiency and the long term sequela such as infection and ulceration.  Patient will begin wearing graduated compression stockings class 1 (20-30 mmHg) or compression wraps on a daily basis a prescription was given. The patient will put the stockings on first thing in the morning and removing them in the evening. The patient is instructed specifically not  to sleep in the stockings.    In addition, behavioral modification including several periods of elevation of the lower extremities during the day will be continued. I have demonstrated that proper elevation is a position with the ankles at heart level.  The patient is instructed to begin routine exercise, especially walking on a daily basis  Following the review of the ultrasound the patient will follow up in 2-3 months to reassess the degree of swelling and the control that graduated compression stockings or compression wraps  is offering.   The patient can be assessed for a Lymph Pump at that time  3. Bilateral lower extremity edema See #1&2  4. Lymphedema  No surgery or intervention at this point in time.    I have reviewed my previous discussion with the patient regarding swelling and why it  causes symptoms.  The patient is doing well with compression and will continue wearing graduated compression stockings class 1 (20-30 mmHg) on a daily basis  a prescription was given. The patient will  continue wearing the stockings first thing in the morning and removing them in the evening. The patient is instructed specifically not to sleep in the stockings.    In addition, behavioral modification including elevation during the day and exercise will be continued.     Hortencia Pilar, MD  11/22/2016 9:58 PM

## 2016-11-27 ENCOUNTER — Other Ambulatory Visit (INDEPENDENT_AMBULATORY_CARE_PROVIDER_SITE_OTHER): Payer: Commercial Managed Care - PPO | Admitting: Vascular Surgery

## 2016-12-01 ENCOUNTER — Ambulatory Visit: Payer: Commercial Managed Care - PPO | Admitting: Family Medicine

## 2016-12-01 ENCOUNTER — Encounter (INDEPENDENT_AMBULATORY_CARE_PROVIDER_SITE_OTHER): Payer: Commercial Managed Care - PPO

## 2016-12-12 ENCOUNTER — Ambulatory Visit: Payer: Commercial Managed Care - PPO | Admitting: Family Medicine

## 2016-12-17 ENCOUNTER — Other Ambulatory Visit: Payer: Self-pay | Admitting: Family Medicine

## 2016-12-17 DIAGNOSIS — G629 Polyneuropathy, unspecified: Secondary | ICD-10-CM

## 2016-12-18 ENCOUNTER — Other Ambulatory Visit: Payer: Self-pay | Admitting: Family Medicine

## 2016-12-18 DIAGNOSIS — G629 Polyneuropathy, unspecified: Secondary | ICD-10-CM

## 2016-12-18 MED ORDER — HYDROCODONE-ACETAMINOPHEN 7.5-325 MG PO TABS: ORAL_TABLET | ORAL | 0 refills | Status: DC

## 2016-12-18 MED ORDER — GABAPENTIN 600 MG PO TABS: 600.0000 mg | ORAL_TABLET | Freq: Three times a day (TID) | ORAL | 3 refills | Status: DC

## 2016-12-18 NOTE — Telephone Encounter (Signed)
See message below °

## 2016-12-18 NOTE — Telephone Encounter (Signed)
Rx called in to pharmacy. 

## 2016-12-18 NOTE — Telephone Encounter (Signed)
Pt contacted office for refill request on the following medications:  HYDROcodone-acetaminophen (NORCO) 7.5-325  MG tablet  gabapentin (NEURONTIN) 300 MG capsule.  Pt states the insurance will only cover 1/2 of this Rx.  Pt states if the 300MG  can be changed to 600MG  and he takes 1 pill 3 times a day the insurance will cover the whole Rx.  [facility].   CB#[phone]/MW

## 2016-12-18 NOTE — Telephone Encounter (Signed)
Please call in tramadol.  

## 2016-12-25 ENCOUNTER — Ambulatory Visit (INDEPENDENT_AMBULATORY_CARE_PROVIDER_SITE_OTHER): Payer: Commercial Managed Care - PPO | Admitting: Vascular Surgery

## 2017-01-12 ENCOUNTER — Other Ambulatory Visit: Payer: Self-pay | Admitting: Family Medicine

## 2017-01-12 DIAGNOSIS — I739 Peripheral vascular disease, unspecified: Secondary | ICD-10-CM

## 2017-01-13 NOTE — Telephone Encounter (Signed)
Pharmacy requesting refills. Thanks!  

## 2017-01-16 ENCOUNTER — Other Ambulatory Visit: Payer: Self-pay | Admitting: Family Medicine

## 2017-01-16 DIAGNOSIS — G629 Polyneuropathy, unspecified: Secondary | ICD-10-CM

## 2017-01-16 MED ORDER — TERBINAFINE HCL 250 MG PO TABS: ORAL_TABLET | ORAL | 5 refills | Status: DC

## 2017-01-16 MED ORDER — HYDROCODONE-ACETAMINOPHEN 7.5-325 MG PO TABS: ORAL_TABLET | ORAL | 0 refills | Status: DC

## 2017-01-16 NOTE — Telephone Encounter (Signed)
Pt contacted office for refill request on the following medications:  1. HYDROcodone-acetaminophen (NORCO) 7.5-325 MG tablet Last Rx: 12/18/16 2. terbinafine (LAMISIL) 250 MG tablet  Last Rx: 10/23/16  LOV: 11/20/16  [facility]  Pt stated that he would like to pick up the Rx for Hydrocodone today if possible. Please advise. Thanks TNP

## 2017-01-19 ENCOUNTER — Ambulatory Visit (INDEPENDENT_AMBULATORY_CARE_PROVIDER_SITE_OTHER): Payer: Commercial Managed Care - PPO | Admitting: Vascular Surgery

## 2017-02-04 ENCOUNTER — Other Ambulatory Visit: Payer: Self-pay | Admitting: Family Medicine

## 2017-02-12 ENCOUNTER — Other Ambulatory Visit: Payer: Self-pay | Admitting: Family Medicine

## 2017-02-12 DIAGNOSIS — G629 Polyneuropathy, unspecified: Secondary | ICD-10-CM

## 2017-02-12 NOTE — Telephone Encounter (Signed)
Hyfrocodone 7.5-325 sent to [facility] [address] per the patient he will be out on the 2nd but wanted to go ahead and call.    States he wants to make sure we send in the one without ibuprofen in it.

## 2017-02-13 MED ORDER — HYDROCODONE-ACETAMINOPHEN 7.5-325 MG PO TABS: ORAL_TABLET | ORAL | 0 refills | Status: DC

## 2017-02-15 NOTE — Progress Notes (Signed)
    MRN : [id number]  Dustin Guerra is a 61 y.o. (01-23-1956) male who presents with chief complaint of No chief complaint on file. .   Procedure:  Sclerotherapy using hypertonic saline mixed with 1% Lidocaine was performed on lower extremities bilateral.  Compression wraps were placed.  The patient tolerated the procedure well.  Plan:  Follow up as arranged

## 2017-02-16 ENCOUNTER — Encounter (INDEPENDENT_AMBULATORY_CARE_PROVIDER_SITE_OTHER): Payer: Self-pay | Admitting: Vascular Surgery

## 2017-02-16 ENCOUNTER — Ambulatory Visit (INDEPENDENT_AMBULATORY_CARE_PROVIDER_SITE_OTHER): Payer: Commercial Managed Care - PPO | Admitting: Vascular Surgery

## 2017-02-16 VITALS — BP 159/85 | HR 91 | Resp 16 | Ht 73.0 in | Wt 227.0 lb

## 2017-02-16 DIAGNOSIS — I872 Venous insufficiency (chronic) (peripheral): Secondary | ICD-10-CM

## 2017-02-16 DIAGNOSIS — I83813 Varicose veins of bilateral lower extremities with pain: Secondary | ICD-10-CM

## 2017-03-02 ENCOUNTER — Encounter (INDEPENDENT_AMBULATORY_CARE_PROVIDER_SITE_OTHER): Payer: Self-pay | Admitting: Vascular Surgery

## 2017-03-02 ENCOUNTER — Ambulatory Visit (INDEPENDENT_AMBULATORY_CARE_PROVIDER_SITE_OTHER): Payer: Commercial Managed Care - PPO | Admitting: Vascular Surgery

## 2017-03-02 VITALS — BP 145/84 | HR 78 | Resp 17 | Wt 232.0 lb

## 2017-03-02 DIAGNOSIS — I83813 Varicose veins of bilateral lower extremities with pain: Secondary | ICD-10-CM | POA: Diagnosis not present

## 2017-03-02 NOTE — Progress Notes (Signed)
Varicose veins of left lower extremity with inflammation (454.1  I83.10) Current Plans   Indication: Patient presents with symptomatic varicose veins of the left lower extremity.   Procedure: Sclerotherapy using hypertonic saline mixed with 1% Lidocaine was performed on the left lower extremity. Compression wraps were placed. The patient tolerated the procedure well. 

## 2017-03-12 ENCOUNTER — Ambulatory Visit (INDEPENDENT_AMBULATORY_CARE_PROVIDER_SITE_OTHER): Payer: Commercial Managed Care - PPO | Admitting: Vascular Surgery

## 2017-03-18 ENCOUNTER — Telehealth: Payer: Self-pay | Admitting: Family Medicine

## 2017-03-18 DIAGNOSIS — G629 Polyneuropathy, unspecified: Secondary | ICD-10-CM

## 2017-03-18 MED ORDER — HYDROCODONE-ACETAMINOPHEN 7.5-325 MG PO TABS: ORAL_TABLET | ORAL | 0 refills | Status: DC

## 2017-03-18 NOTE — Telephone Encounter (Signed)
Needs refill on Hydrocodone called to [facility]

## 2017-03-30 ENCOUNTER — Encounter (INDEPENDENT_AMBULATORY_CARE_PROVIDER_SITE_OTHER): Payer: Self-pay | Admitting: Vascular Surgery

## 2017-03-30 ENCOUNTER — Ambulatory Visit (INDEPENDENT_AMBULATORY_CARE_PROVIDER_SITE_OTHER): Payer: Commercial Managed Care - PPO | Admitting: Vascular Surgery

## 2017-03-30 VITALS — BP 147/84 | HR 76 | Resp 17 | Wt 228.0 lb

## 2017-03-30 DIAGNOSIS — I83813 Varicose veins of bilateral lower extremities with pain: Secondary | ICD-10-CM | POA: Diagnosis not present

## 2017-03-30 NOTE — Progress Notes (Signed)
Varicose veins of left lower extremity with inflammation (454.1  I83.10) Current Plans   Indication: Patient presents with symptomatic varicose veins of the left lower extremity.   Procedure: Sclerotherapy using hypertonic saline mixed with 1% Lidocaine was performed on the left lower extremity. Compression wraps were placed. The patient tolerated the procedure well. 

## 2017-04-17 ENCOUNTER — Other Ambulatory Visit: Payer: Self-pay | Admitting: Family Medicine

## 2017-04-17 DIAGNOSIS — G629 Polyneuropathy, unspecified: Secondary | ICD-10-CM

## 2017-04-17 MED ORDER — HYDROCODONE-ACETAMINOPHEN 7.5-325 MG PO TABS: ORAL_TABLET | ORAL | 0 refills | Status: DC

## 2017-04-17 NOTE — Telephone Encounter (Signed)
Patient is requesting a refill on the following medications  HYDROcodone-acetaminophen (NORCO) 7.5-325 MG tablet   He uses [facility] in [place].

## 2017-04-20 NOTE — Telephone Encounter (Signed)
Patient was advised.  

## 2017-04-23 ENCOUNTER — Encounter (INDEPENDENT_AMBULATORY_CARE_PROVIDER_SITE_OTHER): Payer: Self-pay | Admitting: Vascular Surgery

## 2017-04-23 ENCOUNTER — Encounter (INDEPENDENT_AMBULATORY_CARE_PROVIDER_SITE_OTHER): Payer: Self-pay

## 2017-04-23 ENCOUNTER — Ambulatory Visit (INDEPENDENT_AMBULATORY_CARE_PROVIDER_SITE_OTHER): Payer: Commercial Managed Care - PPO | Admitting: Vascular Surgery

## 2017-04-23 VITALS — BP 154/84 | HR 74 | Resp 18 | Ht 72.0 in | Wt 231.0 lb

## 2017-04-23 DIAGNOSIS — I872 Venous insufficiency (chronic) (peripheral): Secondary | ICD-10-CM

## 2017-04-23 DIAGNOSIS — I83813 Varicose veins of bilateral lower extremities with pain: Secondary | ICD-10-CM

## 2017-04-26 ENCOUNTER — Other Ambulatory Visit: Payer: Self-pay | Admitting: Family Medicine

## 2017-05-11 ENCOUNTER — Encounter (INDEPENDENT_AMBULATORY_CARE_PROVIDER_SITE_OTHER): Payer: Self-pay | Admitting: Vascular Surgery

## 2017-05-11 ENCOUNTER — Ambulatory Visit: Payer: Commercial Managed Care - PPO | Admitting: Family Medicine

## 2017-05-11 ENCOUNTER — Encounter: Payer: Self-pay | Admitting: Family Medicine

## 2017-05-11 ENCOUNTER — Ambulatory Visit (INDEPENDENT_AMBULATORY_CARE_PROVIDER_SITE_OTHER): Payer: Commercial Managed Care - PPO | Admitting: Vascular Surgery

## 2017-05-11 VITALS — BP 148/99 | HR 68 | Resp 16 | Ht 74.0 in | Wt 234.0 lb

## 2017-05-11 VITALS — BP 144/70 | HR 101 | Temp 97.4°F | Resp 16 | Wt 236.0 lb

## 2017-05-11 DIAGNOSIS — I872 Venous insufficiency (chronic) (peripheral): Secondary | ICD-10-CM | POA: Diagnosis not present

## 2017-05-11 DIAGNOSIS — I10 Essential (primary) hypertension: Secondary | ICD-10-CM | POA: Diagnosis not present

## 2017-05-11 DIAGNOSIS — E1159 Type 2 diabetes mellitus with other circulatory complications: Secondary | ICD-10-CM

## 2017-05-11 DIAGNOSIS — G8929 Other chronic pain: Secondary | ICD-10-CM

## 2017-05-11 DIAGNOSIS — M545 Low back pain: Secondary | ICD-10-CM | POA: Diagnosis not present

## 2017-05-11 DIAGNOSIS — I83813 Varicose veins of bilateral lower extremities with pain: Secondary | ICD-10-CM

## 2017-05-11 DIAGNOSIS — G629 Polyneuropathy, unspecified: Secondary | ICD-10-CM

## 2017-05-11 LAB — POCT GLYCOSYLATED HEMOGLOBIN (HGB A1C)
ESTIMATED AVERAGE GLUCOSE: 117
HEMOGLOBIN A1C: 5.7

## 2017-05-11 MED ORDER — HYDROCHLOROTHIAZIDE 12.5 MG PO TABS: 12.5000 mg | ORAL_TABLET | Freq: Every day | ORAL | 1 refills | Status: DC

## 2017-05-11 NOTE — Progress Notes (Signed)
Patient: Dustin Guerra Male    DOB: 11/30/1955   62 y.o.   MRN: [id number] Visit Date: 05/11/2017  Today's Provider: Lelon Huh, MD   Chief Complaint  Patient presents with  . Hypertension  . Diabetes  . Depression   Subjective:    HPI  Hypertension, follow-up:  BP Readings from Last 3 Encounters:  05/11/17 (!) 148/99  04/23/17 (!) 154/84  03/30/17 (!) 147/84    He was last seen for hypertension 5 months ago.  BP at that visit was 164/84. Management since that visit includes increasing Irbesartan to 150mg . He reports good compliance with treatment. He is not having side effects.  He is exercising. He is adherent to low salt diet.   Outside blood pressures are high per patient report. He is experiencing none.  Patient denies chest pain, chest pressure/discomfort, claudication, dyspnea, exertional chest pressure/discomfort, fatigue, irregular heart beat, lower extremity edema, near-syncope, orthopnea, palpitations, paroxysmal nocturnal dyspnea, syncope and tachypnea.   Cardiovascular risk factors include advanced age (older than 96 for men, 59 for women).  Use of agents associated with hypertension: NSAIDS.     Weight trend: stable Wt Readings from Last 3 Encounters:  05/11/17 234 lb (106.1 kg)  04/23/17 231 lb (104.8 kg)  03/30/17 228 lb (103.4 kg)    Current diet: well balanced  ------------------------------------------------------------------------ Follow up of PVD:  Patient was last seen for this problem 5 months ago and no changes were made.     Diabetes Mellitus Type II, Follow-up:   Lab Results  Component Value Date   HGBA1C 6.2 11/20/2016   HGBA1C 6.0 06/06/2016   HGBA1C 6.0 12/07/2015    Last seen for diabetes 5 months ago.  Management since then includes no changes. He reports good compliance with treatment. He is not having side effects.  Current symptoms include none and have been stable. Home blood sugar records: home  blood sugars not being checked  Episodes of hypoglycemia? no   Current Insulin Regimen: none Most Recent Eye Exam: < 1 year ago Weight trend: stable Prior visit with dietician: no Current diet: well balanced Current exercise: weightlifting  Pertinent Labs:    Component Value Date/Time   CHOL 120 12/10/2015 1612   CHOL 85 06/04/2012 0610   TRIG 125 12/10/2015 1612   TRIG 109 06/04/2012 0610   HDL 28 (L) 12/10/2015 1612   HDL 24 (L) 06/04/2012 0610   LDLCALC 67 12/10/2015 1612   LDLCALC 39 06/04/2012 0610   CREATININE 0.90 11/20/2016 1153    Wt Readings from Last 3 Encounters:  05/11/17 234 lb (106.1 kg)  04/23/17 231 lb (104.8 kg)  03/30/17 228 lb (103.4 kg)    ------------------------------------------------------------------------ Follow up of Neuropathy:  Patient was last seen for this problem 5 months ago and no changes were made. Patient reports good compliance with treatment, good tolerance and good symptom control. Feels that gabapentin remains effective.       Allergies  Allergen Reactions  . Codeine Itching     Current Outpatient Medications:  .  aspirin 81 MG tablet, Take by mouth., Disp: , Rfl:  .  Azelaic Acid (FINACEA) 15 % cream, After skin is thoroughly washed and patted dry, gently but thoroughly massage a thin film of azelaic acid cream into the affected area twice daily, in the morning and evening., Disp: 50 g, Rfl: 5 .  cilostazol (PLETAL) 100 MG tablet, TAKE ONE TABLET BY MOUTH TWO TIMES A DAY AS NEEDED FOR  CIRCULATION, Disp: 60 tablet, Rfl: 12 .  diazepam (VALIUM) 10 MG tablet, TAKE ONE TABLET BY MOUTH DAILY AS NEEDED FOR ANXIETY, Disp: 30 tablet, Rfl: 5 .  docusate sodium (COLACE) 100 MG capsule, Take 100 mg by mouth 2 (two) times daily., Disp: , Rfl:  .  EPINEPHrine (EPIPEN 2-PAK) 0.3 mg/0.3 mL IJ SOAJ injection, as needed. , Disp: , Rfl:  .  FINACEA 15 % FOAM, , Disp: , Rfl:  .  gabapentin (NEURONTIN) 600 MG tablet, TAKE ONE TABLET BY MOUTH  THREE TIMES A DAY, Disp: 90 tablet, Rfl: 4 .  HYDROcodone-acetaminophen (NORCO) 7.5-325 MG tablet, 1/2-1 tablet up to four times a day as needed, Disp: 120 tablet, Rfl: 0 .  indomethacin (INDOCIN) 50 MG capsule, TAKE ONE CAPSULE BY MOUTH TWICE A DAY WITH MEALS AS NEEDED FOR PAIN DO NOT TAKE IN ADDITION TO MELOXICAM, Disp: 60 capsule, Rfl: 4 .  irbesartan (AVAPRO) 150 MG tablet, Take 1 tablet (150 mg total) by mouth daily., Disp: 30 tablet, Rfl: 5 .  IRON PO, Take by mouth., Disp: , Rfl:  .  lansoprazole (PREVACID) 15 MG capsule, Take 15 mg by mouth daily at 12 noon., Disp: , Rfl:  .  Multiple Vitamins-Minerals (CENTRUM SILVER) CHEW, Chew by mouth., Disp: , Rfl:  .  sildenafil (VIAGRA) 100 MG tablet, TAKE 0.5-1 TABLET BY MOUTH DAILY AS NEEDED FOR ERECTILE DYSFUNCTION, Disp: 5 tablet, Rfl: 8 .  terbinafine (LAMISIL) 250 MG tablet, TAKE TWO TABLETS BY MOUTH DAILY FOR 7 DAYS EACH MONTH, Disp: 14 tablet, Rfl: 5 .  traMADol (ULTRAM) 50 MG tablet, TAKE ONE TABLET BY MOUTH THREE TIMES A DAY AS NEEDED, Disp: 90 tablet, Rfl: 2 .  hydrochlorothiazide (HYDRODIURIL) 12.5 MG tablet, Take 1 tablet (12.5 mg total) by mouth daily., Disp: 90 tablet, Rfl: 1  Review of Systems  Constitutional: Negative for appetite change, chills and fever.  Respiratory: Negative for chest tightness, shortness of breath and wheezing.   Cardiovascular: Negative for chest pain and palpitations.  Gastrointestinal: Negative for abdominal pain, nausea and vomiting.    Social History   Tobacco Use  . Smoking status: Former Research scientist (life sciences)  . Smokeless tobacco: Never Used  . Tobacco comment: quit 2000, smoked about 1.5 packs for about 5 years  Substance Use Topics  . Alcohol use: No   Objective:   BP 136/72 (BP Location: Right Arm, Cuff Size: Large)   Pulse (!) 101   Temp (!) 97.4 F (36.3 C) (Oral)   Resp 16   Wt 236 lb (107 kg)   SpO2 96% Comment: room air  BMI 30.30 kg/m  Vitals:   05/11/17 1400 05/11/17 1403 05/11/17 1422    BP: 122/70 136/72 (!) 144/70  Pulse: (!) 101    Resp: 16    Temp: (!) 97.4 F (36.3 C)    TempSrc: Oral    SpO2: 96%    Weight: 236 lb (107 kg)       Physical Exam   General Appearance:    Alert, cooperative, no distress  Eyes:    PERRL, conjunctiva/corneas clear, EOM's intact       Lungs:     Clear to auscultation bilaterally, respirations unlabored  Heart:    Regular rate and rhythm, 1+ bipedal edema.   Neurologic:   Awake, alert, oriented x 3. No apparent focal neurological           defect.       Results for orders placed or performed in visit on 05/11/17  POCT HgB A1C  Result Value Ref Range   Hemoglobin A1C 5.7    Est. average glucose Bld gHb Est-mCnc 117         Assessment & Plan:     1. Type 2 diabetes mellitus with other circulatory complication, without long-term current use of insulin (HCC) Well controlled.   Continue current medications.    - POCT HgB A1C  2. Essential hypertension Add hctz 12.5mg  daily due to uncontrolled hypertension.  Recheck BP in 2 months.   3. Chronic bilateral low back pain without sciatica Stable on current regiment of pain medications and gabapentin  4. Venous insufficiency of both lower extremities Continue routine follow up with vascular surgery.   5. Neuropathy Continue current dose of gabapentin.        Lelon Huh, MD  [facility]

## 2017-05-19 ENCOUNTER — Other Ambulatory Visit: Payer: Self-pay | Admitting: Family Medicine

## 2017-05-19 DIAGNOSIS — G629 Polyneuropathy, unspecified: Secondary | ICD-10-CM

## 2017-05-19 MED ORDER — HYDROCODONE-ACETAMINOPHEN 7.5-325 MG PO TABS: ORAL_TABLET | ORAL | 0 refills | Status: DC

## 2017-05-19 NOTE — Telephone Encounter (Signed)
Pt requesting refill of hydrocodone 7.5-325 sent to [facility].

## 2017-05-29 ENCOUNTER — Telehealth: Payer: Self-pay | Admitting: Family Medicine

## 2017-05-29 MED ORDER — TELMISARTAN 40 MG PO TABS: 40.0000 mg | ORAL_TABLET | Freq: Every day | ORAL | 2 refills | Status: DC

## 2017-05-29 NOTE — Telephone Encounter (Signed)
Patient was advised. Expressed understanding.  

## 2017-05-29 NOTE — Telephone Encounter (Signed)
New prescription for telmisartan sent to [facility]

## 2017-05-29 NOTE — Telephone Encounter (Signed)
Please advise 

## 2017-05-29 NOTE — Telephone Encounter (Signed)
Dustin Guerra is calling because patient is on irbesartan (AVAPRO) 150 MG tablet which is on national back order and would like to know if you can send something else in for him.

## 2017-05-31 ENCOUNTER — Other Ambulatory Visit: Payer: Self-pay | Admitting: Family Medicine

## 2017-06-18 ENCOUNTER — Other Ambulatory Visit: Payer: Self-pay

## 2017-06-18 DIAGNOSIS — G629 Polyneuropathy, unspecified: Secondary | ICD-10-CM

## 2017-06-18 NOTE — Telephone Encounter (Signed)
Patient is requesting a a refill on HYDROcodone-acetaminophen ([place]) 7.5-325 MG tablet be sent to [place]. CB# [phone].

## 2017-06-19 MED ORDER — HYDROCODONE-ACETAMINOPHEN 7.5-325 MG PO TABS: ORAL_TABLET | ORAL | 0 refills | Status: DC

## 2017-06-19 NOTE — Telephone Encounter (Signed)
Please review

## 2017-07-09 ENCOUNTER — Other Ambulatory Visit: Payer: Self-pay | Admitting: Family Medicine

## 2017-07-17 ENCOUNTER — Other Ambulatory Visit: Payer: Self-pay | Admitting: Family Medicine

## 2017-07-17 ENCOUNTER — Ambulatory Visit: Payer: Commercial Managed Care - PPO | Admitting: Family Medicine

## 2017-07-17 ENCOUNTER — Encounter: Payer: Self-pay | Admitting: Family Medicine

## 2017-07-17 VITALS — BP 120/70 | HR 67 | Temp 97.6°F | Resp 16 | Ht 74.0 in | Wt 230.0 lb

## 2017-07-17 DIAGNOSIS — G629 Polyneuropathy, unspecified: Secondary | ICD-10-CM

## 2017-07-17 DIAGNOSIS — L719 Rosacea, unspecified: Secondary | ICD-10-CM

## 2017-07-17 DIAGNOSIS — I739 Peripheral vascular disease, unspecified: Secondary | ICD-10-CM | POA: Diagnosis not present

## 2017-07-17 DIAGNOSIS — I10 Essential (primary) hypertension: Secondary | ICD-10-CM

## 2017-07-17 DIAGNOSIS — Z125 Encounter for screening for malignant neoplasm of prostate: Secondary | ICD-10-CM | POA: Diagnosis not present

## 2017-07-17 MED ORDER — CILOSTAZOL 100 MG PO TABS: ORAL_TABLET | ORAL | 12 refills | Status: DC

## 2017-07-17 MED ORDER — HYDROCODONE-ACETAMINOPHEN 7.5-325 MG PO TABS: ORAL_TABLET | ORAL | 0 refills | Status: DC

## 2017-07-17 NOTE — Telephone Encounter (Signed)
Pt contacted office for refill request on the following medications:  traMADol (ULTRAM) 50 MG tablet  [facility]  Last Rx: 04/17/17 with 2 refills LOV: 07/17/17 Please advise. Thanks TNP

## 2017-07-17 NOTE — Progress Notes (Signed)
Patient: Dustin Guerra Male    DOB: 07-26-1955   62 y.o.   MRN: [id number] Visit Date: 07/17/2017  Today's Provider: Lelon Huh, MD   Chief Complaint  Patient presents with  . Follow-up  . Hypertension   Subjective:    HPI   Hypertension, follow-up:  BP Readings from Last 3 Encounters:  07/17/17 120/70  05/11/17 (!) 144/70  05/11/17 (!) 148/99    He was last seen for hypertension 2 months ago.  BP at that visit was 144/70. Management since that visit includes; added HCTZ 12.5 mg qd due to uncontrolled HTN. Advised to recheck Bp in 2 months.He reports good compliance with treatment. He is not having side effects. none He is exercising. He is not adherent to low salt diet.   Outside blood pressures are not checking. He is experiencing none.  Patient denies none.   Cardiovascular risk factors include advanced age (older than 77 for men, 69 for women).  Use of agents associated with hypertension: none ----------------------------------------------------------------  Follow up Rosacea Recently received prior authorization for Finacea which was prescribed years ago. He states he rarely requires this medication anymore. He thinks he tried metronidazole in the past but doesn't remember how well it worked. He is willing to try metronidazole again.   No Known Allergies   Current Outpatient Medications:  .  aspirin 81 MG tablet, Take by mouth., Disp: , Rfl:  .  Azelaic Acid (FINACEA) 15 % cream, After skin is thoroughly washed and patted dry, gently but thoroughly massage a thin film of azelaic acid cream into the affected area twice daily, in the morning and evening., Disp: 50 g, Rfl: 5 .  cilostazol (PLETAL) 100 MG tablet, TAKE ONE TABLET BY MOUTH TWO TIMES A DAY AS NEEDED FOR CIRCULATION, Disp: 60 tablet, Rfl: 12 .  diazepam (VALIUM) 10 MG tablet, TAKE ONE TABLET BY MOUTH DAILY AS NEEDED FOR ANXIETY, Disp: 30 tablet, Rfl: 4 .  docusate sodium (COLACE) 100 MG  capsule, Take 100 mg by mouth 2 (two) times daily., Disp: , Rfl:  .  EPINEPHrine (EPIPEN 2-PAK) 0.3 mg/0.3 mL IJ SOAJ injection, as needed. , Disp: , Rfl:  .  FINACEA 15 % FOAM, , Disp: , Rfl:  .  gabapentin (NEURONTIN) 600 MG tablet, TAKE ONE TABLET BY MOUTH THREE TIMES A DAY, Disp: 90 tablet, Rfl: 4 .  hydrochlorothiazide (HYDRODIURIL) 12.5 MG tablet, Take 1 tablet (12.5 mg total) by mouth daily., Disp: 90 tablet, Rfl: 1 .  HYDROcodone-acetaminophen (NORCO) 7.5-325 MG tablet, 1/2-1 tablet up to four times a day as needed, Disp: 120 tablet, Rfl: 0 .  indomethacin (INDOCIN) 50 MG capsule, TAKE ONE CAPSULE BY MOUTH TWICE A DAY WITH MEALS AS NEEDED FOR PAIN DO NOT TAKE IN ADDITION TO MELOXICAM, Disp: 60 capsule, Rfl: 3 .  IRON PO, Take by mouth., Disp: , Rfl:  .  lansoprazole (PREVACID) 15 MG capsule, Take 15 mg by mouth daily at 12 noon., Disp: , Rfl:  .  Multiple Vitamins-Minerals (CENTRUM SILVER) CHEW, Chew by mouth., Disp: , Rfl:  .  sildenafil (VIAGRA) 100 MG tablet, TAKE 0.5-1 TABLET BY MOUTH DAILY AS NEEDED FOR ERECTILE DYSFUNCTION, Disp: 5 tablet, Rfl: 8 .  telmisartan (MICARDIS) 40 MG tablet, Take 1 tablet (40 mg total) by mouth daily., Disp: 30 tablet, Rfl: 2 .  terbinafine (LAMISIL) 250 MG tablet, TAKE TWO TABLETS BY MOUTH DAILY FOR 7 DAYS EACH MONTH, Disp: 14 tablet, Rfl: 5 .  traMADol (ULTRAM) 50 MG tablet, TAKE ONE TABLET BY MOUTH THREE TIMES A DAY AS NEEDED, Disp: 90 tablet, Rfl: 2  Review of Systems  Constitutional: Negative for appetite change, chills and fever.  Respiratory: Negative for chest tightness, shortness of breath and wheezing.   Cardiovascular: Negative for chest pain and palpitations.  Gastrointestinal: Negative for abdominal pain, nausea and vomiting.    Social History   Tobacco Use  . Smoking status: Former Research scientist (life sciences)  . Smokeless tobacco: Never Used  . Tobacco comment: quit 2000, smoked about 1.5 packs for about 5 years  Substance Use Topics  . Alcohol use: No     Objective:   BP 120/70 (BP Location: Right Arm, Patient Position: Sitting, Cuff Size: Large)   Pulse 67   Temp 97.6 F (36.4 C) (Oral)   Resp 16   Ht 6\' 2"  (1.88 m)   Wt 230 lb (104.3 kg)   SpO2 96%   BMI 29.53 kg/m  Vitals:   07/17/17 0922  BP: 120/70  Pulse: 67  Resp: 16  Temp: 97.6 F (36.4 C)  TempSrc: Oral  SpO2: 96%  Weight: 230 lb (104.3 kg)  Height: 6\' 2"  (1.88 m)     Physical Exam   General Appearance:    Alert, cooperative, no distress  Eyes:    PERRL, conjunctiva/corneas clear, EOM's intact       Lungs:     Clear to auscultation bilaterally, respirations unlabored  Heart:    Regular rate and rhythm  Neurologic:   Awake, alert, oriented x 3. No apparent focal neurological           defect.           Assessment & Plan:     1. Neuropathy refill - HYDROcodone-acetaminophen (NORCO) 7.5-325 MG tablet; 1/2-1 tablet up to four times a day as needed  Dispense: 120 tablet; Refill: 0  2. PVD (peripheral vascular disease) (HCC) refill - cilostazol (PLETAL) 100 MG tablet; TAKE ONE TABLET BY MOUTH TWO TIMES A DAY AS NEEDED FOR CIRCULATION  Dispense: 60 tablet; Refill: 12  3. Prostate cancer screening  - PSA  4. Essential hypertension Well controlled.  Continue current medications.   - Comprehensive metabolic panel - Lipid panel  5. Rosacea Insurance requires prior auth for finacea. He rarely requires anymore and is willing to try metronidazole instead       Lelon Huh, MD  [place]

## 2017-07-17 NOTE — Telephone Encounter (Signed)
Please review. Thanks!  

## 2017-07-18 LAB — COMPREHENSIVE METABOLIC PANEL
ALK PHOS: 105 IU/L (ref 39–117)
ALT: 23 IU/L (ref 0–44)
AST: 25 IU/L (ref 0–40)
Albumin/Globulin Ratio: 2.2 (ref 1.2–2.2)
Albumin: 4.4 g/dL (ref 3.5–5.5)
BILIRUBIN TOTAL: 0.3 mg/dL (ref 0.0–1.2)
BUN/Creatinine Ratio: 22 — ABNORMAL HIGH (ref 9–20)
BUN: 22 mg/dL (ref 6–24)
CHLORIDE: 105 mmol/L (ref 96–106)
CO2: 24 mmol/L (ref 20–29)
CREATININE: 1.01 mg/dL (ref 0.76–1.27)
Calcium: 9.4 mg/dL (ref 8.7–10.2)
GFR calc Af Amer: 94 mL/min/{1.73_m2} (ref >59)
GFR calc non Af Amer: 81 mL/min/{1.73_m2} (ref >59)
GLUCOSE: 108 mg/dL — AB (ref 65–99)
Globulin, Total: 2 g/dL (ref 1.5–4.5)
Potassium: 4.3 mmol/L (ref 3.5–5.2)
Sodium: 142 mmol/L (ref 134–144)
Total Protein: 6.4 g/dL (ref 6.0–8.5)

## 2017-07-18 LAB — PSA: Prostate Specific Ag, Serum: 0.5 ng/mL (ref 0.0–4.0)

## 2017-07-18 LAB — LIPID PANEL
CHOLESTEROL TOTAL: 139 mg/dL (ref 100–199)
Chol/HDL Ratio: 4.1 ratio (ref 0.0–5.0)
HDL: 34 mg/dL — AB (ref >39)
LDL Calculated: 94 mg/dL (ref 0–99)
Triglycerides: 56 mg/dL (ref 0–149)
VLDL CHOLESTEROL CAL: 11 mg/dL (ref 5–40)

## 2017-07-20 ENCOUNTER — Telehealth: Payer: Self-pay

## 2017-07-20 MED ORDER — PRAVASTATIN SODIUM 40 MG PO TABS: 40.0000 mg | ORAL_TABLET | Freq: Every day | ORAL | 5 refills | Status: DC

## 2017-07-20 NOTE — Telephone Encounter (Signed)
-----   Message from Birdie Sons, MD sent at 07/20/2017  8:18 AM EDT ----- LDL cholesterol is 94, should be under 70 since he has diabetes. Start pravastatin 40mg  once a day, #30, rf x 5. Normal PSA. Schedule follow up to check cholesterol and a1c in 3-4 months.

## 2017-07-20 NOTE — Telephone Encounter (Signed)
LMTCB 07/20/2017  Thanks,   -Jaymir Struble  

## 2017-07-20 NOTE — Telephone Encounter (Signed)
Pt advised.  RX sent to [facility].  Pt says he will call back and schedule follow up later.   Thanks,   -Mickel Baas

## 2017-07-28 ENCOUNTER — Ambulatory Visit (INDEPENDENT_AMBULATORY_CARE_PROVIDER_SITE_OTHER): Payer: Commercial Managed Care - PPO | Admitting: Vascular Surgery

## 2017-07-28 ENCOUNTER — Encounter (INDEPENDENT_AMBULATORY_CARE_PROVIDER_SITE_OTHER): Payer: Commercial Managed Care - PPO

## 2017-07-31 ENCOUNTER — Other Ambulatory Visit: Payer: Self-pay | Admitting: Family Medicine

## 2017-08-01 DIAGNOSIS — J028 Acute pharyngitis due to other specified organisms: Secondary | ICD-10-CM | POA: Diagnosis not present

## 2017-08-03 ENCOUNTER — Encounter: Payer: Self-pay | Admitting: Family Medicine

## 2017-08-03 ENCOUNTER — Ambulatory Visit: Payer: Commercial Managed Care - PPO | Admitting: Family Medicine

## 2017-08-03 VITALS — BP 94/62 | HR 80 | Temp 98.2°F | Resp 18 | Wt 225.0 lb

## 2017-08-03 DIAGNOSIS — J40 Bronchitis, not specified as acute or chronic: Secondary | ICD-10-CM

## 2017-08-03 MED ORDER — AZITHROMYCIN 250 MG PO TABS: ORAL_TABLET | ORAL | 0 refills | Status: AC

## 2017-08-03 NOTE — Progress Notes (Signed)
Patient: Dustin Guerra Male    DOB: Aug 17, 1955   62 y.o.   MRN: [id number] Visit Date: 08/03/2017  Today's Provider: Lelon Huh, MD   Chief Complaint  Patient presents with  . URI    x 3 days   Subjective:    URI   This is a new problem. Episode onset: 3 days ago. The problem has been gradually worsening. Maximum temperature: low grade 99. Associated symptoms include congestion, coughing (productive with yellow sputum ), headaches, neck pain, a plugged ear sensation, a sore throat, swollen glands and wheezing. Pertinent negatives include no chest pain, ear pain, nausea, rhinorrhea, sinus pain or sneezing. Associated symptoms comments: Also ear pressure. Treatments tried: took 4 tablet of Amoxicill that was left over from a past infection. The treatment provided mild relief.      No Known Allergies   Current Outpatient Medications:  .  aspirin 81 MG tablet, Take by mouth., Disp: , Rfl:  .  Azelaic Acid (FINACEA) 15 % cream, After skin is thoroughly washed and patted dry, gently but thoroughly massage a thin film of azelaic acid cream into the affected area twice daily, in the morning and evening., Disp: 50 g, Rfl: 5 .  cilostazol (PLETAL) 100 MG tablet, TAKE ONE TABLET BY MOUTH TWO TIMES A DAY AS NEEDED FOR CIRCULATION, Disp: 60 tablet, Rfl: 12 .  diazepam (VALIUM) 10 MG tablet, TAKE ONE TABLET BY MOUTH DAILY AS NEEDED FOR ANXIETY, Disp: 30 tablet, Rfl: 4 .  docusate sodium (COLACE) 100 MG capsule, Take 100 mg by mouth 2 (two) times daily., Disp: , Rfl:  .  EPINEPHrine (EPIPEN 2-PAK) 0.3 mg/0.3 mL IJ SOAJ injection, as needed. , Disp: , Rfl:  .  gabapentin (NEURONTIN) 600 MG tablet, TAKE ONE TABLET BY MOUTH THREE TIMES A DAY, Disp: 90 tablet, Rfl: 4 .  hydrochlorothiazide (HYDRODIURIL) 12.5 MG tablet, Take 1 tablet (12.5 mg total) by mouth daily., Disp: 90 tablet, Rfl: 1 .  HYDROcodone-acetaminophen (NORCO) 7.5-325 MG tablet, 1/2-1 tablet up to four times a day as  needed, Disp: 120 tablet, Rfl: 0 .  indomethacin (INDOCIN) 50 MG capsule, TAKE ONE CAPSULE BY MOUTH TWICE A DAY WITH MEALS AS NEEDED FOR PAIN DO NOT TAKE IN ADDITION TO MELOXICAM, Disp: 60 capsule, Rfl: 3 .  IRON PO, Take by mouth., Disp: , Rfl:  .  lansoprazole (PREVACID) 15 MG capsule, Take 15 mg by mouth daily at 12 noon., Disp: , Rfl:  .  Multiple Vitamins-Minerals (CENTRUM SILVER) CHEW, Chew by mouth., Disp: , Rfl:  .  pravastatin (PRAVACHOL) 40 MG tablet, Take 1 tablet (40 mg total) by mouth daily., Disp: 30 tablet, Rfl: 5 .  sildenafil (VIAGRA) 100 MG tablet, TAKE 0.5-1 TABLET BY MOUTH DAILY AS NEEDED FOR ERECTILE DYSFUNCTION, Disp: 5 tablet, Rfl: 8 .  telmisartan (MICARDIS) 40 MG tablet, Take 1 tablet (40 mg total) by mouth daily., Disp: 30 tablet, Rfl: 2 .  terbinafine (LAMISIL) 250 MG tablet, TAKE TWO TABLETS BY MOUTH DAILY FOR 7 DAYS EACH MONTH, Disp: 14 tablet, Rfl: 4 .  traMADol (ULTRAM) 50 MG tablet, TAKE ONE TABLET BY MOUTH THREE TIMES A DAY AS NEEDED, Disp: 90 tablet, Rfl: 4  Review of Systems  Constitutional: Positive for chills, diaphoresis and fatigue. Negative for appetite change and fever.  HENT: Positive for congestion and sore throat. Negative for ear pain, rhinorrhea, sinus pressure, sinus pain and sneezing.   Respiratory: Positive for cough (productive with yellow sputum )  and wheezing. Negative for chest tightness and shortness of breath.   Cardiovascular: Negative for chest pain and palpitations.  Gastrointestinal: Negative for nausea.  Musculoskeletal: Positive for neck pain.  Neurological: Positive for headaches.    Social History   Tobacco Use  . Smoking status: Former Research scientist (life sciences)  . Smokeless tobacco: Never Used  . Tobacco comment: quit 2000, smoked about 1.5 packs for about 5 years  Substance Use Topics  . Alcohol use: No   Objective:   BP 94/62 (BP Location: Left Arm, Patient Position: Sitting, Cuff Size: Large)   Pulse 80   Temp 98.2 F (36.8 C) (Oral)    Resp 18   Wt 225 lb (102.1 kg)   SpO2 98% Comment: room air  BMI 28.89 kg/m     Physical Exam  General Appearance:    Alert, cooperative, no distress  HENT:   bilateral TM normal without fluid or infection, neck without nodes, throat normal without erythema or exudate, right sinus tender and nasal mucosa pale and congested  Eyes:    PERRL, conjunctiva/corneas clear, EOM's intact       Lungs:     Occasional expiratory wheeze, no rales, , respirations unlabored  Heart:    Regular rate and rhythm  Neurologic:   Awake, alert, oriented x 3. No apparent focal neurological           defect.           Assessment & Plan:     1. Bronchitis  - azithromycin (ZITHROMAX) 250 MG tablet; 2 by mouth today, then 1 daily for 4 days  Dispense: 6 tablet; Refill: 0  Call if symptoms change or if not rapidly improving.        Lelon Huh, MD  [facility]

## 2017-08-17 ENCOUNTER — Other Ambulatory Visit: Payer: Self-pay | Admitting: Family Medicine

## 2017-08-17 DIAGNOSIS — G629 Polyneuropathy, unspecified: Secondary | ICD-10-CM

## 2017-08-17 NOTE — Telephone Encounter (Signed)
Patient needs refill of Hydrocodone 7.5 mg -325 mg. sent to [facility]

## 2017-08-18 MED ORDER — HYDROCODONE-ACETAMINOPHEN 7.5-325 MG PO TABS: ORAL_TABLET | ORAL | 0 refills | Status: DC

## 2017-08-24 ENCOUNTER — Ambulatory Visit (INDEPENDENT_AMBULATORY_CARE_PROVIDER_SITE_OTHER): Payer: Commercial Managed Care - PPO | Admitting: Family Medicine

## 2017-08-24 ENCOUNTER — Encounter: Payer: Self-pay | Admitting: Family Medicine

## 2017-08-24 VITALS — BP 100/48 | HR 93 | Temp 98.0°F | Resp 16 | Ht 74.0 in | Wt 232.0 lb

## 2017-08-24 DIAGNOSIS — E785 Hyperlipidemia, unspecified: Secondary | ICD-10-CM | POA: Diagnosis not present

## 2017-08-24 DIAGNOSIS — Z0000 Encounter for general adult medical examination without abnormal findings: Secondary | ICD-10-CM

## 2017-08-24 DIAGNOSIS — R490 Dysphonia: Secondary | ICD-10-CM

## 2017-08-24 DIAGNOSIS — I10 Essential (primary) hypertension: Secondary | ICD-10-CM

## 2017-08-24 DIAGNOSIS — Z1159 Encounter for screening for other viral diseases: Secondary | ICD-10-CM

## 2017-08-24 NOTE — Progress Notes (Signed)
Patient: Dustin Guerra, Male    DOB: [birth year], 62 y.o.   MRN: [id number] Visit Date: 08/24/2017  Today's Provider: Lelon Huh, MD   Chief Complaint  Patient presents with  . Annual Exam   Subjective:    Annual physical exam Dustin Guerra is a 62 y.o. male who presents today for health maintenance and complete physical. He feels well. He reports exercising yes. He reports he is sleeping fairly well.   He Is also here to follow up since starting pravastatin in May. Is tolerating well with no adverse effects.   He is also concerned about voice always been hoarse, which has worsened over the last several months. No throat pain. Denies heartburn Is taking lansoprazole consistently. No sinus drainage.  ----------------------------------------------------------------   Review of Systems  Constitutional: Negative for chills, diaphoresis and fever.  HENT: Positive for dental problem. Negative for congestion, ear discharge, ear pain, hearing loss, nosebleeds, sore throat and tinnitus.   Eyes: Negative for photophobia, pain, discharge and redness.  Respiratory: Negative for cough, shortness of breath, wheezing and stridor.   Cardiovascular: Negative for chest pain, palpitations and leg swelling.  Gastrointestinal: Negative for abdominal pain, blood in stool, constipation, diarrhea, nausea and vomiting.  Endocrine: Negative for polydipsia.  Genitourinary: Negative for dysuria, flank pain, frequency, hematuria and urgency.  Musculoskeletal: Negative for back pain, myalgias and neck pain.  Skin: Negative for rash.  Allergic/Immunologic: Negative for environmental allergies.  Neurological: Negative for dizziness, tremors, seizures, weakness and headaches.  Hematological: Does not bruise/bleed easily.  Psychiatric/Behavioral: Negative for hallucinations and suicidal ideas. The patient is not nervous/anxious.     Social History      He  reports that he has quit  smoking. He has never used smokeless tobacco. He reports that he does not drink alcohol or use drugs.       Social History   Socioeconomic History  . Marital status: Single    Spouse name: Not on file  . Number of children: Not on file  . Years of education: Not on file  . Highest education level: Not on file  Occupational History  . Not on file  Social Needs  . Financial resource strain: Not on file  . Food insecurity:    Worry: Not on file    Inability: Not on file  . Transportation needs:    Medical: Not on file    Non-medical: Not on file  Tobacco Use  . Smoking status: Former Research scientist (life sciences)  . Smokeless tobacco: Never Used  . Tobacco comment: quit 2000, smoked about 1.5 packs for about 5 years  Substance and Sexual Activity  . Alcohol use: No  . Drug use: No  . Sexual activity: Not on file  Lifestyle  . Physical activity:    Days per week: Not on file    Minutes per session: Not on file  . Stress: Not on file  Relationships  . Social connections:    Talks on phone: Not on file    Gets together: Not on file    Attends religious service: Not on file    Active member of club or organization: Not on file    Attends meetings of clubs or organizations: Not on file    Relationship status: Not on file  Other Topics Concern  . Not on file  Social History Narrative  . Not on file    Past Medical History:  Diagnosis Date  . Anxiety   .  Hypertension      Patient Active Problem List   Diagnosis Date Noted  . Microalbuminuria 11/21/2016  . Venous ulcer of ankle, left ([place]) 09/24/2016  . Venous insufficiency of both lower extremities 08/27/2016  . Lymphedema 08/27/2016  . Bilateral lower extremity edema 08/13/2016  . Varicose veins of both lower extremities 07/23/2016  . Arthralgia of both hands 06/09/2016  . PVD (peripheral vascular disease) ([place]) 12/07/2015  . Neuropathy 08/04/2015  . Plantar fasciitis 08/04/2015  . H/O alcohol abuse 08/01/2015  . Allergic to bees  08/01/2015  . Anxiety 08/01/2015  . Pre-diabetes 08/01/2015  . Clinical depression 08/01/2015  . Abnormal liver enzymes 08/01/2015  . Acid reflux 08/01/2015  . BP (high blood pressure) 08/01/2015  . Episodic paroxysmal anxiety disorder 08/01/2015  . Rosacea 08/01/2015    Past Surgical History:  Procedure Laterality Date  . BACK SURGERY    . COLON SURGERY  2015   Colon resection due to diverticulosis  . VEIN LIGATION AND STRIPPING    . VEIN SURGERY     Vein stripping at age 86-25    Family History        Family Status  Relation Name Status  . Mother  Deceased at age 59  . Father  Deceased at age 36  . Brother  Deceased at age 26        His family history includes Alcohol abuse in his mother; Esophageal cancer in his father; Heart failure in his brother.      No Known Allergies   Current Outpatient Medications:  .  aspirin 81 MG tablet, Take by mouth., Disp: , Rfl:  .  Azelaic Acid (FINACEA) 15 % cream, After skin is thoroughly washed and patted dry, gently but thoroughly massage a thin film of azelaic acid cream into the affected area twice daily, in the morning and evening., Disp: 50 g, Rfl: 5 .  cilostazol (PLETAL) 100 MG tablet, TAKE ONE TABLET BY MOUTH TWO TIMES A DAY AS NEEDED FOR CIRCULATION, Disp: 60 tablet, Rfl: 12 .  diazepam (VALIUM) 10 MG tablet, TAKE ONE TABLET BY MOUTH DAILY AS NEEDED FOR ANXIETY, Disp: 30 tablet, Rfl: 4 .  docusate sodium (COLACE) 100 MG capsule, Take 100 mg by mouth 2 (two) times daily., Disp: , Rfl:  .  EPINEPHrine (EPIPEN 2-PAK) 0.3 mg/0.3 mL IJ SOAJ injection, as needed. , Disp: , Rfl:  .  gabapentin (NEURONTIN) 600 MG tablet, TAKE ONE TABLET BY MOUTH THREE TIMES A DAY, Disp: 90 tablet, Rfl: 4 .  hydrochlorothiazide (HYDRODIURIL) 12.5 MG tablet, Take 1 tablet (12.5 mg total) by mouth daily., Disp: 90 tablet, Rfl: 1 .  HYDROcodone-acetaminophen (NORCO) 7.5-325 MG tablet, 1/2-1 tablet up to four times a day as needed, Disp: 120 tablet, Rfl:  0 .  indomethacin (INDOCIN) 50 MG capsule, TAKE ONE CAPSULE BY MOUTH TWICE A DAY WITH MEALS AS NEEDED FOR PAIN DO NOT TAKE IN ADDITION TO MELOXICAM, Disp: 60 capsule, Rfl: 3 .  IRON PO, Take by mouth., Disp: , Rfl:  .  lansoprazole (PREVACID) 15 MG capsule, Take 15 mg by mouth daily at 12 noon., Disp: , Rfl:  .  Multiple Vitamins-Minerals (CENTRUM SILVER) CHEW, Chew by mouth., Disp: , Rfl:  .  pravastatin (PRAVACHOL) 40 MG tablet, Take 1 tablet (40 mg total) by mouth daily., Disp: 30 tablet, Rfl: 5 .  sildenafil (VIAGRA) 100 MG tablet, TAKE 0.5-1 TABLET BY MOUTH DAILY AS NEEDED FOR ERECTILE DYSFUNCTION, Disp: 5 tablet, Rfl: 8 .  telmisartan (  MICARDIS) 40 MG tablet, Take 1 tablet (40 mg total) by mouth daily., Disp: 30 tablet, Rfl: 2 .  terbinafine (LAMISIL) 250 MG tablet, TAKE TWO TABLETS BY MOUTH DAILY FOR 7 DAYS EACH MONTH, Disp: 14 tablet, Rfl: 4 .  traMADol (ULTRAM) 50 MG tablet, TAKE ONE TABLET BY MOUTH THREE TIMES A DAY AS NEEDED, Disp: 90 tablet, Rfl: 4   Patient Care Team: Birdie Sons, MD as PCP - General (Family Medicine)      Objective:   Vitals: BP (!) 100/48 (BP Location: Right Arm, Patient Position: Sitting, Cuff Size: Large)   Pulse 93   Temp 98 F (36.7 C) (Oral)   Resp 16   Ht 6\' 2"  (1.88 m)   Wt 232 lb (105.2 kg)   SpO2 96%   BMI 29.79 kg/m    Vitals:   08/24/17 1414  BP: (!) 100/48  Pulse: 93  Resp: 16  Temp: 98 F (36.7 C)  TempSrc: Oral  SpO2: 96%  Weight: 232 lb (105.2 kg)  Height: 6\' 2"  (1.88 m)     Physical Exam   General Appearance:    Alert, cooperative, no distress, appears stated age  Head:    Normocephalic, without obvious abnormality, atraumatic  Eyes:    PERRL, conjunctiva/corneas clear, EOM's intact, fundi    benign, both eyes       Ears:    Normal TM's and external ear canals, both ears  Nose:   Nares normal, septum midline, mucosa normal, no drainage   or sinus tenderness  Throat:   Lips, mucosa, and tongue normal; teeth and  gums normal  Neck:   Supple, symmetrical, trachea midline, no adenopathy;       thyroid:  No enlargement/tenderness/nodules; no carotid   bruit or JVD  Back:     Symmetric, no curvature, ROM normal, no CVA tenderness  Lungs:     Clear to auscultation bilaterally, respirations unlabored  Chest wall:    No tenderness or deformity  Heart:    Regular rate and rhythm, S1 and S2 normal, no murmur, rub   or gallop  Abdomen:     Soft, non-tender, bowel sounds active all four quadrants,    no masses, no organomegaly  Genitalia:    deferred  Rectal:    deferred  Extremities:   Extremities normal, atraumatic, no cyanosis or edema  Pulses:   2+ and symmetric all extremities  Skin:   Skin color, texture, turgor normal, no rashes or lesions  Lymph nodes:   Cervical, supraclavicular, and axillary nodes normal  Neurologic:   CNII-XII intact. Normal strength, sensation and reflexes      throughout   Depression Screen PHQ 2/9 Scores 08/24/2017 07/17/2017 06/06/2016  PHQ - 2 Score 0 0 0  PHQ- 9 Score 0 1 1      Assessment & Plan:     Routine Health Maintenance and Physical Exam  Exercise Activities and Dietary recommendations Goals    None      Immunization History  Administered Date(s) Administered  . Tdap 08/27/2016    Health Maintenance  Topic Date Due  . Hepatitis C Screening  04-Sep-1955  . PNEUMOCOCCAL POLYSACCHARIDE VACCINE (1) 09/09/1959  . FOOT EXAM  09/09/1967  . OPHTHALMOLOGY EXAM  09/09/1967  . HIV Screening  09/08/1972  . INFLUENZA VACCINE  10/15/2017  . HEMOGLOBIN A1C  11/08/2017  . COLONOSCOPY  01/16/2025  . TETANUS/TDAP  08/28/2026     Discussed health benefits of physical activity, and encouraged him  to engage in regular exercise appropriate for his age and condition.    --------------------------------------------------------------------  1. Annual physical exam  The CDC recommends two doses of Shingrix (the shingles vaccine) separated by 2 to 6 months for  adults age 92 years and older. I recommend checking with your insurance plan regarding coverage for this vaccine.    2. Chronic hoarseness  - Ambulatory referral to ENT  3. Need for hepatitis C screening test  - Hepatitis C antibody  4. Hyperlipidemia, unspecified hyperlipidemia type He is tolerating pravastatin well with no adverse effects.   - Comprehensive metabolic panel - Lipid panel  5. Essential hypertension Well controlled.  Continue current medications.   - EKG 12-Lead   Lelon Huh, MD  [facility]

## 2017-08-24 NOTE — Patient Instructions (Addendum)
   Please contact your eyecare professional to schedule a routine eye exam   The CDC recommends two doses of Shingrix (the shingles vaccine) separated by 2 to 6 months for adults age 60 years and older. I recommend checking with your insurance plan regarding coverage for this vaccine.   

## 2017-08-25 ENCOUNTER — Telehealth: Payer: Self-pay | Admitting: *Deleted

## 2017-08-25 LAB — COMPREHENSIVE METABOLIC PANEL
ALT: 18 IU/L (ref 0–44)
AST: 19 IU/L (ref 0–40)
Albumin/Globulin Ratio: 1.9 (ref 1.2–2.2)
Albumin: 4.4 g/dL (ref 3.5–5.5)
Alkaline Phosphatase: 96 IU/L (ref 39–117)
BUN/Creatinine Ratio: 14 (ref 9–20)
BUN: 18 mg/dL (ref 6–24)
Bilirubin Total: 0.5 mg/dL (ref 0.0–1.2)
CALCIUM: 9.5 mg/dL (ref 8.7–10.2)
CO2: 25 mmol/L (ref 20–29)
CREATININE: 1.25 mg/dL (ref 0.76–1.27)
Chloride: 100 mmol/L (ref 96–106)
GFR calc Af Amer: 72 mL/min/{1.73_m2} (ref >59)
GFR, EST NON AFRICAN AMERICAN: 63 mL/min/{1.73_m2} (ref >59)
GLOBULIN, TOTAL: 2.3 g/dL (ref 1.5–4.5)
GLUCOSE: 101 mg/dL — AB (ref 65–99)
Potassium: 4.5 mmol/L (ref 3.5–5.2)
Sodium: 137 mmol/L (ref 134–144)
Total Protein: 6.7 g/dL (ref 6.0–8.5)

## 2017-08-25 LAB — LIPID PANEL
CHOL/HDL RATIO: 3.5 ratio (ref 0.0–5.0)
CHOLESTEROL TOTAL: 114 mg/dL (ref 100–199)
HDL: 33 mg/dL — AB (ref >39)
LDL CALC: 62 mg/dL (ref 0–99)
TRIGLYCERIDES: 96 mg/dL (ref 0–149)
VLDL CHOLESTEROL CAL: 19 mg/dL (ref 5–40)

## 2017-08-25 LAB — HEPATITIS C ANTIBODY: Hep C Virus Ab: <0.1 s/co ratio (ref 0.0–0.9)

## 2017-08-25 NOTE — Telephone Encounter (Signed)
Patient was notified of results. Expressed understanding.  

## 2017-08-25 NOTE — Telephone Encounter (Signed)
-----   Message from Birdie Sons, MD sent at 08/25/2017  8:32 AM EDT ----- Blood sugar, kidney functions, electrolytes and cholesterol are all normal. Hep C test is negative. Continue current medications.  Check labs yearly.

## 2017-08-25 NOTE — Telephone Encounter (Signed)
LMOVM for pt to return call 

## 2017-08-29 ENCOUNTER — Other Ambulatory Visit: Payer: Self-pay | Admitting: Family Medicine

## 2017-09-01 ENCOUNTER — Encounter: Payer: Self-pay | Admitting: Family Medicine

## 2017-09-01 DIAGNOSIS — K5730 Diverticulosis of large intestine without perforation or abscess without bleeding: Secondary | ICD-10-CM | POA: Insufficient documentation

## 2017-09-21 ENCOUNTER — Other Ambulatory Visit: Payer: Self-pay | Admitting: Family Medicine

## 2017-09-21 DIAGNOSIS — G629 Polyneuropathy, unspecified: Secondary | ICD-10-CM

## 2017-09-21 MED ORDER — HYDROCODONE-ACETAMINOPHEN 7.5-325 MG PO TABS: ORAL_TABLET | ORAL | 0 refills | Status: DC

## 2017-09-21 NOTE — Telephone Encounter (Signed)
pt needs refill on his hydrocodone  Thanks teri

## 2017-09-28 ENCOUNTER — Other Ambulatory Visit: Payer: Self-pay | Admitting: Family Medicine

## 2017-10-01 DIAGNOSIS — K219 Gastro-esophageal reflux disease without esophagitis: Secondary | ICD-10-CM | POA: Diagnosis not present

## 2017-10-01 DIAGNOSIS — R490 Dysphonia: Secondary | ICD-10-CM | POA: Diagnosis not present

## 2017-10-01 NOTE — Progress Notes (Signed)
Patient: Dustin Guerra Male    DOB: [birth year]   62 y.o.   MRN: [id number] Visit Date: 10/02/2017  Today's Provider: Lelon Huh, MD   Chief Complaint  Patient presents with  . Foot Pain   Subjective:    HPI Foot pain: Patient comes in today complaining of pain in both feet. He says the pain is worse in his left and is accompanied by swelling and redness. Patient states this is a chronic neuropathy pain, that has worsened in the past 4 weeks. Has no recent injuries  Patient has tried applying ice packs to his feet with no relief. He states the pain is affecting his gait and causing sciatica to flare up.     No Known Allergies   Current Outpatient Medications:  .  aspirin 81 MG tablet, Take by mouth., Disp: , Rfl:  .  Azelaic Acid (FINACEA) 15 % cream, After skin is thoroughly washed and patted dry, gently but thoroughly massage a thin film of azelaic acid cream into the affected area twice daily, in the morning and evening., Disp: 50 g, Rfl: 5 .  cilostazol (PLETAL) 100 MG tablet, TAKE ONE TABLET BY MOUTH TWO TIMES A DAY AS NEEDED FOR CIRCULATION, Disp: 60 tablet, Rfl: 12 .  diazepam (VALIUM) 10 MG tablet, TAKE ONE TABLET BY MOUTH DAILY AS NEEDED FOR ANXIETY, Disp: 30 tablet, Rfl: 4 .  docusate sodium (COLACE) 100 MG capsule, Take 100 mg by mouth 2 (two) times daily., Disp: , Rfl:  .  EPINEPHrine (EPIPEN 2-PAK) 0.3 mg/0.3 mL IJ SOAJ injection, as needed. , Disp: , Rfl:  .  gabapentin (NEURONTIN) 600 MG tablet, TAKE ONE TABLET BY MOUTH THREE TIMES A DAY, Disp: 90 tablet, Rfl: 4 .  HYDROcodone-acetaminophen (NORCO) 7.5-325 MG tablet, 1/2-1 tablet up to four times a day as needed, Disp: 120 tablet, Rfl: 0 .  indomethacin (INDOCIN) 50 MG capsule, TAKE ONE CAPSULE BY MOUTH TWICE A DAY WITH MEALS AS NEEDED FOR PAIN DO NOT TAKE IN ADDITION TO MELOXICAM, Disp: 60 capsule, Rfl: 3 .  IRON PO, Take by mouth., Disp: , Rfl:  .  Multiple Vitamins-Minerals (CENTRUM SILVER) CHEW,  Chew by mouth., Disp: , Rfl:  .  omeprazole (PRILOSEC) 40 MG capsule, Take 1 capsule by mouth daily., Disp: , Rfl:  .  pravastatin (PRAVACHOL) 40 MG tablet, Take 1 tablet (40 mg total) by mouth daily., Disp: 30 tablet, Rfl: 5 .  sildenafil (VIAGRA) 100 MG tablet, TAKE 0.5-1 TABLET BY MOUTH DAILY AS NEEDED FOR ERECTILE DYSFUNCTION, Disp: 5 tablet, Rfl: 8 .  telmisartan (MICARDIS) 40 MG tablet, TAKE ONE TABLET BY MOUTH DAILY THIS REPLACES IRBESARTAN, Disp: 30 tablet, Rfl: 5 .  terbinafine (LAMISIL) 250 MG tablet, TAKE TWO TABLETS BY MOUTH DAILY FOR 7 DAYS EACH MONTH, Disp: 14 tablet, Rfl: 4 .  traMADol (ULTRAM) 50 MG tablet, TAKE ONE TABLET BY MOUTH THREE TIMES A DAY AS NEEDED, Disp: 90 tablet, Rfl: 4  Review of Systems  Constitutional: Negative for appetite change, chills and fever.  Respiratory: Negative for chest tightness, shortness of breath and wheezing.   Cardiovascular: Negative for chest pain and palpitations.  Gastrointestinal: Negative for abdominal pain, nausea and vomiting.  Musculoskeletal: Positive for arthralgias (both feet) and joint swelling.  Skin: Positive for color change (redness in left foot).    Social History   Tobacco Use  . Smoking status: Former Research scientist (life sciences)  . Smokeless tobacco: Never Used  . Tobacco comment: quit 2000,  smoked about 1.5 packs for about 5 years  Substance Use Topics  . Alcohol use: No   Objective:   BP (!) 156/90 (BP Location: Left Arm, Patient Position: Sitting, Cuff Size: Large)   Pulse (!) 55   Temp 97.7 F (36.5 C) (Oral)   Resp 16   Wt 234 lb (106.1 kg)   SpO2 98% Comment: room air  BMI 30.04 kg/m  Vitals:   10/02/17 0832  BP: (!) 156/90  Pulse: (!) 55  Resp: 16  Temp: 97.7 F (36.5 C)  TempSrc: Oral  SpO2: 98%  Weight: 234 lb (106.1 kg)     Physical Exam   General Appearance:    Alert, cooperative, no distress  Eyes:    PERRL, conjunctiva/corneas clear, EOM's intact       Lungs:     Clear to auscultation bilaterally,  respirations unlabored  Heart:    Regular rate and rhythm  Neurologic:   Awake, alert, oriented x 3. No apparent focal neurological           defect.   Ext:   Diminished s/s of both feet. No focal tenderness. No erythema or swelling.       Assessment & Plan:     1. Foot pain, bilateral Likely due to neuropathy as below. Image to rule out other pathology.  - DG Foot Complete Left; Future   2. Neuropathy Change gabapentin to - pregabalin (LYRICA) 75 MG capsule; Take 1 capsule (75 mg total) by mouth 3 (three) times daily.  Dispense: 56 capsule; Refill: 0, anticipate titrating as needd.        Lelon Huh, MD  [facility]

## 2017-10-02 ENCOUNTER — Ambulatory Visit: Payer: Commercial Managed Care - PPO | Admitting: Family Medicine

## 2017-10-02 ENCOUNTER — Encounter: Payer: Self-pay | Admitting: Family Medicine

## 2017-10-02 ENCOUNTER — Ambulatory Visit
Admission: RE | Admit: 2017-10-02 | Discharge: 2017-10-02 | Disposition: A | Discharge status: Home / Self Care | Payer: Commercial Managed Care - PPO | Source: Ambulatory Visit | Attending: Family Medicine | Admitting: Family Medicine

## 2017-10-02 VITALS — BP 156/90 | HR 55 | Temp 97.7°F | Resp 16 | Wt 234.0 lb

## 2017-10-02 DIAGNOSIS — M79672 Pain in left foot: Secondary | ICD-10-CM

## 2017-10-02 DIAGNOSIS — M79671 Pain in right foot: Secondary | ICD-10-CM

## 2017-10-02 DIAGNOSIS — M19072 Primary osteoarthritis, left ankle and foot: Secondary | ICD-10-CM | POA: Insufficient documentation

## 2017-10-02 DIAGNOSIS — G629 Polyneuropathy, unspecified: Secondary | ICD-10-CM

## 2017-10-02 MED ORDER — PREGABALIN 75 MG PO CAPS: 75.0000 mg | ORAL_CAPSULE | Freq: Three times a day (TID) | ORAL | 0 refills | Status: DC

## 2017-10-02 NOTE — Patient Instructions (Signed)
   Go to the [facility] on [facility] for left foot Xray

## 2017-10-05 ENCOUNTER — Telehealth: Payer: Self-pay | Admitting: *Deleted

## 2017-10-05 DIAGNOSIS — M79672 Pain in left foot: Secondary | ICD-10-CM

## 2017-10-05 NOTE — Telephone Encounter (Signed)
-----   Message from Birdie Sons, MD sent at 10/05/2017  8:11 AM EDT ----- [place]in Guerra shows unusual amount of arthritis in fore foot. Start meloxicam 15mg  once a day, #30 rf x 0. Need referral to podiatry for further evaluation.

## 2017-10-05 NOTE — Telephone Encounter (Signed)
LMOVM for pt to return call 

## 2017-10-05 NOTE — Telephone Encounter (Signed)
No, take it in place of indomethacin.

## 2017-10-05 NOTE — Telephone Encounter (Signed)
Patient was advised. Referral for podiatry in epic. Patient wanted to no if it is safe to take the meloxicam with indomethacin? Please advise?

## 2017-10-06 MED ORDER — MELOXICAM 15 MG PO TABS: 15.0000 mg | ORAL_TABLET | Freq: Every day | ORAL | 0 refills | Status: DC

## 2017-10-06 NOTE — Telephone Encounter (Signed)
LMOVM for pt to return call 

## 2017-10-06 NOTE — Telephone Encounter (Signed)
Patient was advised. Expressed understanding.  

## 2017-10-06 NOTE — Telephone Encounter (Signed)
Pt returned call. Please advise. Thanks TNP °

## 2017-10-07 ENCOUNTER — Telehealth: Payer: Self-pay | Admitting: Family Medicine

## 2017-10-07 NOTE — Telephone Encounter (Signed)
He can increase samples or 75mg  to four times a day. When we get prescription we'll change it to 150mg  three time a day which should work well. (Insurance won't pay for anymore then 3 a day)

## 2017-10-07 NOTE — Telephone Encounter (Signed)
LMTCB

## 2017-10-07 NOTE — Telephone Encounter (Signed)
Pt is requesting call back to discuss pregabalin (LYRICA) 75 MG capsule. Pt stated that taking the medication 3 times a day isn't getting him through the day and is asking if he can take the medication more than 3 times a day. Please advise. Thanks TNP

## 2017-10-07 NOTE — Telephone Encounter (Signed)
Please advise 

## 2017-10-08 NOTE — Telephone Encounter (Signed)
Patient advised. He verbalized understanding.  

## 2017-10-14 ENCOUNTER — Other Ambulatory Visit: Payer: Self-pay | Admitting: Family Medicine

## 2017-10-14 ENCOUNTER — Telehealth: Payer: Self-pay

## 2017-10-14 DIAGNOSIS — G629 Polyneuropathy, unspecified: Secondary | ICD-10-CM

## 2017-10-14 NOTE — Telephone Encounter (Signed)
LMTCB

## 2017-10-14 NOTE — Telephone Encounter (Signed)
Pt called to say he is out of the lyrical 75 mg he takes 2 pills at one time but he says they are not lasting all day so he does not hink he needs to keep taking them.  He use to take gabapentin 3 times a day and that worked.  He wants to know what can he do  Pt's CB # [phone]  He cant answer his phone until 3:45  Thanks, [name]

## 2017-10-14 NOTE — Telephone Encounter (Signed)
Patient called office stating that he was returning  A call from Dr. Sabino Snipes nurse. KW

## 2017-10-14 NOTE — Telephone Encounter (Signed)
Patient advised can change Lyrica to 150 mg TID. Patient states he has been taking 150 mg TID since 10/07/17. He states the tingling in his feet returns after 2-3 hours after taking medication. He wanted to know if Lyrica could be increased to 225 mg or 300 mg since insurance will only pay for 3 capsules per day. Patient reports previously taking Gabapentin 600 mg TID with relief. He states he will try which ever medication Dr. Caryn Section recommends. Please advise.

## 2017-10-14 NOTE — Telephone Encounter (Signed)
Change to 150mg  capsules, one capsule three times daily, #90, rf x 2.

## 2017-10-16 MED ORDER — PREGABALIN 225 MG PO CAPS: 225.0000 mg | ORAL_CAPSULE | Freq: Three times a day (TID) | ORAL | 3 refills | Status: DC

## 2017-10-16 NOTE — Telephone Encounter (Signed)
Patient advised.

## 2017-10-16 NOTE — Telephone Encounter (Signed)
Pt called requesting a status update on his request about Lyrica. Please advise. Thanks TNP

## 2017-10-16 NOTE — Telephone Encounter (Signed)
Have sent prescription for 225mg  TID to his pharmacy.

## 2017-10-21 ENCOUNTER — Other Ambulatory Visit: Payer: Self-pay

## 2017-10-21 DIAGNOSIS — G629 Polyneuropathy, unspecified: Secondary | ICD-10-CM

## 2017-10-21 MED ORDER — HYDROCODONE-ACETAMINOPHEN 7.5-325 MG PO TABS: ORAL_TABLET | ORAL | 0 refills | Status: DC

## 2017-10-23 ENCOUNTER — Other Ambulatory Visit: Payer: Self-pay | Admitting: Podiatry

## 2017-10-23 ENCOUNTER — Ambulatory Visit (INDEPENDENT_AMBULATORY_CARE_PROVIDER_SITE_OTHER): Payer: Commercial Managed Care - PPO

## 2017-10-23 ENCOUNTER — Ambulatory Visit: Payer: Self-pay | Admitting: Family Medicine

## 2017-10-23 ENCOUNTER — Ambulatory Visit: Payer: Commercial Managed Care - PPO | Admitting: Podiatry

## 2017-10-23 ENCOUNTER — Encounter: Payer: Self-pay | Admitting: Podiatry

## 2017-10-23 DIAGNOSIS — M778 Other enthesopathies, not elsewhere classified: Secondary | ICD-10-CM

## 2017-10-23 DIAGNOSIS — M722 Plantar fascial fibromatosis: Secondary | ICD-10-CM | POA: Diagnosis not present

## 2017-10-23 DIAGNOSIS — M779 Enthesopathy, unspecified: Secondary | ICD-10-CM

## 2017-10-23 DIAGNOSIS — M7752 Other enthesopathy of left foot: Secondary | ICD-10-CM

## 2017-10-23 DIAGNOSIS — M19072 Primary osteoarthritis, left ankle and foot: Secondary | ICD-10-CM | POA: Diagnosis not present

## 2017-10-23 MED ORDER — METHYLPREDNISOLONE 4 MG PO TABS: ORAL_TABLET | ORAL | 0 refills | Status: DC

## 2017-10-26 NOTE — Progress Notes (Signed)
   Subjective: 62 year old male with PMHx of T2DM presenting today as a new patient with a chief complaint of constant aching and intermittent sharp pain of the bilateral feet that began two months ago. He states the left foot is much worse than the right. Walking and standing increases the pain. He has been taking Vicodin, Tramadol and Lyrica as well as icing the areas with no significant relief. Patient is here for further evaluation and treatment.   Past Medical History:  Diagnosis Date  . Anxiety   . Hypertension      Objective: Physical Exam General: The patient is alert and oriented x3 in no acute distress.  Dermatology: Skin is warm, dry and supple bilateral lower extremities. Negative for open lesions or macerations bilateral.   Vascular: Dorsalis Pedis and Posterior Tibial pulses palpable bilateral.  Capillary fill time is immediate to all digits.  Neurological: Epicritic and protective threshold intact bilateral.   Musculoskeletal: Tenderness to palpation to the plantar aspect of the left heel along the plantar fascia and the lateral left foot. All other joints range of motion within normal limits bilateral. Strength 5/5 in all groups bilateral.   Radiographic exam:   Normal osseous mineralization. Joint spaces preserved. No fracture/dislocation/boney destruction. No other soft tissue abnormalities or radiopaque foreign bodies.   Assessment: 1. Plantar fasciitis left foot 2. Midfoot capsulitis/DJD left - lateral   Plan of Care:  1. Patient evaluated. Xrays reviewed.   2. Injection of 0.5cc Celestone soluspan injected into the left plantar fascia.  3. Injection of 0.5 mLs Celestone Soluspan injected into the lateral left midfoot.  4. Rx for Medrol Dose Pak placed. Then continue taking Mobic from PCP.  5. CAM boot dispensed. Weightbearing as tolerated.  6. Note for work provided to patient for no work for four weeks.  7. Return to clinic in 4 weeks.    Patient is a  pipe fitter.    Edrick Kins, DPM [facility]  Dr. Edrick Kins, DPM    [address], [place] [address]                Office [phone]  Fax [phone]

## 2017-11-08 ENCOUNTER — Other Ambulatory Visit: Payer: Self-pay | Admitting: Family Medicine

## 2017-11-20 ENCOUNTER — Ambulatory Visit: Payer: Commercial Managed Care - PPO | Admitting: Podiatry

## 2017-11-23 ENCOUNTER — Other Ambulatory Visit: Payer: Self-pay | Admitting: Family Medicine

## 2017-11-23 DIAGNOSIS — G629 Polyneuropathy, unspecified: Secondary | ICD-10-CM

## 2017-11-23 MED ORDER — HYDROCODONE-ACETAMINOPHEN 7.5-325 MG PO TABS: ORAL_TABLET | ORAL | 0 refills | Status: DC

## 2017-11-23 NOTE — Telephone Encounter (Signed)
Pt needs refill on   Hydrocodone 7.5-325  Kristopher Oppenheim  Pt's CB#  [phone]  Thanks [name]

## 2017-11-24 ENCOUNTER — Ambulatory Visit: Payer: Commercial Managed Care - PPO | Admitting: Podiatry

## 2017-11-24 ENCOUNTER — Encounter: Payer: Self-pay | Admitting: Podiatry

## 2017-11-24 ENCOUNTER — Other Ambulatory Visit: Payer: Self-pay

## 2017-11-24 ENCOUNTER — Telehealth: Payer: Self-pay | Admitting: Family Medicine

## 2017-11-24 DIAGNOSIS — M722 Plantar fascial fibromatosis: Secondary | ICD-10-CM

## 2017-11-24 DIAGNOSIS — M659 Synovitis and tenosynovitis, unspecified: Secondary | ICD-10-CM

## 2017-11-24 DIAGNOSIS — M778 Other enthesopathies, not elsewhere classified: Secondary | ICD-10-CM

## 2017-11-24 DIAGNOSIS — M779 Enthesopathy, unspecified: Secondary | ICD-10-CM

## 2017-11-24 DIAGNOSIS — M19072 Primary osteoarthritis, left ankle and foot: Secondary | ICD-10-CM

## 2017-11-24 DIAGNOSIS — M65972 Unspecified synovitis and tenosynovitis, left ankle and foot: Secondary | ICD-10-CM

## 2017-11-24 NOTE — Telephone Encounter (Signed)
We can either go back to gabapentin or try Cymbalta, whichever he would prefer.

## 2017-11-24 NOTE — Telephone Encounter (Signed)
Please advise 

## 2017-11-24 NOTE — Progress Notes (Signed)
re

## 2017-11-24 NOTE — Telephone Encounter (Signed)
Since being switched from Gabepentin 600 mg. Tid to Lyrica,  his foot pain is a lot worse. Patient see Dr. Amalia Hailey today and he told him it was from the neuropathy and wanted Mitch to check with you regarding this.   He wants to take something that's going help him but safe for him too.

## 2017-11-25 NOTE — Progress Notes (Signed)
   Subjective: 62 year old male with PMHx of T2DM presenting today for follow up evaluation of left foot and ankle pain. He reports continued intermittent sharp pain. He has been taking the Meloxicam but states it has not been helping. Walking and bearing weight increases the pain. Patient is here for further evaluation and treatment.    Past Medical History:  Diagnosis Date  . Anxiety   . Hypertension      Objective: Physical Exam General: The patient is alert and oriented x3 in no acute distress.  Dermatology: Skin is warm, dry and supple bilateral lower extremities. Negative for open lesions or macerations bilateral.   Vascular: Dorsalis Pedis and Posterior Tibial pulses palpable bilateral.  Capillary fill time is immediate to all digits.  Neurological: Epicritic and protective threshold intact bilateral.   Musculoskeletal: Tenderness to palpation to the plantar aspect of the left heel along the plantar fascia and the lateral left foot as well as to the lateral left ankle. All other joints range of motion within normal limits bilateral. Strength 5/5 in all groups bilateral.    Assessment: 1. Plantar fasciitis left foot - improved  2. Midfoot capsulitis/DJD left - lateral  3. Synovitis lateral left ankle 4. Severe peripheral neuropathy secondary to DM  Plan of Care:  1. Patient evaluated.   2. Injection of 0.5cc Celestone soluspan injected into the left ankle joint.  3. Injection of 0.5 mLs Celestone Soluspan injected into the lateral left midfoot.  4. Continue weightbearing in CAM boot.  5. Extension for disability completed today for 6 more weeks.  6. Order for physical therapy three times weekly for four weeks with neurogenix treatment modalities.  7. Return to clinic in 4 weeks.    Patient is a pipe fitter.    Edrick Kins, DPM [facility]  Dr. Edrick Kins, DPM    [address], [place] [address]                 Office [phone]  Fax [phone]

## 2017-11-25 NOTE — Telephone Encounter (Signed)
Patient has started taking the Lyrica 225 mg 2 tablets tid. Patient states so far this has helped with pain better then anything else he's tried. Patient wants to hold off on any changes for now.

## 2017-11-25 NOTE — Telephone Encounter (Signed)
Please advise 

## 2017-11-27 ENCOUNTER — Telehealth: Payer: Self-pay | Admitting: Podiatry

## 2017-11-27 NOTE — Telephone Encounter (Signed)
I called and informed patient that referral was given to [facility] today and they will be contacting him in the next several days for an appt.

## 2017-11-27 NOTE — Telephone Encounter (Signed)
I saw Dr. Amalia Hailey in the office on Tuesday and had referred me to physical therapy in [place]. He said they would be contacting me and I have not received a phone call. If you could please let me know what is going on with that. Thank you.

## 2017-11-30 ENCOUNTER — Telehealth: Payer: Self-pay | Admitting: Family Medicine

## 2017-11-30 NOTE — Telephone Encounter (Signed)
Patient called back and stated he has been taking Lyrica 225 mg 2 tablets tid. Patient states this dose seems to be helping him with pain about 80%. Patient wanted to know if dose 225 mg can be increased? Please advise?

## 2017-11-30 NOTE — Telephone Encounter (Signed)
Pt stated that he was supposed to call Sharyn Lull back to discuss his Lyrica and he request Sharyn Lull to return his call. Pt would discuss with me. Please advise. Thanks TNP

## 2017-12-01 NOTE — Telephone Encounter (Signed)
The maximum approved dose is 225mg  three times daily.  He is requesting [place] to prescribe double the maximum dose which we cannot do. Suggest he go to pain clinic

## 2017-12-01 NOTE — Telephone Encounter (Signed)
Tried calling patient. Left message to call back. 

## 2017-12-02 ENCOUNTER — Ambulatory Visit: Payer: Commercial Managed Care - PPO | Admitting: Podiatry

## 2017-12-02 DIAGNOSIS — M25672 Stiffness of left ankle, not elsewhere classified: Secondary | ICD-10-CM | POA: Diagnosis not present

## 2017-12-02 DIAGNOSIS — M25572 Pain in left ankle and joints of left foot: Secondary | ICD-10-CM | POA: Diagnosis not present

## 2017-12-02 DIAGNOSIS — R269 Unspecified abnormalities of gait and mobility: Secondary | ICD-10-CM | POA: Diagnosis not present

## 2017-12-04 NOTE — Telephone Encounter (Signed)
Advised patient as below. He would like to speak with Sharyn Lull. He does not want to go to the pain clinic. Please call patient. Thanks!

## 2017-12-07 ENCOUNTER — Telehealth: Payer: Self-pay | Admitting: Podiatry

## 2017-12-07 NOTE — Telephone Encounter (Signed)
I need to speak to someone about my insurance papers. The proper papers have not been forwarded to the insurance company to continue my claim. So now I'm sitting here with no money coming in and I need to get this straight. I need to talk to somebody please. [phone].

## 2017-12-07 NOTE — Telephone Encounter (Signed)
LMOVM for pt to return call 

## 2017-12-08 DIAGNOSIS — M25672 Stiffness of left ankle, not elsewhere classified: Secondary | ICD-10-CM | POA: Diagnosis not present

## 2017-12-08 DIAGNOSIS — M25572 Pain in left ankle and joints of left foot: Secondary | ICD-10-CM | POA: Diagnosis not present

## 2017-12-08 DIAGNOSIS — R269 Unspecified abnormalities of gait and mobility: Secondary | ICD-10-CM | POA: Diagnosis not present

## 2017-12-09 ENCOUNTER — Encounter: Payer: Self-pay | Admitting: Family Medicine

## 2017-12-09 ENCOUNTER — Ambulatory Visit: Payer: Commercial Managed Care - PPO | Admitting: Family Medicine

## 2017-12-09 VITALS — BP 130/80 | HR 90 | Temp 97.9°F | Resp 16 | Wt 248.0 lb

## 2017-12-09 DIAGNOSIS — G629 Polyneuropathy, unspecified: Secondary | ICD-10-CM

## 2017-12-09 DIAGNOSIS — Z23 Encounter for immunization: Secondary | ICD-10-CM | POA: Diagnosis not present

## 2017-12-09 DIAGNOSIS — I10 Essential (primary) hypertension: Secondary | ICD-10-CM

## 2017-12-09 MED ORDER — RAMIPRIL 10 MG PO CAPS: 10.0000 mg | ORAL_CAPSULE | Freq: Every day | ORAL | 5 refills | Status: DC

## 2017-12-09 NOTE — Progress Notes (Signed)
Patient: Dustin Guerra Male    DOB: February 04, 1956   62 y.o.   MRN: [id number] Visit Date: 12/09/2017  Today's Provider: Lelon Huh, MD   Chief Complaint  Patient presents with  . Follow-up  . Hypertension   Subjective:    HPI  Hypertension, follow-up:  BP Readings from Last 3 Encounters:  12/09/17 130/80  10/02/17 (!) 156/90  08/24/17 (!) 100/48    He was last seen for hypertension 3 months ago.  BP at that visit was 100/48. Management since that visit includes no changes. He reports good compliance with treatment. Patient states the Micardis is on back order and he only has 3 pills left.  He is not having side effects.  He is not exercising. He is not adherent to low salt diet.   Outside blood pressures are not being checked. He is experiencing none.  Patient denies chest pain, chest pressure/discomfort, claudication, dyspnea, exertional chest pressure/discomfort, fatigue, irregular heart beat, lower extremity edema, near-syncope, orthopnea, palpitations, paroxysmal nocturnal dyspnea, syncope and tachypnea.   Cardiovascular risk factors include advanced age (older than 21 for men, 66 for women), hypertension and male gender.  Use of agents associated with hypertension: NSAIDS.     Weight trend: fluctuating a bit Wt Readings from Last 3 Encounters:  12/09/17 248 lb (112.5 kg)  10/02/17 234 lb (106.1 kg)  08/24/17 232 lb (105.2 kg)    Current diet: well balanced  ------------------------------------------------------------------------ Neuropathy: Patient comes in today wanting to discuss treatment for neuropathy. He states his pain has progressively worsened and he has needed to take more Lyrica. He states he took 2 x 225mg  tablets three times a day for several day which was effective, but sent back to 1 tablet three times a day when he was advised that he was taking much higher than the highest approved dose. He is tolerating 225mg  three times a day  with no adverse effects.     No Known Allergies   Current Outpatient Medications:  .  aspirin 81 MG tablet, Take by mouth., Disp: , Rfl:  .  Azelaic Acid (FINACEA) 15 % cream, After skin is thoroughly washed and patted dry, gently but thoroughly massage a thin film of azelaic acid cream into the affected area twice daily, in the morning and evening., Disp: 50 g, Rfl: 5 .  cilostazol (PLETAL) 100 MG tablet, TAKE ONE TABLET BY MOUTH TWO TIMES A DAY AS NEEDED FOR CIRCULATION, Disp: 60 tablet, Rfl: 12 .  diazepam (VALIUM) 10 MG tablet, TAKE ONE TABLET BY MOUTH DAILY AS NEEDED FOR ANXIETY, Disp: 30 tablet, Rfl: 3 .  docusate sodium (COLACE) 100 MG capsule, Take 100 mg by mouth 2 (two) times daily., Disp: , Rfl:  .  EPINEPHrine (EPIPEN 2-PAK) 0.3 mg/0.3 mL IJ SOAJ injection, as needed. , Disp: , Rfl:  .  HYDROcodone-acetaminophen (NORCO) 7.5-325 MG tablet, 1/2-1 tablet up to four times a day as needed, Disp: 120 tablet, Rfl: 0 .  IRON PO, Take by mouth., Disp: , Rfl:  .  meloxicam (MOBIC) 15 MG tablet, TAKE ONE TABLET BY MOUTH DAILY, Disp: 30 tablet, Rfl: 0 .  methylPREDNISolone (MEDROL) 4 MG tablet, Take as directed, Disp: 21 tablet, Rfl: 0 .  Multiple Vitamins-Minerals (CENTRUM SILVER) CHEW, Chew by mouth., Disp: , Rfl:  .  omeprazole (PRILOSEC) 40 MG capsule, Take 1 capsule by mouth daily., Disp: , Rfl:  .  pravastatin (PRAVACHOL) 40 MG tablet, Take 1 tablet (40 mg total) by  mouth daily., Disp: 30 tablet, Rfl: 5 .  pregabalin (LYRICA) 225 MG capsule, Take 1 capsule (225 mg total) by mouth 3 (three) times daily., Disp: 90 capsule, Rfl: 3 .  sildenafil (VIAGRA) 100 MG tablet, TAKE 0.5-1 TABLET BY MOUTH DAILY AS NEEDED FOR ERECTILE DYSFUNCTION, Disp: 5 tablet, Rfl: 8 .  telmisartan (MICARDIS) 40 MG tablet, TAKE ONE TABLET BY MOUTH DAILY THIS REPLACES IRBESARTAN, Disp: 30 tablet, Rfl: 5 .  terbinafine (LAMISIL) 250 MG tablet, TAKE TWO TABLETS BY MOUTH DAILY FOR 7 DAYS EACH MONTH, Disp: 14 tablet,  Rfl: 4 .  traMADol (ULTRAM) 50 MG tablet, TAKE ONE TABLET BY MOUTH THREE TIMES A DAY AS NEEDED, Disp: 90 tablet, Rfl: 4  Review of Systems  Constitutional: Negative for appetite change, chills and fever.  Respiratory: Negative for chest tightness, shortness of breath and wheezing.   Cardiovascular: Negative for chest pain and palpitations.  Gastrointestinal: Negative for abdominal pain, nausea and vomiting.  Musculoskeletal: Positive for arthralgias and myalgias. Negative for joint swelling.    Social History   Tobacco Use  . Smoking status: Former Research scientist (life sciences)  . Smokeless tobacco: Never Used  . Tobacco comment: quit 2000, smoked about 1.5 packs for about 5 years  Substance Use Topics  . Alcohol use: No   Objective:   BP 130/80 (BP Location: Left Arm, Patient Position: Sitting, Cuff Size: Large)   Pulse 90   Temp 97.9 F (36.6 C) (Oral)   Resp 16   Wt 248 lb (112.5 kg)   SpO2 96% Comment: room air  BMI 31.84 kg/m  Vitals:   12/09/17 1611  BP: 130/80  Pulse: 90  Resp: 16  Temp: 97.9 F (36.6 C)  TempSrc: Oral  SpO2: 96%  Weight: 248 lb (112.5 kg)     Physical Exam   General Appearance:    Alert, cooperative, no distress  Eyes:    PERRL, conjunctiva/corneas clear, EOM's intact       Lungs:     Clear to auscultation bilaterally, respirations unlabored  Heart:    Regular rate and rhythm  Neurologic:   Awake, alert, oriented x 3. No apparent focal neurological           defect.          Assessment & Plan:      1. Neuropathy Doing fairly well on 286m Lyrica three times daily. Continue current medications.    2. Essential hypertension Well controlled, but unable to get Micardis refilled due to multiple [employer] recalls. Will change to - ramipril (ALTACE) 10 MG capsule; Take 1 capsule (10 mg total) by mouth daily.  Dispense: 30 capsule; Refill: 5  3. Need for shingles vaccine  - Varicella-zoster vaccine IM  Return in about 3 months (around 03/23/2018).       Lelon Huh, MD  [facility]

## 2017-12-10 ENCOUNTER — Other Ambulatory Visit: Payer: Self-pay | Admitting: Family Medicine

## 2017-12-10 DIAGNOSIS — M25672 Stiffness of left ankle, not elsewhere classified: Secondary | ICD-10-CM | POA: Diagnosis not present

## 2017-12-10 DIAGNOSIS — M25572 Pain in left ankle and joints of left foot: Secondary | ICD-10-CM | POA: Diagnosis not present

## 2017-12-10 DIAGNOSIS — R269 Unspecified abnormalities of gait and mobility: Secondary | ICD-10-CM | POA: Diagnosis not present

## 2017-12-11 ENCOUNTER — Other Ambulatory Visit: Payer: Self-pay | Admitting: Family Medicine

## 2017-12-11 DIAGNOSIS — G629 Polyneuropathy, unspecified: Secondary | ICD-10-CM

## 2017-12-15 DIAGNOSIS — M25572 Pain in left ankle and joints of left foot: Secondary | ICD-10-CM | POA: Diagnosis not present

## 2017-12-15 DIAGNOSIS — R269 Unspecified abnormalities of gait and mobility: Secondary | ICD-10-CM | POA: Diagnosis not present

## 2017-12-15 DIAGNOSIS — M25672 Stiffness of left ankle, not elsewhere classified: Secondary | ICD-10-CM | POA: Diagnosis not present

## 2017-12-17 DIAGNOSIS — R269 Unspecified abnormalities of gait and mobility: Secondary | ICD-10-CM | POA: Diagnosis not present

## 2017-12-17 DIAGNOSIS — M25572 Pain in left ankle and joints of left foot: Secondary | ICD-10-CM | POA: Diagnosis not present

## 2017-12-17 DIAGNOSIS — M25672 Stiffness of left ankle, not elsewhere classified: Secondary | ICD-10-CM | POA: Diagnosis not present

## 2017-12-21 ENCOUNTER — Encounter: Payer: Self-pay | Admitting: Podiatry

## 2017-12-21 ENCOUNTER — Ambulatory Visit: Payer: Commercial Managed Care - PPO | Admitting: Podiatry

## 2017-12-21 DIAGNOSIS — M19072 Primary osteoarthritis, left ankle and foot: Secondary | ICD-10-CM

## 2017-12-21 DIAGNOSIS — E1143 Type 2 diabetes mellitus with diabetic autonomic (poly)neuropathy: Secondary | ICD-10-CM

## 2017-12-21 DIAGNOSIS — M65972 Unspecified synovitis and tenosynovitis, left ankle and foot: Secondary | ICD-10-CM

## 2017-12-21 DIAGNOSIS — M659 Synovitis and tenosynovitis, unspecified: Secondary | ICD-10-CM | POA: Diagnosis not present

## 2017-12-21 NOTE — Progress Notes (Signed)
   Subjective: 62 year old male with PMHx of T2DM presenting today for follow up evaluation of left foot and ankle pain.  Patient states that the physical therapy and the new prescription of Lyrica by his primary care physician has helped alleviate some of his symptoms.  He states that he has better range of motion with his foot and ankle.  He presents for follow-up treatment evaluation   Past Medical History:  Diagnosis Date  . Anxiety   . Hypertension      Objective: Physical Exam General: The patient is alert and oriented x3 in no acute distress.  Dermatology: Skin is warm, dry and supple bilateral lower extremities. Negative for open lesions or macerations bilateral.   Vascular: Dorsalis Pedis and Posterior Tibial pulses palpable bilateral.  Capillary fill time is immediate to all digits.  Neurological: Epicritic and protective threshold diminished bilateral.  Severe peripheral lower extremity neuropathy noted bilateral lower extremities.  Musculoskeletal: Tenderness to palpation to the plantar aspect of the left heel along the plantar fascia and the lateral left foot as well as to the lateral left ankle. All other joints range of motion within normal limits bilateral. Strength 5/5 in all groups bilateral.    Assessment: 1. Plantar fasciitis left foot - improved  2. Midfoot capsulitis/DJD left lateral - improved 3. Synovitis lateral left ankle 4. Severe peripheral neuropathy secondary to DM  Plan of Care:  1. Patient evaluated.   2. Injection of 0.5cc Celestone soluspan injected into the left ankle joint.  3.  Continue weightbearing in the immobilization cam boot.  I did explain to the patient that he can begin to transition out of the cam boot. 4.  Continue physical therapy 2 times per week using neurogenix modalities to treat symptoms of peripheral neuropathy 5.  Note today was provided for disability extension times an additional 6 weeks. 6. Continue Lyrica as per PCP, Dr.  Alm Bustard 7. Return to clinic in 6 weeks  Patient is a pipe fitter.    Edrick Kins, DPM [facility]  Dr. Edrick Kins, DPM    [address], [place] [address]                Office [phone]  Fax [phone]

## 2017-12-22 ENCOUNTER — Other Ambulatory Visit: Payer: Self-pay | Admitting: Family Medicine

## 2017-12-22 ENCOUNTER — Ambulatory Visit: Payer: Commercial Managed Care - PPO | Admitting: Podiatry

## 2017-12-22 DIAGNOSIS — R269 Unspecified abnormalities of gait and mobility: Secondary | ICD-10-CM | POA: Diagnosis not present

## 2017-12-22 DIAGNOSIS — G629 Polyneuropathy, unspecified: Secondary | ICD-10-CM

## 2017-12-22 DIAGNOSIS — M25572 Pain in left ankle and joints of left foot: Secondary | ICD-10-CM | POA: Diagnosis not present

## 2017-12-22 DIAGNOSIS — M25672 Stiffness of left ankle, not elsewhere classified: Secondary | ICD-10-CM | POA: Diagnosis not present

## 2017-12-22 MED ORDER — HYDROCODONE-ACETAMINOPHEN 7.5-325 MG PO TABS: ORAL_TABLET | ORAL | 0 refills | Status: DC

## 2017-12-22 NOTE — Telephone Encounter (Signed)
Pt needing a refill on: ° °HYDROcodone-acetaminophen (NORCO) 7.5-325 MG tablet ° °Please fill at: ° °[facility] [place], [place] - [address] [phone] (Phone) °[phone] (Fax)  ° °Thanks, °[facility] ° °

## 2017-12-25 DIAGNOSIS — M25572 Pain in left ankle and joints of left foot: Secondary | ICD-10-CM | POA: Diagnosis not present

## 2017-12-25 DIAGNOSIS — M25672 Stiffness of left ankle, not elsewhere classified: Secondary | ICD-10-CM | POA: Diagnosis not present

## 2017-12-25 DIAGNOSIS — R269 Unspecified abnormalities of gait and mobility: Secondary | ICD-10-CM | POA: Diagnosis not present

## 2017-12-28 ENCOUNTER — Other Ambulatory Visit: Payer: Self-pay | Admitting: Family Medicine

## 2017-12-28 DIAGNOSIS — M25572 Pain in left ankle and joints of left foot: Secondary | ICD-10-CM | POA: Diagnosis not present

## 2017-12-28 DIAGNOSIS — M25672 Stiffness of left ankle, not elsewhere classified: Secondary | ICD-10-CM | POA: Diagnosis not present

## 2017-12-28 DIAGNOSIS — R269 Unspecified abnormalities of gait and mobility: Secondary | ICD-10-CM | POA: Diagnosis not present

## 2017-12-30 DIAGNOSIS — M25672 Stiffness of left ankle, not elsewhere classified: Secondary | ICD-10-CM | POA: Diagnosis not present

## 2017-12-30 DIAGNOSIS — R269 Unspecified abnormalities of gait and mobility: Secondary | ICD-10-CM | POA: Diagnosis not present

## 2017-12-30 DIAGNOSIS — M25572 Pain in left ankle and joints of left foot: Secondary | ICD-10-CM | POA: Diagnosis not present

## 2018-01-04 DIAGNOSIS — M25672 Stiffness of left ankle, not elsewhere classified: Secondary | ICD-10-CM | POA: Diagnosis not present

## 2018-01-04 DIAGNOSIS — R269 Unspecified abnormalities of gait and mobility: Secondary | ICD-10-CM | POA: Diagnosis not present

## 2018-01-04 DIAGNOSIS — M25572 Pain in left ankle and joints of left foot: Secondary | ICD-10-CM | POA: Diagnosis not present

## 2018-01-06 DIAGNOSIS — R269 Unspecified abnormalities of gait and mobility: Secondary | ICD-10-CM | POA: Diagnosis not present

## 2018-01-06 DIAGNOSIS — M25572 Pain in left ankle and joints of left foot: Secondary | ICD-10-CM | POA: Diagnosis not present

## 2018-01-06 DIAGNOSIS — M25672 Stiffness of left ankle, not elsewhere classified: Secondary | ICD-10-CM | POA: Diagnosis not present

## 2018-01-11 DIAGNOSIS — M25572 Pain in left ankle and joints of left foot: Secondary | ICD-10-CM | POA: Diagnosis not present

## 2018-01-11 DIAGNOSIS — M25672 Stiffness of left ankle, not elsewhere classified: Secondary | ICD-10-CM | POA: Diagnosis not present

## 2018-01-11 DIAGNOSIS — R269 Unspecified abnormalities of gait and mobility: Secondary | ICD-10-CM | POA: Diagnosis not present

## 2018-01-13 DIAGNOSIS — R269 Unspecified abnormalities of gait and mobility: Secondary | ICD-10-CM | POA: Diagnosis not present

## 2018-01-13 DIAGNOSIS — M25672 Stiffness of left ankle, not elsewhere classified: Secondary | ICD-10-CM | POA: Diagnosis not present

## 2018-01-13 DIAGNOSIS — M25572 Pain in left ankle and joints of left foot: Secondary | ICD-10-CM | POA: Diagnosis not present

## 2018-01-18 ENCOUNTER — Other Ambulatory Visit: Payer: Self-pay | Admitting: Family Medicine

## 2018-01-18 DIAGNOSIS — M25672 Stiffness of left ankle, not elsewhere classified: Secondary | ICD-10-CM | POA: Diagnosis not present

## 2018-01-18 DIAGNOSIS — R269 Unspecified abnormalities of gait and mobility: Secondary | ICD-10-CM | POA: Diagnosis not present

## 2018-01-18 DIAGNOSIS — M25572 Pain in left ankle and joints of left foot: Secondary | ICD-10-CM | POA: Diagnosis not present

## 2018-01-20 DIAGNOSIS — Z9989 Dependence on other enabling machines and devices: Secondary | ICD-10-CM | POA: Diagnosis not present

## 2018-01-20 DIAGNOSIS — R269 Unspecified abnormalities of gait and mobility: Secondary | ICD-10-CM | POA: Diagnosis not present

## 2018-01-20 DIAGNOSIS — M25672 Stiffness of left ankle, not elsewhere classified: Secondary | ICD-10-CM | POA: Diagnosis not present

## 2018-01-21 ENCOUNTER — Other Ambulatory Visit: Payer: Self-pay

## 2018-01-21 DIAGNOSIS — G629 Polyneuropathy, unspecified: Secondary | ICD-10-CM

## 2018-01-21 MED ORDER — HYDROCODONE-ACETAMINOPHEN 7.5-325 MG PO TABS: ORAL_TABLET | ORAL | 0 refills | Status: DC

## 2018-01-25 DIAGNOSIS — Z9989 Dependence on other enabling machines and devices: Secondary | ICD-10-CM | POA: Diagnosis not present

## 2018-01-25 DIAGNOSIS — M25672 Stiffness of left ankle, not elsewhere classified: Secondary | ICD-10-CM | POA: Diagnosis not present

## 2018-01-25 DIAGNOSIS — R269 Unspecified abnormalities of gait and mobility: Secondary | ICD-10-CM | POA: Diagnosis not present

## 2018-01-27 DIAGNOSIS — M25672 Stiffness of left ankle, not elsewhere classified: Secondary | ICD-10-CM | POA: Diagnosis not present

## 2018-01-27 DIAGNOSIS — R269 Unspecified abnormalities of gait and mobility: Secondary | ICD-10-CM | POA: Diagnosis not present

## 2018-01-27 DIAGNOSIS — Z9989 Dependence on other enabling machines and devices: Secondary | ICD-10-CM | POA: Diagnosis not present

## 2018-02-01 ENCOUNTER — Encounter: Payer: Self-pay | Admitting: Podiatry

## 2018-02-01 ENCOUNTER — Other Ambulatory Visit: Payer: Self-pay | Admitting: Podiatry

## 2018-02-01 ENCOUNTER — Ambulatory Visit: Payer: Commercial Managed Care - PPO | Admitting: Podiatry

## 2018-02-01 ENCOUNTER — Telehealth: Payer: Self-pay | Admitting: *Deleted

## 2018-02-01 DIAGNOSIS — M659 Synovitis and tenosynovitis, unspecified: Secondary | ICD-10-CM

## 2018-02-01 DIAGNOSIS — M779 Enthesopathy, unspecified: Secondary | ICD-10-CM

## 2018-02-01 DIAGNOSIS — M19072 Primary osteoarthritis, left ankle and foot: Secondary | ICD-10-CM | POA: Diagnosis not present

## 2018-02-01 DIAGNOSIS — E1143 Type 2 diabetes mellitus with diabetic autonomic (poly)neuropathy: Secondary | ICD-10-CM

## 2018-02-01 DIAGNOSIS — Z9989 Dependence on other enabling machines and devices: Secondary | ICD-10-CM | POA: Diagnosis not present

## 2018-02-01 DIAGNOSIS — G8929 Other chronic pain: Secondary | ICD-10-CM

## 2018-02-01 DIAGNOSIS — M25672 Stiffness of left ankle, not elsewhere classified: Secondary | ICD-10-CM | POA: Diagnosis not present

## 2018-02-01 DIAGNOSIS — R269 Unspecified abnormalities of gait and mobility: Secondary | ICD-10-CM | POA: Diagnosis not present

## 2018-02-01 DIAGNOSIS — M778 Other enthesopathies, not elsewhere classified: Secondary | ICD-10-CM

## 2018-02-01 DIAGNOSIS — M25572 Pain in left ankle and joints of left foot: Principal | ICD-10-CM

## 2018-02-01 DIAGNOSIS — T1590XS Foreign body on external eye, part unspecified, unspecified eye, sequela: Secondary | ICD-10-CM

## 2018-02-01 NOTE — Telephone Encounter (Signed)
Orders given to Gretta Arab, RN, faxed orders to [facility].

## 2018-02-01 NOTE — Telephone Encounter (Signed)
-----   Message from Edrick Kins, DPM sent at 02/01/2018  9:25 AM EST ----- Regarding: MRI left ankle Please order MRI left ankle without contrast  Diagnosis: Chronic left ankle pain.  DJD left ankle.  Possible Charcot left ankle.  Thanks, Dr. Amalia Hailey

## 2018-02-01 NOTE — Progress Notes (Signed)
Testing prior to MRI lower extremity

## 2018-02-02 ENCOUNTER — Ambulatory Visit: Payer: Commercial Managed Care - PPO

## 2018-02-03 NOTE — Progress Notes (Signed)
   Subjective: 62 year old male with PMHx of T2DM presenting today for follow up evaluation of left foot and ankle pain as well as peripheral neuropathy. He reports continued pain to the dorsal aspect and medial arch and heel. He has been using the CAM boot but reports continued pain and swelling. He states the Gabapentin has helped his symptoms. Walking increases the pain. Patient is here for further evaluation and treatment.   Past Medical History:  Diagnosis Date  . Anxiety   . Hypertension      Objective: Physical Exam General: The patient is alert and oriented x3 in no acute distress.  Dermatology: Skin is warm, dry and supple bilateral lower extremities. Negative for open lesions or macerations bilateral.   Vascular: Left ankle warm compared to right. Moderate edema noted. Dorsalis Pedis and Posterior Tibial pulses palpable bilateral.  Capillary fill time is immediate to all digits.  Neurological: Epicritic and protective threshold diminished bilateral.  Severe peripheral lower extremity neuropathy noted bilateral lower extremities.  Musculoskeletal: Tenderness to palpation to the plantar aspect of the left heel along the plantar fascia and the lateral left foot as well as to the lateral left ankle. All other joints range of motion within normal limits bilateral. Strength 5/5 in all groups bilateral.    Assessment: 1. Plantar fasciitis left foot  2. Midfoot capsulitis/DJD left lateral  3. Synovitis lateral left ankle 4. Severe peripheral neuropathy secondary to DM - improved   Plan of Care:  1. Patient evaluated.   2. Continue using CAM boot as needed.  3. Compression anklet dispensed.  4. Order for MRI of left ankle placed.  5. Continue physical therapy 2 times per week using neurogenix modalities to treat symptoms of peripheral neuropathy 6. Note today was provided for disability extension times an additional 6 weeks. 7. Continue Lyrica as per PCP, Dr. Alm Bustard 8. Return  to clinic in 4 weeks to review MRI results.   Patient is a pipe fitter.   Edrick Kins, DPM [facility]  Dr. Edrick Kins, DPM    [address], [place] [address]                Office [phone]  Fax [phone]

## 2018-02-08 ENCOUNTER — Telehealth: Payer: Self-pay | Admitting: Podiatry

## 2018-02-08 DIAGNOSIS — R269 Unspecified abnormalities of gait and mobility: Secondary | ICD-10-CM | POA: Diagnosis not present

## 2018-02-08 DIAGNOSIS — Z9989 Dependence on other enabling machines and devices: Secondary | ICD-10-CM | POA: Diagnosis not present

## 2018-02-08 DIAGNOSIS — M25672 Stiffness of left ankle, not elsewhere classified: Secondary | ICD-10-CM | POA: Diagnosis not present

## 2018-02-08 NOTE — Telephone Encounter (Signed)
[employer] called wanting to know the status of long term disability records request. Please call back at [phone] and reference claim# [id number].

## 2018-02-09 ENCOUNTER — Telehealth: Payer: Self-pay | Admitting: *Deleted

## 2018-02-09 DIAGNOSIS — M779 Enthesopathy, unspecified: Principal | ICD-10-CM

## 2018-02-09 DIAGNOSIS — M778 Other enthesopathies, not elsewhere classified: Secondary | ICD-10-CM

## 2018-02-09 NOTE — Telephone Encounter (Signed)
I informed pt I had ordered the MRI of the left foot from Dr. Amalia Hailey orders and it may not be pre-certed by tomorrow. I told pt that often when ordering the ankle imaging, doctors will order to have as much of the foot imaged as possible with the ankle. I told pt that was done in this case. I told pt to point to the area of the foot that is problematic and they will be able to tell him if it is covered in the initial requested ankle view with request for midfoot included.

## 2018-02-09 NOTE — Telephone Encounter (Signed)
-----   Message from Edrick Kins, DPM sent at 02/08/2018  5:46 PM EST ----- Regarding: MRI foot Please add MRI foot. Dx: capsulitis midfoot Thanks, Dr. Amalia Hailey  ----- Message ----- From: Arlyce Dice Sent: 02/08/2018   2:12 PM EST To: Andres Ege, RN, Edrick Kins, DPM  Hi guys, so I just got a phone call from this patient he is scheduled to have an MRI done this Wednesday, but stated that the MRI was only for his ankle and he wants to see if Dr.Evans will add that it needs to include his foot as well so that he doesn't have to possibly schedule a 2nd MRI for his foot. If you can let me know if this is possible or not so I can give him a call back either today or 1st thing tomorrow. Thankyou -

## 2018-02-10 ENCOUNTER — Ambulatory Visit
Admission: RE | Admit: 2018-02-10 | Discharge: 2018-02-10 | Disposition: A | Discharge status: Home / Self Care | Payer: Commercial Managed Care - PPO | Source: Ambulatory Visit | Attending: Podiatry | Admitting: Podiatry

## 2018-02-10 DIAGNOSIS — T1591XS Foreign body on external eye, part unspecified, right eye, sequela: Secondary | ICD-10-CM | POA: Diagnosis not present

## 2018-02-10 DIAGNOSIS — T1590XS Foreign body on external eye, part unspecified, unspecified eye, sequela: Secondary | ICD-10-CM

## 2018-02-10 DIAGNOSIS — M779 Enthesopathy, unspecified: Secondary | ICD-10-CM | POA: Insufficient documentation

## 2018-02-10 DIAGNOSIS — Z9989 Dependence on other enabling machines and devices: Secondary | ICD-10-CM | POA: Diagnosis not present

## 2018-02-10 DIAGNOSIS — R269 Unspecified abnormalities of gait and mobility: Secondary | ICD-10-CM | POA: Diagnosis not present

## 2018-02-10 DIAGNOSIS — Z77018 Contact with and (suspected) exposure to other hazardous metals: Secondary | ICD-10-CM | POA: Diagnosis not present

## 2018-02-10 DIAGNOSIS — M19072 Primary osteoarthritis, left ankle and foot: Secondary | ICD-10-CM | POA: Insufficient documentation

## 2018-02-10 DIAGNOSIS — M25672 Stiffness of left ankle, not elsewhere classified: Secondary | ICD-10-CM | POA: Diagnosis not present

## 2018-02-15 DIAGNOSIS — M25672 Stiffness of left ankle, not elsewhere classified: Secondary | ICD-10-CM | POA: Diagnosis not present

## 2018-02-15 DIAGNOSIS — R269 Unspecified abnormalities of gait and mobility: Secondary | ICD-10-CM | POA: Diagnosis not present

## 2018-02-15 DIAGNOSIS — Z9989 Dependence on other enabling machines and devices: Secondary | ICD-10-CM | POA: Diagnosis not present

## 2018-02-17 DIAGNOSIS — R269 Unspecified abnormalities of gait and mobility: Secondary | ICD-10-CM | POA: Diagnosis not present

## 2018-02-17 DIAGNOSIS — M25672 Stiffness of left ankle, not elsewhere classified: Secondary | ICD-10-CM | POA: Diagnosis not present

## 2018-02-17 DIAGNOSIS — Z9989 Dependence on other enabling machines and devices: Secondary | ICD-10-CM | POA: Diagnosis not present

## 2018-02-18 ENCOUNTER — Other Ambulatory Visit: Payer: Self-pay | Admitting: Family Medicine

## 2018-02-18 DIAGNOSIS — I10 Essential (primary) hypertension: Secondary | ICD-10-CM | POA: Diagnosis not present

## 2018-02-18 DIAGNOSIS — G629 Polyneuropathy, unspecified: Secondary | ICD-10-CM

## 2018-02-18 DIAGNOSIS — Z6831 Body mass index (BMI) 31.0-31.9, adult: Secondary | ICD-10-CM | POA: Diagnosis not present

## 2018-02-18 DIAGNOSIS — M5416 Radiculopathy, lumbar region: Secondary | ICD-10-CM | POA: Diagnosis not present

## 2018-02-18 MED ORDER — SILDENAFIL CITRATE 100 MG PO TABS: ORAL_TABLET | ORAL | 8 refills | Status: DC

## 2018-02-18 MED ORDER — HYDROCODONE-ACETAMINOPHEN 7.5-325 MG PO TABS: ORAL_TABLET | ORAL | 0 refills | Status: DC

## 2018-02-18 MED ORDER — PREGABALIN 225 MG PO CAPS: 225.0000 mg | ORAL_CAPSULE | Freq: Three times a day (TID) | ORAL | 3 refills | Status: DC

## 2018-02-18 NOTE — Telephone Encounter (Signed)
[place] faxed refill request for the following medications:  1. sildenafil (VIAGRA) 100 MG tablet  2. pregabalin (LYRICA) 225 MG capsule  LOV: 12/09/17 NOV: 03/26/2018 Please advise. Thanks TNP

## 2018-02-18 NOTE — Telephone Encounter (Signed)
[place] faxed refill request for the following medications:  sildenafil (VIAGRA) 100 MG tablet  Last Rx: 04/27/17 with 8 refills LOV: 12/09/17 NOV: 03/26/2018 Please advise. Thanks TNP

## 2018-02-18 NOTE — Telephone Encounter (Signed)
Pt needing a refill on: HYDROcodone-acetaminophen ([place]) 7.5-325 MG tablet  Please fill at: [place], [place] [phone] (Phone) [phone] (Fax)    Thanks, [place]

## 2018-02-22 DIAGNOSIS — Z9989 Dependence on other enabling machines and devices: Secondary | ICD-10-CM | POA: Diagnosis not present

## 2018-02-22 DIAGNOSIS — R269 Unspecified abnormalities of gait and mobility: Secondary | ICD-10-CM | POA: Diagnosis not present

## 2018-02-22 DIAGNOSIS — M25672 Stiffness of left ankle, not elsewhere classified: Secondary | ICD-10-CM | POA: Diagnosis not present

## 2018-02-23 ENCOUNTER — Other Ambulatory Visit (HOSPITAL_COMMUNITY): Payer: Self-pay | Admitting: Neurological Surgery

## 2018-02-23 ENCOUNTER — Other Ambulatory Visit: Payer: Self-pay | Admitting: Neurological Surgery

## 2018-02-23 DIAGNOSIS — M5416 Radiculopathy, lumbar region: Secondary | ICD-10-CM

## 2018-02-24 DIAGNOSIS — Z9989 Dependence on other enabling machines and devices: Secondary | ICD-10-CM | POA: Diagnosis not present

## 2018-02-24 DIAGNOSIS — M25672 Stiffness of left ankle, not elsewhere classified: Secondary | ICD-10-CM | POA: Diagnosis not present

## 2018-02-24 DIAGNOSIS — R269 Unspecified abnormalities of gait and mobility: Secondary | ICD-10-CM | POA: Diagnosis not present

## 2018-03-01 ENCOUNTER — Encounter: Payer: Self-pay | Admitting: Podiatry

## 2018-03-01 ENCOUNTER — Ambulatory Visit: Payer: Commercial Managed Care - PPO | Admitting: Podiatry

## 2018-03-01 DIAGNOSIS — M19072 Primary osteoarthritis, left ankle and foot: Secondary | ICD-10-CM | POA: Diagnosis not present

## 2018-03-01 DIAGNOSIS — M14672 Charcot's joint, left ankle and foot: Secondary | ICD-10-CM

## 2018-03-01 DIAGNOSIS — E1143 Type 2 diabetes mellitus with diabetic autonomic (poly)neuropathy: Secondary | ICD-10-CM | POA: Diagnosis not present

## 2018-03-01 DIAGNOSIS — Z9989 Dependence on other enabling machines and devices: Secondary | ICD-10-CM | POA: Diagnosis not present

## 2018-03-01 DIAGNOSIS — M25672 Stiffness of left ankle, not elsewhere classified: Secondary | ICD-10-CM | POA: Diagnosis not present

## 2018-03-01 DIAGNOSIS — R269 Unspecified abnormalities of gait and mobility: Secondary | ICD-10-CM | POA: Diagnosis not present

## 2018-03-01 DIAGNOSIS — M25572 Pain in left ankle and joints of left foot: Secondary | ICD-10-CM | POA: Diagnosis not present

## 2018-03-01 DIAGNOSIS — G8929 Other chronic pain: Secondary | ICD-10-CM

## 2018-03-02 ENCOUNTER — Ambulatory Visit (HOSPITAL_COMMUNITY)
Admission: RE | Admit: 2018-03-02 | Discharge: 2018-03-02 | Disposition: A | Discharge status: Home / Self Care | Payer: Commercial Managed Care - PPO | Source: Ambulatory Visit | Attending: Neurological Surgery | Admitting: Neurological Surgery

## 2018-03-02 DIAGNOSIS — M5416 Radiculopathy, lumbar region: Secondary | ICD-10-CM | POA: Diagnosis not present

## 2018-03-02 DIAGNOSIS — M545 Low back pain: Secondary | ICD-10-CM | POA: Diagnosis not present

## 2018-03-02 DIAGNOSIS — M5126 Other intervertebral disc displacement, lumbar region: Secondary | ICD-10-CM | POA: Insufficient documentation

## 2018-03-03 DIAGNOSIS — R269 Unspecified abnormalities of gait and mobility: Secondary | ICD-10-CM | POA: Diagnosis not present

## 2018-03-03 DIAGNOSIS — M25672 Stiffness of left ankle, not elsewhere classified: Secondary | ICD-10-CM | POA: Diagnosis not present

## 2018-03-03 DIAGNOSIS — Z9989 Dependence on other enabling machines and devices: Secondary | ICD-10-CM | POA: Diagnosis not present

## 2018-03-04 NOTE — Progress Notes (Signed)
   Subjective: 62 year old male with PMHx of T2DM presenting today for follow up evaluation of left foot and ankle pain as well as peripheral neuropathy. He states the pain is relatively unchanged. He reports having an MRI done and is here for the results. There are no modifying factors noted. He has been using the CAM boot as directed. Patient is here for further evaluation and treatment.   Past Medical History:  Diagnosis Date  . Anxiety   . Hypertension      Objective: Physical Exam General: The patient is alert and oriented x3 in no acute distress.  Dermatology: Skin is warm, dry and supple bilateral lower extremities. Negative for open lesions or macerations bilateral.   Vascular: Left ankle warm compared to right. Moderate edema noted. Dorsalis Pedis and Posterior Tibial pulses palpable bilateral.  Capillary fill time is immediate to all digits.  Neurological: Epicritic and protective threshold diminished bilateral.  Severe peripheral lower extremity neuropathy noted bilateral lower extremities.  Musculoskeletal: Tenderness to palpation to the plantar aspect of the left heel along the plantar fascia and the lateral left foot as well as to the lateral left ankle. All other joints range of motion within normal limits bilateral. Strength 5/5 in all groups bilateral.   MRI Impression:  1. Advanced midfoot arthropathy, primarily involving the talonavicular and Chopart joints, progressive from radiographs of 4 months ago. There is fragmentation of the talar head and navicular. These findings are most consistent with Charcot arthropathy. Radiographic follow up recommended. 2. No significant forefoot findings. Mild degenerative changes at the Lisfranc and 1st MTP joints. 3. Generalized forefoot muscular fatty atrophy and mild nonspecific dorsal subcutaneous edema.  Assessment: 1. Charcot neuropathy left midfoot 2. Severe peripheral neuropathy secondary to DM - improved   Plan of Care:    1. Patient evaluated. MRI results reviewed and discussed.  2. Continue weightbearing in CAM boot on the left foot.  3. Continue physical therapy 2 times per week using neurogenix modalities to treat symptoms of peripheral neuropathy 4. Continue Lyrica as per PCP, Dr. Alm Bustard 5. Note today was provided for disability extension for an additional 12 weeks.  6. Return to clinic in 3 months for follow up.   Patient is a pipe fitter.   Edrick Kins, DPM [facility]  Dr. Edrick Kins, DPM    [address], [place] [address]                Office [phone]  Fax [phone]

## 2018-03-08 DIAGNOSIS — Z9989 Dependence on other enabling machines and devices: Secondary | ICD-10-CM | POA: Diagnosis not present

## 2018-03-08 DIAGNOSIS — R269 Unspecified abnormalities of gait and mobility: Secondary | ICD-10-CM | POA: Diagnosis not present

## 2018-03-08 DIAGNOSIS — M25672 Stiffness of left ankle, not elsewhere classified: Secondary | ICD-10-CM | POA: Diagnosis not present

## 2018-03-11 ENCOUNTER — Telehealth: Payer: Self-pay | Admitting: *Deleted

## 2018-03-11 DIAGNOSIS — R269 Unspecified abnormalities of gait and mobility: Secondary | ICD-10-CM | POA: Diagnosis not present

## 2018-03-11 DIAGNOSIS — M14672 Charcot's joint, left ankle and foot: Secondary | ICD-10-CM

## 2018-03-11 DIAGNOSIS — M25672 Stiffness of left ankle, not elsewhere classified: Secondary | ICD-10-CM | POA: Diagnosis not present

## 2018-03-11 DIAGNOSIS — Z9989 Dependence on other enabling machines and devices: Secondary | ICD-10-CM | POA: Diagnosis not present

## 2018-03-11 NOTE — Telephone Encounter (Signed)
Dr. Amalia Hailey was to order Knee Scooter per pt, and he has not been contacted, per Dr. Amalia Hailey assistant. Orders for knee scooter faxed to [facility] and emailed to A. Barnet Glasgow.

## 2018-03-15 DIAGNOSIS — Z9989 Dependence on other enabling machines and devices: Secondary | ICD-10-CM | POA: Diagnosis not present

## 2018-03-15 DIAGNOSIS — M25672 Stiffness of left ankle, not elsewhere classified: Secondary | ICD-10-CM | POA: Diagnosis not present

## 2018-03-15 DIAGNOSIS — R269 Unspecified abnormalities of gait and mobility: Secondary | ICD-10-CM | POA: Diagnosis not present

## 2018-03-22 ENCOUNTER — Other Ambulatory Visit: Payer: Self-pay | Admitting: Family Medicine

## 2018-03-22 DIAGNOSIS — G629 Polyneuropathy, unspecified: Secondary | ICD-10-CM

## 2018-03-22 MED ORDER — HYDROCODONE-ACETAMINOPHEN 7.5-325 MG PO TABS: ORAL_TABLET | ORAL | 0 refills | Status: DC

## 2018-03-22 NOTE — Telephone Encounter (Signed)
Pt needing a refill on:  HYDROcodone-acetaminophen ([place]) 7.5-325 MG tablet  Please fill at:  [place], [place] [phone] (Phone) [phone] (Fax)   Thanks, [place]

## 2018-03-25 DIAGNOSIS — R269 Unspecified abnormalities of gait and mobility: Secondary | ICD-10-CM | POA: Diagnosis not present

## 2018-03-25 DIAGNOSIS — M5416 Radiculopathy, lumbar region: Secondary | ICD-10-CM | POA: Diagnosis not present

## 2018-03-25 DIAGNOSIS — M25672 Stiffness of left ankle, not elsewhere classified: Secondary | ICD-10-CM | POA: Diagnosis not present

## 2018-03-25 DIAGNOSIS — Z9989 Dependence on other enabling machines and devices: Secondary | ICD-10-CM | POA: Diagnosis not present

## 2018-03-26 ENCOUNTER — Encounter: Payer: Self-pay | Admitting: Family Medicine

## 2018-03-26 ENCOUNTER — Ambulatory Visit: Payer: Commercial Managed Care - PPO | Admitting: Family Medicine

## 2018-03-26 ENCOUNTER — Other Ambulatory Visit: Payer: Self-pay | Admitting: Neurological Surgery

## 2018-03-26 VITALS — BP 126/62 | HR 80 | Temp 98.1°F | Resp 16 | Ht 74.0 in | Wt 253.0 lb

## 2018-03-26 DIAGNOSIS — I10 Essential (primary) hypertension: Secondary | ICD-10-CM | POA: Diagnosis not present

## 2018-03-26 DIAGNOSIS — K5900 Constipation, unspecified: Secondary | ICD-10-CM

## 2018-03-26 DIAGNOSIS — Z23 Encounter for immunization: Secondary | ICD-10-CM | POA: Diagnosis not present

## 2018-03-26 DIAGNOSIS — M5416 Radiculopathy, lumbar region: Secondary | ICD-10-CM

## 2018-03-26 DIAGNOSIS — I739 Peripheral vascular disease, unspecified: Secondary | ICD-10-CM

## 2018-03-26 MED ORDER — CILOSTAZOL 50 MG PO TABS: 50.0000 mg | ORAL_TABLET | Freq: Two times a day (BID) | ORAL | 5 refills | Status: DC

## 2018-03-26 NOTE — Progress Notes (Signed)
Patient: Dustin Guerra Male    DOB: 1955/12/02   63 y.o.   MRN: [id number] Visit Date: 03/26/2018  Today's Provider: Lelon Huh, MD   Chief Complaint  Patient presents with  . Peripheral Neuropathy  . Hypertension   Subjective:     HPI    Hypertension, follow-up:  BP Readings from Last 3 Encounters:  03/26/18 126/62  12/09/17 130/80  10/02/17 (!) 156/90    He was last seen for hypertension 4 months ago.  BP at that visit was 130/80. Management since that visit includes Changed Micardis to Ramipril 10mg  daily due to lack of availability of Micardis He reports excellent compliance with treatment. He is not having side effects.  He is not exercising.  Pt will be able to start going back to the gym.  But does have physical therapy once a week.  He is adherent to low salt diet.   Outside blood pressures are 130's/80's. He is experiencing none.  Patient denies chest pain, chest pressure/discomfort, dyspnea, irregular heart beat, lower extremity edema and palpitations.   Cardiovascular risk factors include advanced age (older than 57 for men, 54 for women), hypertension and male gender.  Use of agents associated with hypertension: none.     Weight trend: stable Wt Readings from Last 3 Encounters:  03/26/18 253 lb (114.8 kg)  12/09/17 248 lb (112.5 kg)  10/02/17 234 lb (106.1 kg)    Current diet: in general, a "healthy" diet    ------------------------------------------------------------------------  He also complains of chronic constipation and has been taking OTC docusate twice daily with very little improvement.    No Known Allergies   Current Outpatient Medications:  .  aspirin 81 MG tablet, Take by mouth., Disp: , Rfl:  .  Azelaic Acid (FINACEA) 15 % cream, After skin is thoroughly washed and patted dry, gently but thoroughly massage a thin film of azelaic acid cream into the affected area twice daily, in the morning and evening., Disp: 50 g,  Rfl: 5 .  cilostazol (PLETAL) 100 MG tablet, TAKE ONE TABLET BY MOUTH TWO TIMES A DAY AS NEEDED FOR CIRCULATION, Disp: 60 tablet, Rfl: 12 .  diazepam (VALIUM) 10 MG tablet, TAKE ONE TABLET BY MOUTH DAILY AS NEEDED FOR ANXIETY, Disp: 30 tablet, Rfl: 2 .  docusate sodium (COLACE) 100 MG capsule, Take 100 mg by mouth 2 (two) times daily., Disp: , Rfl:  .  EPINEPHrine (EPIPEN 2-PAK) 0.3 mg/0.3 mL IJ SOAJ injection, as needed. , Disp: , Rfl:  .  HYDROcodone-acetaminophen (NORCO) 7.5-325 MG tablet, 1/2-1 tablet up to four times a day as needed, Disp: 120 tablet, Rfl: 0 .  IRON PO, Take by mouth., Disp: , Rfl:  .  meloxicam (MOBIC) 15 MG tablet, TAKE ONE TABLET BY MOUTH DAILY, Disp: 30 tablet, Rfl: 3 .  Multiple Vitamins-Minerals (CENTRUM SILVER) CHEW, Chew by mouth., Disp: , Rfl:  .  omeprazole (PRILOSEC) 40 MG capsule, Take 1 capsule by mouth daily., Disp: , Rfl:  .  pravastatin (PRAVACHOL) 40 MG tablet, TAKE ONE TABLET BY MOUTH DAILY, Disp: 30 tablet, Rfl: 11 .  pregabalin (LYRICA) 225 MG capsule, Take 1 capsule (225 mg total) by mouth 3 (three) times daily., Disp: 90 capsule, Rfl: 3 .  ramipril (ALTACE) 10 MG capsule, Take 1 capsule (10 mg total) by mouth daily., Disp: 30 capsule, Rfl: 5 .  sildenafil (VIAGRA) 100 MG tablet, TAKE 0.5-1 TABLET BY MOUTH DAILY AS NEEDED FOR ERECTILE DYSFUNCTION, Disp: 5 tablet,  Rfl: 8 .  terbinafine (LAMISIL) 250 MG tablet, TAKE TWO TABLETS BY MOUTH DAILY FOR 7 DAYS EACH MONTH, Disp: 14 tablet, Rfl: 3 .  traMADol (ULTRAM) 50 MG tablet, TAKE ONE TABLET BY MOUTH THREE TIMES A DAY AS NEEDED, Disp: 90 tablet, Rfl: 4 .  methylPREDNISolone (MEDROL) 4 MG tablet, Take as directed, Disp: 21 tablet, Rfl: 0  Review of Systems  Constitutional: Negative.   Respiratory: Negative.   Cardiovascular: Negative.   Gastrointestinal: Positive for constipation. Negative for abdominal distention, abdominal pain, anal bleeding, blood in stool, diarrhea, nausea, rectal pain and vomiting.    Musculoskeletal: Positive for arthralgias, gait problem and myalgias. Negative for back pain, joint swelling, neck pain and neck stiffness.  Neurological: Negative for dizziness, light-headedness and headaches.    Social History   Tobacco Use  . Smoking status: Former Research scientist (life sciences)  . Smokeless tobacco: Never Used  . Tobacco comment: quit 2000, smoked about 1.5 packs for about 5 years  Substance Use Topics  . Alcohol use: No      Objective:   BP 126/62 (BP Location: Right Arm, Patient Position: Sitting, Cuff Size: Large)   Pulse 80   Temp 98.1 F (36.7 C) (Oral)   Resp 16   Ht 6\' 2"  (1.88 m)   Wt 253 lb (114.8 kg)   BMI 32.48 kg/m     Physical Exam   General Appearance:    Alert, cooperative, no distress  Eyes:    PERRL, conjunctiva/corneas clear, EOM's intact       Lungs:     Clear to auscultation bilaterally, respirations unlabored  Heart:    Regular rate and rhythm  Neurologic:   Awake, alert, oriented x 3. No apparent focal neurological           defect.           Assessment & Plan    1. PVD (peripheral vascular disease) (HCC) Stable, refill- cilostazol (PLETAL) 50 MG tablet; Take 1 tablet (50 mg total) by mouth 2 (two) times daily.  Dispense: 60 tablet; Refill: 5  2. Essential hypertension Well controlled.  Continue current medications.    3. Constipation, unspecified constipation type Continue OTC docusate and add fiber product such daily.   4. Need for shingles vaccine Shingrix #2 given today.   Future Appointments  Date Time Provider [place]  08/26/2018 10:00 AM Fisher, Kirstie Peri, MD [facility] None       Lelon Huh, MD  [facility]

## 2018-03-26 NOTE — Patient Instructions (Addendum)
.   Please bring all of your medications to every appointment so we can make sure that our medication list is the same as yours.    Start taking one heaping spoonful of Metamucil every day. If constipation is not better within 2 weeks then call for samples of Movantik

## 2018-03-31 DIAGNOSIS — Z9989 Dependence on other enabling machines and devices: Secondary | ICD-10-CM | POA: Diagnosis not present

## 2018-03-31 DIAGNOSIS — R269 Unspecified abnormalities of gait and mobility: Secondary | ICD-10-CM | POA: Diagnosis not present

## 2018-03-31 DIAGNOSIS — M25672 Stiffness of left ankle, not elsewhere classified: Secondary | ICD-10-CM | POA: Diagnosis not present

## 2018-04-01 ENCOUNTER — Other Ambulatory Visit: Payer: Commercial Managed Care - PPO

## 2018-04-05 ENCOUNTER — Ambulatory Visit
Admission: RE | Admit: 2018-04-05 | Discharge: 2018-04-05 | Disposition: A | Discharge status: Home / Self Care | Payer: Commercial Managed Care - PPO | Source: Ambulatory Visit | Attending: Neurological Surgery | Admitting: Neurological Surgery

## 2018-04-05 DIAGNOSIS — M5127 Other intervertebral disc displacement, lumbosacral region: Secondary | ICD-10-CM | POA: Diagnosis not present

## 2018-04-05 DIAGNOSIS — M5416 Radiculopathy, lumbar region: Secondary | ICD-10-CM

## 2018-04-05 MED ORDER — IOPAMIDOL (ISOVUE-M 200) INJECTION 41%
1.0000 mL | Freq: Once | INTRAMUSCULAR | Status: AC
  Administered 2018-04-05: 1 mL via EPIDURAL

## 2018-04-05 MED ORDER — METHYLPREDNISOLONE ACETATE 40 MG/ML INJ SUSP (RADIOLOG
120.0000 mg | Freq: Once | INTRAMUSCULAR | Status: AC
  Administered 2018-04-05: 120 mg via EPIDURAL

## 2018-04-05 NOTE — Discharge Instructions (Signed)

## 2018-04-07 DIAGNOSIS — Z9989 Dependence on other enabling machines and devices: Secondary | ICD-10-CM | POA: Diagnosis not present

## 2018-04-07 DIAGNOSIS — R269 Unspecified abnormalities of gait and mobility: Secondary | ICD-10-CM | POA: Diagnosis not present

## 2018-04-07 DIAGNOSIS — M25672 Stiffness of left ankle, not elsewhere classified: Secondary | ICD-10-CM | POA: Diagnosis not present

## 2018-04-07 DIAGNOSIS — M14672 Charcot's joint, left ankle and foot: Secondary | ICD-10-CM | POA: Diagnosis not present

## 2018-04-14 DIAGNOSIS — Z9989 Dependence on other enabling machines and devices: Secondary | ICD-10-CM | POA: Diagnosis not present

## 2018-04-14 DIAGNOSIS — M25672 Stiffness of left ankle, not elsewhere classified: Secondary | ICD-10-CM | POA: Diagnosis not present

## 2018-04-14 DIAGNOSIS — R269 Unspecified abnormalities of gait and mobility: Secondary | ICD-10-CM | POA: Diagnosis not present

## 2018-04-21 DIAGNOSIS — M25672 Stiffness of left ankle, not elsewhere classified: Secondary | ICD-10-CM | POA: Diagnosis not present

## 2018-04-21 DIAGNOSIS — Z9989 Dependence on other enabling machines and devices: Secondary | ICD-10-CM | POA: Diagnosis not present

## 2018-04-21 DIAGNOSIS — R269 Unspecified abnormalities of gait and mobility: Secondary | ICD-10-CM | POA: Diagnosis not present

## 2018-04-22 ENCOUNTER — Other Ambulatory Visit: Payer: Self-pay | Admitting: Family Medicine

## 2018-04-22 DIAGNOSIS — G629 Polyneuropathy, unspecified: Secondary | ICD-10-CM

## 2018-04-22 NOTE — Telephone Encounter (Signed)
Pt called saying he needs the hydrocodone 7.5 325 also

## 2018-04-23 MED ORDER — HYDROCODONE-ACETAMINOPHEN 7.5-325 MG PO TABS: ORAL_TABLET | ORAL | 0 refills | Status: DC

## 2018-05-02 ENCOUNTER — Other Ambulatory Visit: Payer: Self-pay | Admitting: Family Medicine

## 2018-05-05 DIAGNOSIS — M25672 Stiffness of left ankle, not elsewhere classified: Secondary | ICD-10-CM | POA: Diagnosis not present

## 2018-05-05 DIAGNOSIS — Z9989 Dependence on other enabling machines and devices: Secondary | ICD-10-CM | POA: Diagnosis not present

## 2018-05-05 DIAGNOSIS — R269 Unspecified abnormalities of gait and mobility: Secondary | ICD-10-CM | POA: Diagnosis not present

## 2018-05-12 DIAGNOSIS — R269 Unspecified abnormalities of gait and mobility: Secondary | ICD-10-CM | POA: Diagnosis not present

## 2018-05-12 DIAGNOSIS — M25672 Stiffness of left ankle, not elsewhere classified: Secondary | ICD-10-CM | POA: Diagnosis not present

## 2018-05-12 DIAGNOSIS — Z9989 Dependence on other enabling machines and devices: Secondary | ICD-10-CM | POA: Diagnosis not present

## 2018-05-14 ENCOUNTER — Other Ambulatory Visit: Payer: Self-pay

## 2018-05-14 ENCOUNTER — Ambulatory Visit: Payer: Commercial Managed Care - PPO | Admitting: Family Medicine

## 2018-05-14 ENCOUNTER — Encounter: Payer: Self-pay | Admitting: Family Medicine

## 2018-05-14 VITALS — BP 130/88 | HR 92 | Temp 98.1°F | Ht 74.0 in | Wt 248.8 lb

## 2018-05-14 DIAGNOSIS — J40 Bronchitis, not specified as acute or chronic: Secondary | ICD-10-CM | POA: Diagnosis not present

## 2018-05-14 DIAGNOSIS — R05 Cough: Secondary | ICD-10-CM

## 2018-05-14 DIAGNOSIS — R059 Cough, unspecified: Secondary | ICD-10-CM

## 2018-05-14 MED ORDER — PREDNISONE 10 MG PO TABS: ORAL_TABLET | ORAL | 0 refills | Status: AC

## 2018-05-14 MED ORDER — AZITHROMYCIN 250 MG PO TABS: ORAL_TABLET | ORAL | 0 refills | Status: AC

## 2018-05-14 NOTE — Patient Instructions (Signed)
.   Please review the attached list of medications and notify my office if there are any errors.   . Please bring all of your medications to every appointment so we can make sure that our medication list is the same as yours.   

## 2018-05-14 NOTE — Progress Notes (Signed)
Patient: Dustin Guerra Male    DOB: [birth year]   63 y.o.   MRN: [id number] Visit Date: 05/14/2018  Today's Provider: Lelon Huh, MD   Chief Complaint  Patient presents with  . Cough    pt exposed to the flu last week 05/04/18  . chest congestion    some brown mucus, wheezing, no fever   Subjective:     Cough  This is a new problem. The current episode started in the past 7 days. The problem has been gradually worsening. The problem occurs constantly. The cough is productive of brown sputum. Associated symptoms include nasal congestion and wheezing (chest congestion). Nothing aggravates the symptoms. He has tried OTC cough suppressant for the symptoms. The treatment provided mild relief.  Denies having any fever or myalgias.   No Known Allergies   Current Outpatient Medications:  .  aspirin 81 MG tablet, Take by mouth., Disp: , Rfl:  .  Azelaic Acid (FINACEA) 15 % cream, After skin is thoroughly washed and patted dry, gently but thoroughly massage a thin film of azelaic acid cream into the affected area twice daily, in the morning and evening., Disp: 50 g, Rfl: 5 .  cilostazol (PLETAL) 50 MG tablet, Take 1 tablet (50 mg total) by mouth 2 (two) times daily., Disp: 60 tablet, Rfl: 5 .  diazepam (VALIUM) 10 MG tablet, TAKE ONE TABLET BY MOUTH DAILY AS NEEDED FOR ANXIETY, Disp: 30 tablet, Rfl: 2 .  docusate sodium (COLACE) 100 MG capsule, Take 100 mg by mouth 2 (two) times daily., Disp: , Rfl:  .  EPINEPHrine (EPIPEN 2-PAK) 0.3 mg/0.3 mL IJ SOAJ injection, as needed. , Disp: , Rfl:  .  HYDROcodone-acetaminophen ([place]CO) 7.5-325 MG tablet, 1/2-1 tablet up to four times a day as needed, Disp: 120 tablet, Rfl: 0 .  IRON PO, Take by mouth., Disp: , Rfl:  .  meloxicam (MOBIC) 15 MG tablet, TAKE ONE TABLET BY MOUTH DAILY, Disp: 30 tablet, Rfl: 5 .  Multiple Vitamins-Minerals (CENTRUM SILVER) CHEW, Chew by mouth., Disp: , Rfl:  .  omeprazole (PRILOSEC) 40 MG capsule, Take 1  capsule by mouth daily., Disp: , Rfl:  .  pravastatin (PRAVACHOL) 40 MG tablet, TAKE ONE TABLET BY MOUTH DAILY, Disp: 30 tablet, Rfl: 11 .  pregabalin (LYRICA) 225 MG capsule, Take 1 capsule (225 mg total) by mouth 3 (three) times daily., Disp: 90 capsule, Rfl: 3 .  ramipril (ALTACE) 10 MG capsule, Take 1 capsule (10 mg total) by mouth daily., Disp: 30 capsule, Rfl: 5 .  sildenafil (VIAGRA) 100 MG tablet, TAKE 0.5-1 TABLET BY MOUTH DAILY AS NEEDED FOR ERECTILE DYSFUNCTION, Disp: 5 tablet, Rfl: 8 .  terbinafine (LAMISIL) 250 MG tablet, TAKE TWO TABLETS BY MOUTH DAILY FOR 7 DAYS EACH MONTH, Disp: 14 tablet, Rfl: 3 .  traMADol (ULTRAM) 50 MG tablet, TAKE ONE TABLET BY MOUTH THREE TIMES A DAY AS NEEDED, Disp: 90 tablet, Rfl: 4  Review of Systems  Constitutional: Negative.   HENT: Positive for congestion (chest congestion).   Eyes: Negative.   Respiratory: Positive for cough and wheezing (chest congestion).   Cardiovascular: Negative.   Gastrointestinal: Negative.   Endocrine: Negative.   Genitourinary: Negative.   Musculoskeletal: Negative.   Skin: Negative.   Allergic/Immunologic: Negative.   Neurological: Negative.   Hematological: Negative.   Psychiatric/Behavioral: Negative.     Social History   Tobacco Use  . Smoking status: Former Research scientist (life sciences)  . Smokeless tobacco: Never Used  .  Tobacco comment: quit 2000, smoked about 1.5 packs for about 5 years  Substance Use Topics  . Alcohol use: No      Objective:   BP 130/88 (BP Location: Right Arm, Patient Position: Sitting, Cuff Size: Normal)   Pulse 92   Temp 98.1 F (36.7 C) (Oral)   Ht 6\' 2"  (1.88 m)   Wt 248 lb 12.8 oz (112.9 kg)   SpO2 94%   BMI 31.94 kg/m  Vitals:   05/14/18 0914  BP: 130/88  Pulse: 92  Temp: 98.1 F (36.7 C)  TempSrc: Oral  SpO2: 94%  Weight: 248 lb 12.8 oz (112.9 kg)  Height: 6\' 2"  (1.88 m)     Physical Exam  General Appearance:    Alert, cooperative, no distress  HENT:   bilateral TM normal  without fluid or infection, neck without nodes and nasal mucosa congested  Eyes:    PERRL, conjunctiva/corneas clear, EOM's intact       Lungs:     Occasional expiratory wheeze, no rales,  respirations unlabored  Heart:    Regular rate and rhythm  Neurologic:   Awake, alert, oriented x 3. No apparent focal neurological           defect.          Assessment & Plan    1. Bronchitis  - azithromycin (ZITHROMAX) 250 MG tablet; 2 by mouth today, then 1 daily for 4 days  Dispense: 6 tablet; Refill: 0 - predniSONE (DELTASONE) 10 MG tablet; 6 tablets for 1 day, then 5 for 1 day, then 4 for 1 day, then 3 for 1 day, then 2 for 1 day then 1 for 1 day.  Dispense: 21 tablet; Refill: 0  2. Cough  - predniSONE (DELTASONE) 10 MG tablet; 6 tablets for 1 day, then 5 for 1 day, then 4 for 1 day, then 3 for 1 day, then 2 for 1 day then 1 for 1 day.  Dispense: 21 tablet; Refill: 0     Lelon Huh, MD  [facility]

## 2018-05-15 ENCOUNTER — Other Ambulatory Visit: Payer: Self-pay | Admitting: Family Medicine

## 2018-05-15 DIAGNOSIS — G629 Polyneuropathy, unspecified: Secondary | ICD-10-CM

## 2018-05-21 ENCOUNTER — Other Ambulatory Visit: Payer: Self-pay | Admitting: Family Medicine

## 2018-05-21 DIAGNOSIS — G629 Polyneuropathy, unspecified: Secondary | ICD-10-CM

## 2018-05-21 MED ORDER — HYDROCODONE-ACETAMINOPHEN 7.5-325 MG PO TABS: ORAL_TABLET | ORAL | 0 refills | Status: DC

## 2018-05-21 NOTE — Telephone Encounter (Signed)
Pt needing a refill on: HYDROcodone-acetaminophen ([place]) 7.5-325 MG tablet  Please fill at:  [place], [place] [phone] (Phone) [phone] (Fax)   Thanks, [place]

## 2018-05-31 ENCOUNTER — Encounter: Payer: Self-pay | Admitting: Podiatry

## 2018-05-31 ENCOUNTER — Ambulatory Visit: Payer: Self-pay | Admitting: Podiatry

## 2018-05-31 ENCOUNTER — Other Ambulatory Visit: Payer: Self-pay

## 2018-05-31 DIAGNOSIS — M779 Enthesopathy, unspecified: Secondary | ICD-10-CM

## 2018-05-31 DIAGNOSIS — M25572 Pain in left ankle and joints of left foot: Secondary | ICD-10-CM

## 2018-05-31 DIAGNOSIS — G8929 Other chronic pain: Secondary | ICD-10-CM

## 2018-05-31 DIAGNOSIS — E1143 Type 2 diabetes mellitus with diabetic autonomic (poly)neuropathy: Secondary | ICD-10-CM

## 2018-05-31 DIAGNOSIS — M14672 Charcot's joint, left ankle and foot: Secondary | ICD-10-CM

## 2018-05-31 DIAGNOSIS — M778 Other enthesopathies, not elsewhere classified: Secondary | ICD-10-CM

## 2018-05-31 NOTE — Progress Notes (Signed)
Subjective: 63 year old male with PMHx of T2DM presenting today for follow up evaluation of left foot and ankle pain as well as peripheral neuropathy.  Patient now complains of right foot pain secondary to compensation.  He reports that he has been very consistent with wearing the immobilization cam boot to the left lower extremity for the past 3 months since last visit on 03/01/2018. Patient describes the right foot pain is a sharp shooting pain during ambulation.  He believes it may be coming from his severe peripheral neuropathy. Patient also states that he lost his medical health insurance on 05/16/2018.  Past Medical History:  Diagnosis Date  . Anxiety   . Hypertension      Objective: Physical Exam General: The patient is alert and oriented x3 in no acute distress.  Dermatology: Skin is warm, dry and supple bilateral lower extremities. Negative for open lesions or macerations bilateral.   Vascular: Left ankle warm compared to right.  Edema appears to be improved to the left lower extremity.. Dorsalis Pedis and Posterior Tibial pulses palpable bilateral.  Capillary fill time is immediate to all digits.  Neurological: Epicritic and protective threshold diminished bilateral.  Severe peripheral lower extremity neuropathy noted bilateral lower extremities.  Musculoskeletal: Tenderness to palpation to the plantar aspect of the left heel along the plantar fascia and the lateral left foot as well as to the lateral left ankle. All other joints range of motion within normal limits bilateral. Strength 5/5 in all groups bilateral.  Pain on palpation also noted to the right first metatarsal cuneiform joint and along first metatarsal.  Radiographic exam: Degenerative changes noted specifically to the talonavicular joint with some fragmentation of the talar head.  Diffuse degenerative changes noted throughout the midfoot and midtarsal joints.  Medial longitudinal arch appears to be somewhat  maintained.  Osseous demineralization noted likely secondary to a disuse osteopenia   MRI Impression 02/10/2018:  1. Advanced midfoot arthropathy, primarily involving the talonavicular and Chopart joints, progressive from radiographs of 4 months ago. There is fragmentation of the talar head and navicular. These findings are most consistent with Charcot arthropathy. Radiographic follow up recommended. 2. No significant forefoot findings. Mild degenerative changes at the Lisfranc and 1st MTP joints. 3. Generalized forefoot muscular fatty atrophy and mild nonspecific dorsal subcutaneous edema.  Assessment: 1. Charcot neuropathy left midfoot 2. Severe peripheral neuropathy secondary to DM - improved   Plan of Care:  1. Patient evaluated.  X-rays reviewed today.  2.  The patient may begin to transition out of the immobilization cam boot into good supportive ankle brace.  Ankle brace was dispensed today 3.  Continue disability for an additional 3 months.  Patient is still unable to work due to the current condition. 4.  Today we will discontinue physical therapy at the actual location.  Patient is going to resume physical therapy at home with home exercises that were provided by the physical therapist 5.  Return to clinic in 3 months for follow-up x-rays and evaluation  Patient is a pipe fitter.   Edrick Kins, DPM [facility]  Dr. Edrick Kins, DPM    [address], [place] [address]                Office [phone]  Fax [phone]

## 2018-06-19 ENCOUNTER — Other Ambulatory Visit: Payer: Self-pay | Admitting: Family Medicine

## 2018-06-19 DIAGNOSIS — G629 Polyneuropathy, unspecified: Secondary | ICD-10-CM

## 2018-06-19 DIAGNOSIS — I10 Essential (primary) hypertension: Secondary | ICD-10-CM

## 2018-06-21 ENCOUNTER — Other Ambulatory Visit: Payer: Self-pay | Admitting: *Deleted

## 2018-06-21 DIAGNOSIS — G629 Polyneuropathy, unspecified: Secondary | ICD-10-CM

## 2018-06-21 MED ORDER — HYDROCODONE-ACETAMINOPHEN 7.5-325 MG PO TABS: ORAL_TABLET | ORAL | 0 refills | Status: DC

## 2018-07-20 ENCOUNTER — Other Ambulatory Visit: Payer: Self-pay | Admitting: Family Medicine

## 2018-07-20 ENCOUNTER — Other Ambulatory Visit: Payer: Self-pay

## 2018-07-20 DIAGNOSIS — I10 Essential (primary) hypertension: Secondary | ICD-10-CM

## 2018-07-20 DIAGNOSIS — G629 Polyneuropathy, unspecified: Secondary | ICD-10-CM

## 2018-07-20 MED ORDER — HYDROCODONE-ACETAMINOPHEN 7.5-325 MG PO TABS: ORAL_TABLET | ORAL | 0 refills | Status: DC

## 2018-07-20 NOTE — Telephone Encounter (Signed)
Patient requesting medication refill on the following medication. HYDROcodone-acetaminophen ([place]) 7.5-325 MG tablet [  [facility]arm[facility]: [facility]

## 2018-08-19 ENCOUNTER — Other Ambulatory Visit: Payer: Self-pay | Admitting: Family Medicine

## 2018-08-19 ENCOUNTER — Other Ambulatory Visit: Payer: Self-pay

## 2018-08-19 DIAGNOSIS — G629 Polyneuropathy, unspecified: Secondary | ICD-10-CM

## 2018-08-20 MED ORDER — HYDROCODONE-ACETAMINOPHEN 7.5-325 MG PO TABS: ORAL_TABLET | ORAL | 0 refills | Status: DC

## 2018-08-23 ENCOUNTER — Other Ambulatory Visit: Payer: Self-pay

## 2018-08-23 ENCOUNTER — Ambulatory Visit (INDEPENDENT_AMBULATORY_CARE_PROVIDER_SITE_OTHER): Payer: Self-pay | Admitting: Podiatry

## 2018-08-23 ENCOUNTER — Ambulatory Visit (INDEPENDENT_AMBULATORY_CARE_PROVIDER_SITE_OTHER): Payer: Self-pay

## 2018-08-23 DIAGNOSIS — M14672 Charcot's joint, left ankle and foot: Secondary | ICD-10-CM

## 2018-08-23 DIAGNOSIS — M25572 Pain in left ankle and joints of left foot: Secondary | ICD-10-CM

## 2018-08-23 DIAGNOSIS — G8929 Other chronic pain: Secondary | ICD-10-CM

## 2018-08-26 ENCOUNTER — Encounter: Payer: Commercial Managed Care - PPO | Admitting: Family Medicine

## 2018-08-26 NOTE — Progress Notes (Signed)
   Subjective: 63 year old male with PMHx of T2DM presenting today for follow up evaluation Charcot neuropathy of the left midfoot and severe peripheral neuropathy secondary to DM. He states he is doing well. He does reports that his right great toenail seems to be lifting from the nail bed. He has not done anything for treatment. He has been taking Lyrica for his neuropathy. There are no modifying factors noted. Patient is here for further evaluation and treatment.   Past Medical History:  Diagnosis Date  . Anxiety   . Hypertension      Objective: Physical Exam General: The patient is alert and oriented x3 in no acute distress.  Dermatology: Skin is warm, dry and supple bilateral lower extremities. Negative for open lesions or macerations bilateral.   Vascular: Left ankle warm compared to right.  Edema appears to be improved to the left lower extremity.. Dorsalis Pedis and Posterior Tibial pulses palpable bilateral.  Capillary fill time is immediate to all digits.  Neurological: Epicritic and protective threshold diminished bilateral.  Severe peripheral lower extremity neuropathy noted bilateral lower extremities.  Musculoskeletal: Tenderness to palpation to the plantar aspect of the left heel along the plantar fascia and the lateral left foot as well as to the lateral left ankle. All other joints range of motion within normal limits bilateral. Strength 5/5 in all groups bilateral.  Pain on palpation also noted to the right first metatarsal cuneiform joint and along first metatarsal.  Assessment: 1. Charcot neuropathy left midfoot - stable 2. Severe peripheral neuropathy secondary to DM - improved   Plan of Care:  1. Patient evaluated. X-rays declined.  2. Continue weightbearing in ankle brace with good sneakers.  3. Continue taking Lyrica BID.  4. Return to clinic in 6 months for follow up X-Ray of left foot.   Patient is a pipe fitter.   Edrick Kins, DPM [facility]  Dr. Edrick Kins, DPM    [address], [place] [address]                Office [phone]  Fax [phone]

## 2018-08-30 ENCOUNTER — Other Ambulatory Visit: Payer: Self-pay | Admitting: Family Medicine

## 2018-09-03 ENCOUNTER — Encounter: Payer: Self-pay | Admitting: Family Medicine

## 2018-09-03 ENCOUNTER — Other Ambulatory Visit: Payer: Self-pay

## 2018-09-03 ENCOUNTER — Ambulatory Visit (INDEPENDENT_AMBULATORY_CARE_PROVIDER_SITE_OTHER): Payer: Self-pay | Admitting: Family Medicine

## 2018-09-03 VITALS — BP 136/68 | HR 78 | Temp 98.6°F | Resp 16 | Ht 74.0 in | Wt 248.0 lb

## 2018-09-03 DIAGNOSIS — F1190 Opioid use, unspecified, uncomplicated: Secondary | ICD-10-CM | POA: Insufficient documentation

## 2018-09-03 DIAGNOSIS — M14672 Charcot's joint, left ankle and foot: Secondary | ICD-10-CM

## 2018-09-03 DIAGNOSIS — R7303 Prediabetes: Secondary | ICD-10-CM

## 2018-09-03 DIAGNOSIS — I10 Essential (primary) hypertension: Secondary | ICD-10-CM

## 2018-09-03 NOTE — Patient Instructions (Signed)
.   Please review the attached list of medications and notify my office if there are any errors.   . Please bring all of your medications to every appointment so we can make sure that our medication list is the same as yours.   

## 2018-09-03 NOTE — Progress Notes (Signed)
Marland Kitchenprobl  Patient: Dustin Guerra Male    DOB: 04-05-1955   63 y.o.   MRN: [id number] Visit Date: 09/03/2018  Today's Provider: Lelon Huh, MD   Chief Complaint  Patient presents with  . Hypertension  . Hyperglycemia  . PVD   Subjective:    HPI  Hypertension, follow-up:  BP Readings from Last 3 Encounters:  09/03/18 136/68  05/14/18 130/88  04/05/18 (!) 159/88    He was last seen for hypertension 6 months ago.  BP at that visit was 126/62. Management since that visit includes no changes. He reports good compliance with treatment. He is not having side effects.  He is exercising. He is adherent to low salt diet.   Outside blood pressures are checked occasionally . He is experiencing none.  Patient denies exertional chest pressure/discomfort, lower extremity edema and palpitations.    Weight trend: stable Wt Readings from Last 3 Encounters:  09/03/18 248 lb (112.5 kg)  05/14/18 248 lb 12.8 oz (112.9 kg)  03/26/18 253 lb (114.8 kg)    Current diet: well balanced    Prediabetes, Follow-up:   Lab Results  Component Value Date   HGBA1C 5.7 05/11/2017   HGBA1C 6.2 11/20/2016   HGBA1C 6.0 06/06/2016   GLUCOSE 101 (H) 08/24/2017   GLUCOSE 108 (H) 07/17/2017   GLUCOSE 142 (H) 11/20/2016    Last seen for for this6 months ago.  Management since that visit includes no changes. Current symptoms include none and have been stable.  Weight trend: stable Prior visit with dietician: no Current diet: well balanced Current exercise: no regular exercise  Pertinent Labs:    Component Value Date/Time   CHOL 114 08/24/2017 1507   CHOL 85 06/04/2012 0610   TRIG 96 08/24/2017 1507   TRIG 109 06/04/2012 0610   CHOLHDL 3.5 08/24/2017 1507   CREATININE 1.25 08/24/2017 1507   CREATININE 0.90 11/20/2016 1153    Wt Readings from Last 3 Encounters:  09/03/18 248 lb (112.5 kg)  05/14/18 248 lb 12.8 oz (112.9 kg)  03/26/18 253 lb (114.8 kg)   PVD, follow  up: No changes since last visit. Continue to daily pain for PVD, neuropathy, and arthritis. Is doing well on current pain medication regiment, when he has been on consistently for several years. Is seeing Dr. Amalia Hailey for Charcot foot and was having to take a few more pain pills, but is doing better now.   No Known Allergies   Current Outpatient Medications:  .  aspirin 81 MG tablet, Take by mouth., Disp: , Rfl:  .  Azelaic Acid (FINACEA) 15 % cream, After skin is thoroughly washed and patted dry, gently but thoroughly massage a thin film of azelaic acid cream into the affected area twice daily, in the morning and evening., Disp: 50 g, Rfl: 5 .  cilostazol (PLETAL) 50 MG tablet, Take 1 tablet (50 mg total) by mouth 2 (two) times daily., Disp: 60 tablet, Rfl: 5 .  diazepam (VALIUM) 10 MG tablet, TAKE ONE TABLET BY MOUTH DAILY AS NEEDED FOR ANXIETY, Disp: 30 tablet, Rfl: 1 .  docusate sodium (COLACE) 100 MG capsule, Take 100 mg by mouth 2 (two) times daily., Disp: , Rfl:  .  EPINEPHrine (EPIPEN 2-PAK) 0.3 mg/0.3 mL IJ SOAJ injection, as needed. , Disp: , Rfl:  .  HYDROcodone-acetaminophen (NORCO) 7.5-325 MG tablet, 1/2-1 tablet up to four times a day as needed, Disp: 120 tablet, Rfl: 0 .  IRON PO, Take by mouth., Disp: ,  Rfl:  .  meloxicam (MOBIC) 15 MG tablet, TAKE ONE TABLET BY MOUTH DAILY, Disp: 30 tablet, Rfl: 5 .  Multiple Vitamins-Minerals (CENTRUM SILVER) CHEW, Chew by mouth., Disp: , Rfl:  .  pravastatin (PRAVACHOL) 40 MG tablet, TAKE ONE TABLET BY MOUTH DAILY, Disp: 30 tablet, Rfl: 11 .  pregabalin (LYRICA) 225 MG capsule, TAKE ONE CAPSULE BY MOUTH THREE TIMES A DAY, Disp: 90 capsule, Rfl: 2 .  ramipril (ALTACE) 10 MG capsule, TAKE ONE CAPSULE BY MOUTH DAILY, Disp: 30 capsule, Rfl: 12 .  sildenafil (VIAGRA) 100 MG tablet, TAKE 0.5-1 TABLET BY MOUTH DAILY AS NEEDED FOR ERECTILE DYSFUNCTION, Disp: 5 tablet, Rfl: 8 .  terbinafine (LAMISIL) 250 MG tablet, TAKE TWO TABLETS DAILY FOR 7 DAYS EACH  MONTH, Disp: 14 tablet, Rfl: 3 .  traMADol (ULTRAM) 50 MG tablet, TAKE ONE TABLET BY MOUTH THREE TIMES A DAY AS NEEDED, Disp: 90 tablet, Rfl: 3 .  omeprazole (PRILOSEC) 40 MG capsule, Take 1 capsule by mouth daily., Disp: , Rfl:   Review of Systems  Constitutional: Negative for activity change, appetite change, chills, diaphoresis, fatigue and fever.  Respiratory: Negative for cough and shortness of breath.   Cardiovascular: Negative for chest pain, palpitations and leg swelling.  Endocrine: Negative for cold intolerance, heat intolerance, polydipsia, polyphagia and polyuria.  Musculoskeletal: Positive for arthralgias and myalgias.  Skin: Negative for color change, pallor, rash and wound.  Neurological: Negative for dizziness and headaches.  Psychiatric/Behavioral: Negative for agitation, self-injury, sleep disturbance and suicidal ideas. The patient is not nervous/anxious.     Social History   Tobacco Use  . Smoking status: Former Research scientist (life sciences)  . Smokeless tobacco: Never Used  . Tobacco comment: quit 2000, smoked about 1.5 packs for about 5 years  Substance Use Topics  . Alcohol use: No      Objective:   BP 136/68 (BP Location: Right Arm, Patient Position: Sitting, Cuff Size: Large)   Pulse 78   Temp 98.6 F (37 C)   Resp 16   Ht 6\' 2"  (1.88 m)   Wt 248 lb (112.5 kg)   SpO2 98%   BMI 31.84 kg/m  Vitals:   09/03/18 1000  BP: 136/68  Pulse: 78  Resp: 16  Temp: 98.6 F (37 C)  SpO2: 98%  Weight: 248 lb (112.5 kg)  Height: 6\' 2"  (1.88 m)     Physical Exam    General Appearance:    Alert, cooperative, no distress  Eyes:    PERRL, conjunctiva/corneas clear, EOM's intact       Lungs:     Clear to auscultation bilaterally, respirations unlabored  Heart:    Regular rate and rhythm  Neurologic:   Awake, alert, oriented x 3. No apparent focal neurological           defect.       Lab Results  Component Value Date   HGBA1C 6.6 (H) 09/03/2018       Assessment & Plan      1. Chronic, continuous use of opioids  - Drug Screen, Urine  2. Essential hypertension Well controlled.  Continue current medications.   - Comprehensive metabolic panel - Lipid panel  3. Pre-diabetes  - Hemoglobin A1c  4. Charcot's joint of left foot Much improved, continue regular follow up with Dr. Amalia Hailey.   He anticipates getting health insurance later this year and deferred any HM services until then.   The entirety of the information documented in the History of Present Illness, Review of  Systems and Physical Exam were personally obtained by me. Portions of this information were initially documented by Dustin Guerra, CMA and reviewed by me for thoroughness and accuracy.      Lelon Huh, MD  [facility]

## 2018-09-04 LAB — LIPID PANEL
Chol/HDL Ratio: 3.7 ratio (ref 0.0–5.0)
Cholesterol, Total: 108 mg/dL (ref 100–199)
HDL: 29 mg/dL — ABNORMAL LOW (ref >39)
LDL Calculated: 56 mg/dL (ref 0–99)
Triglycerides: 116 mg/dL (ref 0–149)
VLDL Cholesterol Cal: 23 mg/dL (ref 5–40)

## 2018-09-04 LAB — COMPREHENSIVE METABOLIC PANEL
ALT: 19 IU/L (ref 0–44)
AST: 20 IU/L (ref 0–40)
Albumin/Globulin Ratio: 2.4 — ABNORMAL HIGH (ref 1.2–2.2)
Albumin: 4.8 g/dL (ref 3.8–4.9)
Alkaline Phosphatase: 104 IU/L (ref 39–117)
BUN/Creatinine Ratio: 21 (ref 10–24)
BUN: 22 mg/dL (ref 8–27)
Bilirubin Total: 0.2 mg/dL (ref 0.0–1.2)
CO2: 23 mmol/L (ref 20–29)
Calcium: 9.6 mg/dL (ref 8.6–10.2)
Chloride: 102 mmol/L (ref 96–106)
Creatinine, Ser: 1.05 mg/dL (ref 0.76–1.27)
GFR calc Af Amer: 89 mL/min/{1.73_m2} (ref >59)
GFR calc non Af Amer: 77 mL/min/{1.73_m2} (ref >59)
Globulin, Total: 2 g/dL (ref 1.5–4.5)
Glucose: 120 mg/dL — ABNORMAL HIGH (ref 65–99)
Potassium: 4.9 mmol/L (ref 3.5–5.2)
Sodium: 139 mmol/L (ref 134–144)
Total Protein: 6.8 g/dL (ref 6.0–8.5)

## 2018-09-04 LAB — HEMOGLOBIN A1C
Est. average glucose Bld gHb Est-mCnc: 143 mg/dL
Hgb A1c MFr Bld: 6.6 % — ABNORMAL HIGH (ref 4.8–5.6)

## 2018-09-06 ENCOUNTER — Telehealth: Payer: Self-pay

## 2018-09-06 LAB — DRUG SCREEN, URINE
Amphetamines, Urine: NEGATIVE ng/mL
Barbiturate screen, urine: NEGATIVE ng/mL
Benzodiazepine Quant, Ur: POSITIVE ng/mL — AB
Cannabinoid Quant, Ur: NEGATIVE ng/mL
Cocaine (Metab.): NEGATIVE ng/mL
Opiate Quant, Ur: POSITIVE ng/mL — AB
PCP Quant, Ur: NEGATIVE ng/mL

## 2018-09-06 NOTE — Telephone Encounter (Signed)
Pt advised.   Thanks,   -Nakai Pollio  

## 2018-09-06 NOTE — Telephone Encounter (Signed)
-----   Message from Birdie Sons, MD sent at 09/05/2018  2:51 PM EDT ----- a1c Is up to 6.6. try to be strict with diet. Otherwise labs are normal. Cholesterol well controlled at 108. Continue current medications.

## 2018-09-20 ENCOUNTER — Other Ambulatory Visit: Payer: Self-pay | Admitting: Family Medicine

## 2018-09-20 DIAGNOSIS — G629 Polyneuropathy, unspecified: Secondary | ICD-10-CM

## 2018-09-21 MED ORDER — HYDROCODONE-ACETAMINOPHEN 7.5-325 MG PO TABS: ORAL_TABLET | ORAL | 0 refills | Status: DC

## 2018-09-21 NOTE — Telephone Encounter (Signed)
Pt needs a refill on his   Hydrocodone 7.5-325  [employer]

## 2018-09-23 NOTE — Addendum Note (Signed)
Addended by: Birdie Sons on: 09/23/2018 01:28 PM   Modules accepted: Level of Service

## 2018-10-21 ENCOUNTER — Other Ambulatory Visit: Payer: Self-pay | Admitting: Family Medicine

## 2018-10-21 DIAGNOSIS — G629 Polyneuropathy, unspecified: Secondary | ICD-10-CM

## 2018-10-21 NOTE — Telephone Encounter (Signed)
Pt contacted office for refill request on the following medications:  HYDROcodone-acetaminophen ([place]) 7.5-325 MG tablet  [employer] LOV: 09/03/2018 Please advise. Thanks TNP

## 2018-10-22 MED ORDER — HYDROCODONE-ACETAMINOPHEN 7.5-325 MG PO TABS: ORAL_TABLET | ORAL | 0 refills | Status: DC

## 2018-10-25 ENCOUNTER — Encounter: Payer: Commercial Managed Care - PPO | Admitting: Family Medicine

## 2018-11-08 ENCOUNTER — Other Ambulatory Visit: Payer: Self-pay | Admitting: Family Medicine

## 2018-11-08 MED ORDER — OMEPRAZOLE 40 MG PO CPDR: 40.0000 mg | DELAYED_RELEASE_CAPSULE | Freq: Every day | ORAL | 4 refills | Status: DC

## 2018-11-08 NOTE — Telephone Encounter (Signed)
Pt needing a refill on:  omeprazole (PRILOSEC) 40 MG capsule  Please fill at:  [place], [place] [phone] (Phone) [phone] (Fax)     Thanks, [place]

## 2018-11-20 ENCOUNTER — Other Ambulatory Visit: Payer: Self-pay | Admitting: Family Medicine

## 2018-11-20 DIAGNOSIS — I739 Peripheral vascular disease, unspecified: Secondary | ICD-10-CM

## 2018-11-23 ENCOUNTER — Other Ambulatory Visit: Payer: Self-pay

## 2018-11-23 DIAGNOSIS — G629 Polyneuropathy, unspecified: Secondary | ICD-10-CM

## 2018-11-23 MED ORDER — HYDROCODONE-ACETAMINOPHEN 7.5-325 MG PO TABS: ORAL_TABLET | ORAL | 0 refills | Status: DC

## 2018-11-26 ENCOUNTER — Other Ambulatory Visit: Payer: Self-pay | Admitting: Family Medicine

## 2018-11-26 DIAGNOSIS — G629 Polyneuropathy, unspecified: Secondary | ICD-10-CM

## 2018-12-20 ENCOUNTER — Other Ambulatory Visit: Payer: Self-pay | Admitting: Family Medicine

## 2018-12-20 DIAGNOSIS — G629 Polyneuropathy, unspecified: Secondary | ICD-10-CM

## 2018-12-20 NOTE — Telephone Encounter (Signed)
Pt needing a refill on:  HYDROcodone-acetaminophen ([place]) 7.5-325 MG tablet  Please fill at: [place], [place] [phone] (Phone) [phone] (Fax)   Thanks, [place]

## 2018-12-21 MED ORDER — HYDROCODONE-ACETAMINOPHEN 7.5-325 MG PO TABS: ORAL_TABLET | ORAL | 0 refills | Status: DC

## 2018-12-28 ENCOUNTER — Other Ambulatory Visit: Payer: Self-pay | Admitting: Family Medicine

## 2018-12-29 ENCOUNTER — Other Ambulatory Visit: Payer: Self-pay | Admitting: Family Medicine

## 2018-12-29 DIAGNOSIS — G629 Polyneuropathy, unspecified: Secondary | ICD-10-CM

## 2019-01-19 ENCOUNTER — Other Ambulatory Visit: Payer: Self-pay | Admitting: Family Medicine

## 2019-01-24 ENCOUNTER — Other Ambulatory Visit: Payer: Self-pay | Admitting: Family Medicine

## 2019-01-24 DIAGNOSIS — G629 Polyneuropathy, unspecified: Secondary | ICD-10-CM

## 2019-01-24 MED ORDER — HYDROCODONE-ACETAMINOPHEN 7.5-325 MG PO TABS: ORAL_TABLET | ORAL | 0 refills | Status: DC

## 2019-01-24 NOTE — Telephone Encounter (Signed)
Last OV 09/03/2018 and last RF 12/21/2018

## 2019-01-24 NOTE — Telephone Encounter (Signed)
Pt needing a refill on: HYDROcodone-acetaminophen ([place]) 7.5-325 MG tablet  Please fill at: [place], [place] [phone] (Phone) [phone] (Fax)   Thanks, [place]

## 2019-02-16 ENCOUNTER — Telehealth: Payer: Self-pay

## 2019-02-16 NOTE — Telephone Encounter (Signed)
Copied from [place] [id number]. Topic: General - Other >> Feb 16, 2019  1:51 PM Antonieta Iba C wrote: Reason for CRM: [place] calling with [employer], she is calling in to follow up on assistance program form that was faxed to office.. it is in regards to pt pregabalin (LYRICA) 225 MG capsule.    Also requesting a Rx for Azelaic Acid (FINACEA) 15 % cream  sent to fax [phone]     CB: [phone] direct #

## 2019-02-21 MED ORDER — AZELAIC ACID 15 % EX GEL: CUTANEOUS | 5 refills | Status: DC

## 2019-02-21 NOTE — Telephone Encounter (Signed)
Patient states he did request patient assistance for Lyrica and also refill on Finacea.

## 2019-02-23 ENCOUNTER — Other Ambulatory Visit: Payer: Self-pay

## 2019-02-23 ENCOUNTER — Ambulatory Visit (INDEPENDENT_AMBULATORY_CARE_PROVIDER_SITE_OTHER): Payer: No Typology Code available for payment source

## 2019-02-23 ENCOUNTER — Ambulatory Visit: Payer: No Typology Code available for payment source | Admitting: Podiatry

## 2019-02-23 DIAGNOSIS — M79672 Pain in left foot: Secondary | ICD-10-CM

## 2019-02-23 DIAGNOSIS — M14672 Charcot's joint, left ankle and foot: Secondary | ICD-10-CM

## 2019-02-23 DIAGNOSIS — E1143 Type 2 diabetes mellitus with diabetic autonomic (poly)neuropathy: Secondary | ICD-10-CM | POA: Diagnosis not present

## 2019-02-25 ENCOUNTER — Other Ambulatory Visit: Payer: Self-pay | Admitting: Family Medicine

## 2019-02-25 DIAGNOSIS — G629 Polyneuropathy, unspecified: Secondary | ICD-10-CM

## 2019-02-25 NOTE — Telephone Encounter (Signed)
Medication Refill - Medication: HYDROcodone-acetaminophen (NORCO) 7.5-325 MG tablet [id number]     Preferred Pharmacy (with phone number or street name):  [place], [place] [facility]  [place] [address]  Phone: [phone] Fax: [phone]     Agent: Please be advised that RX refills may take up to 3 business days. We ask that you follow-up with your pharmacy.

## 2019-02-25 NOTE — Telephone Encounter (Signed)
[place] called to f/up on paperwork for patient assistance program. Please call [phone] ext [phone]

## 2019-02-25 NOTE — Telephone Encounter (Signed)
Requested medication (s) are due for refill today: yes  Requested medication (s) are on the active medication list: yes  Last refill: 01/24/2019  Future visit scheduled: yes  Notes to clinic:  refill cannot be delegated    Requested Prescriptions  Pending Prescriptions Disp Refills   HYDROcodone-acetaminophen ([place]) 7.5-325 MG tablet 120 tablet 0    Sig: 1/2-1 tablet up to four times a day as needed      Not Delegated - Analgesics:  Opioid Agonist Combinations Failed - 02/25/2019  1:09 PM      Failed - This refill cannot be delegated      Failed - Urine Drug Screen completed in last 360 days.      Passed - Valid encounter within last 6 months    Recent Outpatient Visits           5 months ago Essential hypertension   [facility] Birdie Sons, MD   9 months ago [place], Donald E, MD   11 months ago PVD (peripheral vascular disease) [facility])   [facility] Birdie Sons, MD   1 year ago Neuropathy   [facility] Birdie Sons, MD   1 year ago Foot pain, bilateral   [place], MD       Future Appointments             In 5 days [place], Kirstie Peri, MD [facility], [place]

## 2019-02-26 NOTE — Progress Notes (Signed)
   Subjective: 63 year old male with PMHx of T2DM presenting today for follow up evaluation Charcot neuropathy of the left midfoot and severe peripheral neuropathy secondary to DM. He states he is doing well. He reports therapy and using the tri-lock brace has helped vastly improve his symptoms. He has been taking Lyrica as directed as well. He denies any new concerns or complaints. Patient is here for further evaluation and treatment.   Past Medical History:  Diagnosis Date  . Anxiety   . Hypertension   . Plantar fasciitis 08/04/2015     Objective: Physical Exam General: The patient is alert and oriented x3 in no acute distress.  Dermatology: Skin is warm, dry and supple bilateral lower extremities. Negative for open lesions or macerations bilateral.   Vascular: Left ankle warm compared to right.  Edema appears to be improved to the left lower extremity.. Dorsalis Pedis and Posterior Tibial pulses palpable bilateral.  Capillary fill time is immediate to all digits.  Neurological: Epicritic and protective threshold diminished bilateral.  Severe peripheral lower extremity neuropathy noted bilateral lower extremities.  Musculoskeletal: Tenderness to palpation to the plantar aspect of the left heel along the plantar fascia and the lateral left foot as well as to the lateral left ankle. All other joints range of motion within normal limits bilateral. Strength 5/5 in all groups bilateral.  Pain on palpation also noted to the right first metatarsal cuneiform joint and along first metatarsal.  Radiographic exam: Degenerative changes noted specifically to the talonavicular joint with some fragmentation of the talar head.  Diffuse degenerative changes noted throughout the midfoot and midtarsal joints.  Medial longitudinal arch appears to be somewhat maintained.  Osseous demineralization noted likely secondary to a disuse osteopenia  Assessment: 1. Charcot neuropathy left midfoot - stable 2.  Severe peripheral neuropathy secondary to DM - improved   Plan of Care:  1. Patient evaluated. X-rays reviewed.  2. Continue using OTC ankle brace.  3. Continue taking Lyrica BID.  4. Recommended good shoe gear.  5. Return to clinic annually.   Patient is a pipe fitter.   Edrick Kins, DPM [facility]  Dr. Edrick Kins, DPM    [address], [place] [address]                Office [phone]  Fax [phone]

## 2019-02-28 ENCOUNTER — Other Ambulatory Visit: Payer: Self-pay | Admitting: Podiatry

## 2019-02-28 DIAGNOSIS — M14672 Charcot's joint, left ankle and foot: Secondary | ICD-10-CM

## 2019-02-28 MED ORDER — HYDROCODONE-ACETAMINOPHEN 7.5-325 MG PO TABS: ORAL_TABLET | ORAL | 0 refills | Status: DC

## 2019-03-01 ENCOUNTER — Other Ambulatory Visit: Payer: Self-pay | Admitting: Family Medicine

## 2019-03-01 DIAGNOSIS — G629 Polyneuropathy, unspecified: Secondary | ICD-10-CM

## 2019-03-01 NOTE — Telephone Encounter (Signed)
Requested medication (s) are due for refill today: yes  Requested medication (s) are on the active medication list:yes  Last refill:  01/24/2019  Future visit scheduled: yes  Notes to clinic:  refill cannot be delegated    Requested Prescriptions  Pending Prescriptions Disp Refills   traMADol (ULTRAM) 50 MG tablet [Pharmacy Med Name: traMADol HCL 50MG  TABLET] 90 tablet 0    Sig: TAKE ONE TABLET BY MOUTH THREE TIMES A DAY AS NEEDED      Not Delegated - Analgesics:  Opioid Agonists Failed - 03/01/2019  7:06 AM      Failed - This refill cannot be delegated      Failed - Urine Drug Screen completed in last 360 days.      Passed - Valid encounter within last 6 months    Recent Outpatient Visits           5 months ago Essential hypertension   [facility] Birdie Sons, MD   9 months ago [place], Donald E, MD   11 months ago PVD (peripheral vascular disease) [facility])   [facility] Birdie Sons, MD   1 year ago Neuropathy   [facility] Birdie Sons, MD   1 year ago Foot pain, bilateral   [place], Kirstie Peri, MD       Future Appointments             Tomorrow Caryn Section Kirstie Peri, MD [facility], [place]

## 2019-03-02 ENCOUNTER — Ambulatory Visit (INDEPENDENT_AMBULATORY_CARE_PROVIDER_SITE_OTHER): Payer: Self-pay | Admitting: Family Medicine

## 2019-03-02 ENCOUNTER — Encounter: Payer: Self-pay | Admitting: Family Medicine

## 2019-03-02 ENCOUNTER — Other Ambulatory Visit: Payer: Self-pay

## 2019-03-02 VITALS — BP 142/80 | HR 70 | Temp 96.8°F | Resp 16 | Wt 241.0 lb

## 2019-03-02 DIAGNOSIS — F3342 Major depressive disorder, recurrent, in full remission: Secondary | ICD-10-CM | POA: Insufficient documentation

## 2019-03-02 DIAGNOSIS — K219 Gastro-esophageal reflux disease without esophagitis: Secondary | ICD-10-CM | POA: Diagnosis not present

## 2019-03-02 DIAGNOSIS — R7303 Prediabetes: Secondary | ICD-10-CM

## 2019-03-02 DIAGNOSIS — I10 Essential (primary) hypertension: Secondary | ICD-10-CM | POA: Diagnosis not present

## 2019-03-02 DIAGNOSIS — K439 Ventral hernia without obstruction or gangrene: Secondary | ICD-10-CM | POA: Diagnosis not present

## 2019-03-02 LAB — POCT GLYCOSYLATED HEMOGLOBIN (HGB A1C)
Est. average glucose Bld gHb Est-mCnc: 123
Hemoglobin A1C: 5.9 % — AB (ref 4.0–5.6)

## 2019-03-02 MED ORDER — LANSOPRAZOLE 30 MG PO CPDR: 30.0000 mg | DELAYED_RELEASE_CAPSULE | Freq: Every day | ORAL | 3 refills | Status: DC

## 2019-03-02 NOTE — Progress Notes (Signed)
Patient: Dustin Guerra Male    DOB: May 15, 1955   63 y.o.   MRN: [id number] Visit Date: 03/02/2019  Today's Provider: Lelon Huh, MD   Chief Complaint  Patient presents with  . Hyperglycemia  . Hypertension  . Peripheral Neuropathy   Subjective:     HPI  Hypertension, follow-up:  BP Readings from Last 3 Encounters:  03/02/19 (!) 142/80  09/03/18 136/68  05/14/18 130/88    He was last seen for hypertension 6 months ago.  BP at that visit was 136/68. Management since that visit includes no changes. He reports good compliance with treatment. He is not having side effects.  He is exercising. He is not adherent to low salt diet.   Outside blood pressures are not being checked. He is experiencing none.  Patient denies chest pain, chest pressure/discomfort, claudication, dyspnea, exertional chest pressure/discomfort, fatigue, irregular heart beat, lower extremity edema, near-syncope, orthopnea, palpitations, paroxysmal nocturnal dyspnea, syncope and tachypnea.   Cardiovascular risk factors include advanced age (older than 3 for men, 66 for women), hypertension and male gender.  Use of agents associated with hypertension: NSAIDS.     Weight trend: fluctuating a bit Wt Readings from Last 3 Encounters:  03/02/19 241 lb (109.3 kg)  09/03/18 248 lb (112.5 kg)  05/14/18 248 lb 12.8 oz (112.9 kg)    Current diet: well balanced  ------------------------------------------------------------------------  Prediabetes, Follow-up:   Lab Results  Component Value Date   HGBA1C 5.9 (A) 03/02/2019   HGBA1C 6.6 (H) 09/03/2018   HGBA1C 5.7 05/11/2017   GLUCOSE 120 (H) 09/03/2018   GLUCOSE 101 (H) 08/24/2017   GLUCOSE 108 (H) 07/17/2017    Last seen for for this6 months ago.  Management since that visit includes advising patient to get strict with diet. Current symptoms include none and have been stable.  Weight trend: stable Prior visit with dietician:  no Current diet: well balanced Current exercise: weightlifting  Pertinent Labs:    Component Value Date/Time   CHOL 108 09/03/2018 1133   CHOL 85 06/04/2012 0610   TRIG 116 09/03/2018 1133   TRIG 109 06/04/2012 0610   CHOLHDL 3.7 09/03/2018 1133   CREATININE 1.05 09/03/2018 1133   CREATININE 0.90 11/20/2016 1153    Wt Readings from Last 3 Encounters:  03/02/19 241 lb (109.3 kg)  09/03/18 248 lb (112.5 kg)  05/14/18 248 lb 12.8 oz (112.9 kg)    Follow up for Chronic pain:  The patient was last seen for this 6 months ago. Changes made at last visit include none.  He reports good compliance with treatment. He feels that condition is Unchanged. He is not having side effects.  Also on Lyrica for neuropathy which he states is working well without adverse effects. ------------------------------------------------------------------------------------  He also reports a bulge in upper abdomen next to old surgical scar that he is concerned about. States that it does not hurt at all or otherwise bother him.   He also states that he has been having more reflux sx, nearly every day since changing from Prevacid to omeprazole, and would like to change back to omeprazole.    No Known Allergies   Current Outpatient Medications:  .  aspirin 81 MG tablet, Take by mouth., Disp: , Rfl:  .  Azelaic Acid (FINACEA) 15 % cream, Apply to affected area twice daily, in the morning and evening., Disp: 50 g, Rfl: 5 .  cilostazol (PLETAL) 50 MG tablet, TAKE ONE TABLET BY MOUTH TWICE A  DAY, Disp: 60 tablet, Rfl: 4 .  diazepam (VALIUM) 10 MG tablet, TAKE ONE TABLET BY MOUTH DAILY AS NEEDED FOR ANXIETY, Disp: 30 tablet, Rfl: 5 .  docusate sodium (COLACE) 100 MG capsule, Take 100 mg by mouth 2 (two) times daily., Disp: , Rfl:  .  EPINEPHrine (EPIPEN 2-PAK) 0.3 mg/0.3 mL IJ SOAJ injection, as needed. , Disp: , Rfl:  .  HYDROcodone-acetaminophen (NORCO) 7.5-325 MG tablet, 1/2-1 tablet up to four times a day  as needed, Disp: 120 tablet, Rfl: 0 .  IRON PO, Take by mouth., Disp: , Rfl:  .  meloxicam (MOBIC) 15 MG tablet, TAKE ONE TABLET BY MOUTH DAILY, Disp: 30 tablet, Rfl: 4 .  Multiple Vitamins-Minerals (CENTRUM SILVER) CHEW, Chew by mouth., Disp: , Rfl:  .  omeprazole (PRILOSEC) 40 MG capsule, Take 1 capsule (40 mg total) by mouth daily., Disp: 90 capsule, Rfl: 4 .  pravastatin (PRAVACHOL) 40 MG tablet, TAKE ONE TABLET BY MOUTH DAILY, Disp: 30 tablet, Rfl: 11 .  pregabalin (LYRICA) 225 MG capsule, TAKE ONE CAPSULE BY MOUTH THREE TIMES A DAY, Disp: 90 capsule, Rfl: 3 .  ramipril (ALTACE) 10 MG capsule, TAKE ONE CAPSULE BY MOUTH DAILY, Disp: 30 capsule, Rfl: 12 .  sildenafil (VIAGRA) 100 MG tablet, TAKE 0.5-1 TABLET BY MOUTH DAILY AS NEEDED FOR ERECTILE DYSFUNCTION, Disp: 5 tablet, Rfl: 8 .  terbinafine (LAMISIL) 250 MG tablet, TAKE TWO TABLETS BY MOUTH DAILY FOR 7 DAYS EACH MONTH, Disp: 14 tablet, Rfl: 2 .  traMADol (ULTRAM) 50 MG tablet, TAKE ONE TABLET BY MOUTH THREE TIMES A DAY AS NEEDED, Disp: 90 tablet, Rfl: 0  Review of Systems  Constitutional: Negative for appetite change, chills and fever.  Respiratory: Negative for chest tightness, shortness of breath and wheezing.   Cardiovascular: Negative for chest pain and palpitations.  Gastrointestinal: Negative for abdominal pain, nausea and vomiting.    Social History   Tobacco Use  . Smoking status: Former Research scientist (life sciences)  . Smokeless tobacco: Never Used  . Tobacco comment: quit 2000, smoked about 1.5 packs for about 5 years  Substance Use Topics  . Alcohol use: No      Objective:   BP (!) 142/80 (BP Location: Right Arm, Cuff Size: Large)   Pulse 70   Temp (!) 96.8 F (36 C) (Temporal)   Resp 16   Wt 241 lb (109.3 kg)   SpO2 97% Comment: room air  BMI 30.94 kg/m  Vitals:   03/02/19 0953 03/02/19 0956  BP: (!) 148/82 (!) 142/80  Pulse: 70   Resp: 16   Temp: (!) 96.8 F (36 C)   TempSrc: Temporal   SpO2: 97%   Weight: 241 lb (109.3  kg)   Body mass index is 30.94 kg/m.   Physical Exam   General Appearance:    Obese male in no acute distress  Eyes:    PERRL, conjunctiva/corneas clear, EOM's intact       Lungs:     Clear to auscultation bilaterally, respirations unlabored  Heart:    Normal heart rate. Normal rhythm. No murmurs, rubs, or gallops.   Abd:   Small ventral hernia just to the right of old upper abdominal surgical scar.   MS:   All extremities are intact.   Neurologic:   Awake, alert, oriented x 3. No apparent focal neurological           defect.        Results for orders placed or performed in visit on 03/02/19  POCT HgB A1C  Result Value Ref Range   Hemoglobin A1C 5.9 (A) 4.0 - 5.6 %   Est. average glucose Bld gHb Est-mCnc 123        Assessment & Plan    1. Pre-diabetes Well controlled.  Continue current medications.    2. Gastroesophageal reflux disease, unspecified whether esophagitis present Having daily sx since change from lansoprazole to omeprazole. Will change back to - lansoprazole (PREVACID) 30 MG capsule; Take 1 capsule (30 mg total) by mouth daily at 12 noon.  Dispense: 90 capsule; Refill: 3  3. Essential hypertension Well controlled.  Continue current medications.    4. Ventral hernia without obstruction or gangrene Counseled on typically benign nature and potential surgical correction. It is not bothering him at all. Continue observation for now.   Future Appointments  Date Time Provider [place]  09/05/2019  9:00 AM Birdie Sons, MD [facility]  02/27/2020  8:45 AM Edrick Kins, DPM [facility]       Lelon Huh, MD  [facility]

## 2019-03-02 NOTE — Patient Instructions (Signed)
. Please review the attached list of medications and notify my office if there are any errors.   . Please bring all of your medications to every appointment so we can make sure that our medication list is the same as yours.   . It is especially important to get the annual flu vaccine this year. If you haven't had it already, please go to your pharmacy or call the office as soon as possible to schedule you flu shot.   Gastroesophageal Reflux Disease, Adult Gastroesophageal reflux (GER) happens when acid from the stomach flows up into the tube that connects the mouth and the stomach (esophagus). Normally, food travels down the esophagus and stays in the stomach to be digested. However, when a person has GER, food and stomach acid sometimes move back up into the esophagus. If this becomes a more serious problem, the person may be diagnosed with a disease called gastroesophageal reflux disease (GERD). GERD occurs when the reflux:  Happens often.  Causes frequent or severe symptoms.  Causes problems such as damage to the esophagus. When stomach acid comes in contact with the esophagus, the acid may cause soreness (inflammation) in the esophagus. Over time, GERD may create small holes (ulcers) in the lining of the esophagus. What are the causes? This condition is caused by a problem with the muscle between the esophagus and the stomach (lower esophageal sphincter, or LES). Normally, the LES muscle closes after food passes through the esophagus to the stomach. When the LES is weakened or abnormal, it does not close properly, and that allows food and stomach acid to go back up into the esophagus. The LES can be weakened by certain dietary substances, medicines, and medical conditions, including:  Tobacco use.  Pregnancy.  Having a hiatal hernia.  Alcohol use.  Certain foods and beverages, such as coffee, chocolate, onions, and peppermint. What increases the risk? You are more likely to develop  this condition if you:  Have an increased body weight.  Have a connective tissue disorder.  Use NSAID medicines. What are the signs or symptoms? Symptoms of this condition include:  Heartburn.  Difficult or painful swallowing.  The feeling of having a lump in the throat.  Abitter taste in the mouth.  Bad breath.  Having a large amount of saliva.  Having an upset or bloated stomach.  Belching.  Chest pain. Different conditions can cause chest pain. Make sure you see your health care provider if you experience chest pain.  Shortness of breath or wheezing.  Ongoing (chronic) cough or a night-time cough.  Wearing away of tooth enamel.  Weight loss. How is this diagnosed? Your health care provider will take a medical history and perform a physical exam. To determine if you have mild or severe GERD, your health care provider may also monitor how you respond to treatment. You may also have tests, including:  A test to examine your stomach and esophagus with a small camera (endoscopy).  A test thatmeasures the acidity level in your esophagus.  A test thatmeasures how much pressure is on your esophagus.  A barium swallow or modified barium swallow test to show the shape, size, and functioning of your esophagus. How is this treated? The goal of treatment is to help relieve your symptoms and to prevent complications. Treatment for this condition may vary depending on how severe your symptoms are. Your health care provider may recommend:  Changes to your diet.  Medicine.  Surgery. Follow these instructions at home: Eating  and drinking   Follow a diet as recommended by your health care provider. This may involve avoiding foods and drinks such as: ? Coffee and tea (with or without caffeine). ? Drinks that containalcohol. ? Energy drinks and sports drinks. ? Carbonated drinks or sodas. ? Chocolate and cocoa. ? Peppermint and mint flavorings. ? Garlic and onions.  ? Horseradish. ? Spicy and acidic foods, including peppers, chili powder, curry powder, vinegar, hot sauces, and barbecue sauce. ? Citrus fruit juices and citrus fruits, such as oranges, lemons, and limes. ? Tomato-based foods, such as red sauce, chili, salsa, and pizza with red sauce. ? Fried and fatty foods, such as donuts, french fries, potato chips, and high-fat dressings. ? High-fat meats, such as hot dogs and fatty cuts of red and white meats, such as rib eye steak, sausage, ham, and bacon. ? High-fat dairy items, such as whole milk, butter, and cream cheese.  Eat small, frequent meals instead of large meals.  Avoid drinking large amounts of liquid with your meals.  Avoid eating meals during the 2-3 hours before bedtime.  Avoid lying down right after you eat.  Do not exercise right after you eat. Lifestyle   Do not use any products that contain nicotine or tobacco, such as cigarettes, e-cigarettes, and chewing tobacco. If you need help quitting, ask your health care provider.  Try to reduce your stress by using methods such as yoga or meditation. If you need help reducing stress, ask your health care provider.  If you are overweight, reduce your weight to an amount that is healthy for you. Ask your health care provider for guidance about a safe weight loss goal. General instructions  Pay attention to any changes in your symptoms.  Take over-the-counter and prescription medicines only as told by your health care provider. Do not take aspirin, ibuprofen, or other NSAIDs unless your health care provider told you to do so.  Wear loose-fitting clothing. Do not wear anything tight around your waist that causes pressure on your abdomen.  Raise (elevate) the head of your bed about 6 inches (15 cm).  Avoid bending over if this makes your symptoms worse.  Keep all follow-up visits as told by your health care provider. This is important. Contact a health care provider if:  You  have: ? New symptoms. ? Unexplained weight loss. ? Difficulty swallowing or it hurts to swallow. ? Wheezing or a persistent cough. ? A hoarse voice.  Your symptoms do not improve with treatment. Get help right away if you:  Have pain in your arms, neck, jaw, teeth, or back.  Feel sweaty, dizzy, or light-headed.  Have chest pain or shortness of breath.  Vomit and your vomit looks like blood or coffee grounds.  Faint.  Have stool that is bloody or black.  Cannot swallow, drink, or eat. Summary  Gastroesophageal reflux happens when acid from the stomach flows up into the esophagus. GERD is a disease in which the reflux happens often, causes frequent or severe symptoms, or causes problems such as damage to the esophagus.  Treatment for this condition may vary depending on how severe your symptoms are. Your health care provider may recommend diet and lifestyle changes, medicine, or surgery.  Contact a health care provider if you have new or worsening symptoms.  Take over-the-counter and prescription medicines only as told by your health care provider. Do not take aspirin, ibuprofen, or other NSAIDs unless your health care provider told you to do so.  Keep all  follow-up visits as told by your health care provider. This is important. This information is not intended to replace advice given to you by your health care provider. Make sure you discuss any questions you have with your health care provider. Document Released: 12/11/2004 Document Revised: 09/09/2017 Document Reviewed: 09/09/2017 Elsevier Patient Education  2020 [employer].

## 2019-03-26 ENCOUNTER — Other Ambulatory Visit: Payer: Self-pay | Admitting: Family Medicine

## 2019-03-27 ENCOUNTER — Other Ambulatory Visit: Payer: Self-pay | Admitting: Family Medicine

## 2019-03-27 DIAGNOSIS — G629 Polyneuropathy, unspecified: Secondary | ICD-10-CM

## 2019-03-28 ENCOUNTER — Other Ambulatory Visit: Payer: Self-pay | Admitting: Family Medicine

## 2019-03-28 DIAGNOSIS — G629 Polyneuropathy, unspecified: Secondary | ICD-10-CM

## 2019-03-28 NOTE — Telephone Encounter (Signed)
Requested medication (s) are due for refill today: yes  Requested medication (s) are on the active medication list: yes  Last refill:  03/01/2019  Future visit scheduled: no  Notes to clinic: This refill cannot be delegated    Requested Prescriptions  Pending Prescriptions Disp Refills   meloxicam (MOBIC) 15 MG tablet [Pharmacy Med Name: MELOXICAM 15 MG TABLET] 30 tablet 3    Sig: TAKE ONE TABLET BY MOUTH DAILY      Analgesics:  COX2 Inhibitors Failed - 03/27/2019 12:48 PM      Failed - HGB in normal range and within 360 days    Hemoglobin  Date Value Ref Range Status  11/20/2016 13.4 13.2 - 17.1 g/dL Final   HGB  Date Value Ref Range Status  06/05/2012 12.3 (L) 13.0 - 18.0 g/dL Final          Passed - Cr in normal range and within 360 days    Creat  Date Value Ref Range Status  11/20/2016 0.90 0.70 - 1.33 mg/dL Final    Comment:    For patients >63 years of age, the reference limit for Creatinine is approximately 13% higher for people identified as African-American. .    Creatinine, Ser  Date Value Ref Range Status  09/03/2018 1.05 0.76 - 1.27 mg/dL Final          Passed - Patient is not pregnant      Passed - Valid encounter within last 12 months    Recent Outpatient Visits           3 weeks ago [place], Kirstie Peri, MD   6 months ago Essential hypertension   [facility] Birdie Sons, MD   10 months ago [place], Kirstie Peri, MD   1 year ago PVD (peripheral vascular disease) [facility])   [facility] Birdie Sons, MD   1 year ago Neuropathy   [facility] Birdie Sons, MD                traMADol (ULTRAM) 50 MG tablet [Pharmacy Med Name: traMADol HCL 50MG  TABLET] 90 tablet 0    Sig: TAKE ONE TABLET BY MOUTH THREE TIMES A DAY AS NEEDED      Not Delegated - Analgesics:  Opioid Agonists Failed - 03/27/2019 12:48 PM      Failed  - This refill cannot be delegated      Failed - Urine Drug Screen completed in last 360 days.      Passed - Valid encounter within last 6 months    Recent Outpatient Visits           3 weeks ago [place], Donald E, MD   6 months ago Essential hypertension   [facility] Birdie Sons, MD   10 months ago [place], Donald E, MD   1 year ago PVD (peripheral vascular disease) [facility])   [facility] Birdie Sons, MD   1 year ago Neuropathy   [facility] Birdie Sons, MD                pregabalin (LYRICA) 225 MG capsule [Pharmacy Med Name: PREGABALIN 225 MG CAPSULE] 90 capsule 2    Sig: TAKE ONE CAPSULE BY [place] A DAY      Not Delegated - Neurology:  Anticonvulsants - Controlled  Failed - 03/27/2019 12:48 PM      Failed - This refill cannot be delegated      Passed - Valid encounter within last 12 months    Recent Outpatient Visits           3 weeks ago [place], Kirstie Peri, MD   6 months ago Essential hypertension   [facility] Birdie Sons, MD   10 months ago [place], Donald E, MD   1 year ago PVD (peripheral vascular disease) [facility])   [facility] Birdie Sons, MD   1 year ago Neuropathy   [facility] Birdie Sons, MD                terbinafine (LAMISIL) 250 MG tablet [Pharmacy Med Name: TERBINAFINE HCL 250 MG TABLET] 14 tablet 1    Sig: TAKE TWO TABLETS BY MOUTH DAILY FOR 7 DAYS EACH MONTH      Off-Protocol Failed - 03/27/2019 12:48 PM      Failed - Medication not assigned to a protocol, review manually.      Passed - Valid encounter within last 12 months    Recent Outpatient Visits           3 weeks ago [place], Donald E, MD   6 months ago  Essential hypertension   [facility] Birdie Sons, MD   10 months ago Bronchitis   [facility] Birdie Sons, MD   1 year ago PVD (peripheral vascular disease) [facility])   [facility] Birdie Sons, MD   1 year ago Neuropathy   [facility] Birdie Sons, MD

## 2019-03-28 NOTE — Telephone Encounter (Signed)
Request for controlled medication.

## 2019-03-28 NOTE — Telephone Encounter (Signed)
Copied from [place] [id number]. Topic: Quick Communication - Rx Refill/Question >> Mar 28, 2019 11:06 AM Rainey Pines A wrote: Medication: HYDROcodone-acetaminophen (NORCO) 7.5-325 MG tablet   Has the patient contacted their pharmacy? {Yes (Agent: If no, request that the patient contact the pharmacy for the refill.) (Agent: If yes, when and what did the pharmacy advise?)Contact PCP  Preferred Pharmacy (with phone number or street name): [place], [place]  Phone:  [phone] Fax:  [phone]     Agent: Please be advised that RX refills may take up to 3 business days. We ask that you follow-up with your pharmacy.

## 2019-03-29 MED ORDER — HYDROCODONE-ACETAMINOPHEN 7.5-325 MG PO TABS: ORAL_TABLET | ORAL | 0 refills | Status: DC

## 2019-04-25 ENCOUNTER — Other Ambulatory Visit: Payer: Self-pay | Admitting: Family Medicine

## 2019-04-25 DIAGNOSIS — I739 Peripheral vascular disease, unspecified: Secondary | ICD-10-CM

## 2019-04-27 ENCOUNTER — Telehealth: Payer: Self-pay | Admitting: Family Medicine

## 2019-04-27 ENCOUNTER — Other Ambulatory Visit: Payer: Self-pay | Admitting: Family Medicine

## 2019-04-27 DIAGNOSIS — I739 Peripheral vascular disease, unspecified: Secondary | ICD-10-CM

## 2019-04-27 DIAGNOSIS — G629 Polyneuropathy, unspecified: Secondary | ICD-10-CM

## 2019-04-27 MED ORDER — HYDROCODONE-ACETAMINOPHEN 7.5-325 MG PO TABS: ORAL_TABLET | ORAL | 0 refills | Status: DC

## 2019-04-27 NOTE — Telephone Encounter (Signed)
Medication Refill - Medication:  HYDROcodone-acetaminophen (NORCO) 7.5-325 MG tablet   Has the patient contacted their pharmacy? Yes advised to call office.   Preferred Pharmacy (with phone number or street name):  [place], [place]  [address], [place] [address]   Agent: Please be advised that RX refills may take up to 3 business days. We ask that you follow-up with your pharmacy.

## 2019-04-27 NOTE — Telephone Encounter (Signed)
Requested medication (s) are due for refill today: yes  Requested medication (s) are on the active medication list: yes  Last refill:  03/29/19  Future visit scheduled: yes  Notes to clinic:  not delegated    Requested Prescriptions  Pending Prescriptions Disp Refills   HYDROcodone-acetaminophen ([place]) 7.5-325 MG tablet 120 tablet 0    Sig: 1/2-1 tablet up to four times a day as needed      Not Delegated - Analgesics:  Opioid Agonist Combinations Failed - 04/27/2019 11:09 AM      Failed - This refill cannot be delegated      Failed - Urine Drug Screen completed in last 360 days.      Passed - Valid encounter within last 6 months    Recent Outpatient Visits           1 month ago Pre-diabetes   [facility] Birdie Sons, MD   7 months ago Essential hypertension   [facility] Birdie Sons, MD   11 months ago Bronchitis   [facility] Birdie Sons, MD   1 year ago PVD (peripheral vascular disease) [facility])   [facility] Birdie Sons, MD   1 year ago Neuropathy   [facility] Birdie Sons, MD

## 2019-04-27 NOTE — Telephone Encounter (Signed)
Requested Prescriptions  Pending Prescriptions Disp Refills  . diazepam (VALIUM) 10 MG tablet [Pharmacy Med Name: DIAZEPAM 10 MG TABLET] 30 tablet 4    Sig: TAKE ONE TABLET BY MOUTH DAILY AS NEEDED FOR ANXIETY     Not Delegated - Psychiatry:  Anxiolytics/Hypnotics Failed - 04/27/2019 11:15 AM      Failed - This refill cannot be delegated      Failed - Urine Drug Screen completed in last 360 days.      Passed - Valid encounter within last 6 months    Recent Outpatient Visits          1 month ago [place] Birdie Sons, MD   7 months ago Essential hypertension   [facility] Birdie Sons, MD   11 months ago [place], Donald E, MD   1 year ago PVD (peripheral vascular disease) [facility])   [facility] Birdie Sons, MD   1 year ago Neuropathy   [facility] Birdie Sons, MD             . cilostazol (PLETAL) 50 MG tablet [Pharmacy Med Name: CILOSTAZOL 50 MG TABLET] 60 tablet 3    Sig: TAKE ONE TABLET BY MOUTH TWICE A DAY     Hematology: Antiplatelets - cilostazol Failed - 04/27/2019 11:15 AM      Failed - HCT in normal range and within 180 days    HCT  Date Value Ref Range Status  11/20/2016 39.4 38.5 - 50.0 % Final  06/04/2012 31.7 (L) 40.0 - 52.0 % Final         Failed - HGB in normal range and within 180 days    Hemoglobin  Date Value Ref Range Status  11/20/2016 13.4 13.2 - 17.1 g/dL Final   HGB  Date Value Ref Range Status  06/05/2012 12.3 (L) 13.0 - 18.0 g/dL Final         Failed - PLT in normal range and within 180 days    Platelets  Date Value Ref Range Status  11/20/2016 172 140 - 400 Thousand/uL Final   Platelet  Date Value Ref Range Status  06/04/2012 127 (L) 150 - 440 x10 3/mm 3 Final         Failed - WBC in normal range and within 180 days    WBC  Date Value Ref Range Status  11/20/2016 4.5 3.8 - 10.8 Thousand/uL Final          Passed - Valid encounter within last 6 months    Recent Outpatient Visits          1 month ago [place], Donald E, MD   7 months ago Essential hypertension   [facility] Birdie Sons, MD   11 months ago [place], Donald E, MD   1 year ago PVD (peripheral vascular disease) [facility])   [facility] Birdie Sons, MD   1 year ago Neuropathy   [place], Kirstie Peri, MD

## 2019-04-27 NOTE — Telephone Encounter (Signed)
Requested medication (s) are due for refill today: yes  Requested medication (s) are on the active medication list: yes  Last refill:  03/29/19  Future visit scheduled: yes  Notes to clinic:  not delegated    Requested Prescriptions  Pending Prescriptions Disp Refills   diazepam (VALIUM) 10 MG tablet [Pharmacy Med Name: DIAZEPAM 10 MG TABLET] 30 tablet 4    Sig: TAKE ONE TABLET BY MOUTH DAILY AS NEEDED FOR ANXIETY      Not Delegated - Psychiatry:  Anxiolytics/Hypnotics Failed - 04/27/2019 11:15 AM      Failed - This refill cannot be delegated      Failed - Urine Drug Screen completed in last 360 days.      Passed - Valid encounter within last 6 months    Recent Outpatient Visits           1 month ago [place] Birdie Sons, MD   7 months ago Essential hypertension   [facility] Birdie Sons, MD   11 months ago [place], Donald E, MD   1 year ago PVD (peripheral vascular disease) [facility])   [facility] Birdie Sons, MD   1 year ago Neuropathy   [facility] Birdie Sons, MD               Signed Prescriptions Disp Refills   cilostazol (PLETAL) 50 MG tablet 60 tablet 3    Sig: TAKE ONE TABLET BY MOUTH TWICE A DAY      Hematology: Antiplatelets - cilostazol Failed - 04/27/2019 11:15 AM      Failed - HCT in normal range and within 180 days    HCT  Date Value Ref Range Status  11/20/2016 39.4 38.5 - 50.0 % Final  06/04/2012 31.7 (L) 40.0 - 52.0 % Final          Failed - HGB in normal range and within 180 days    Hemoglobin  Date Value Ref Range Status  11/20/2016 13.4 13.2 - 17.1 g/dL Final   HGB  Date Value Ref Range Status  06/05/2012 12.3 (L) 13.0 - 18.0 g/dL Final          Failed - PLT in normal range and within 180 days    Platelets  Date Value Ref Range Status  11/20/2016 172 140 - 400 Thousand/uL Final   Platelet  Date  Value Ref Range Status  06/04/2012 127 (L) 150 - 440 x10 3/mm 3 Final          Failed - WBC in normal range and within 180 days    WBC  Date Value Ref Range Status  11/20/2016 4.5 3.8 - 10.8 Thousand/uL Final          Passed - Valid encounter within last 6 months    Recent Outpatient Visits           1 month ago [place], Donald E, MD   7 months ago Essential hypertension   [facility] Birdie Sons, MD   11 months ago [place], Donald E, MD   1 year ago PVD (peripheral vascular disease) [facility])   [facility] Birdie Sons, MD   1 year ago Neuropathy   [employer], Kirstie Peri, MD

## 2019-05-03 ENCOUNTER — Other Ambulatory Visit: Payer: Self-pay

## 2019-05-03 ENCOUNTER — Telehealth: Payer: Self-pay | Admitting: Family Medicine

## 2019-05-03 NOTE — Addendum Note (Signed)
Addended by: Jules Schick on: 05/03/2019 02:15 PM   Modules accepted: Orders

## 2019-05-03 NOTE — Telephone Encounter (Signed)
Called patient to inform him his Azelaic Acid 15%cream was at [facility]. He states that he want it changed to foam. Will route back to office.

## 2019-05-03 NOTE — Telephone Encounter (Signed)
Left a message for patient to return call. No [facility] pharmacy listed in chart. Need to know which pharmacy he is requesting RX be sent to.

## 2019-05-03 NOTE — Telephone Encounter (Signed)
Please send to   [place], [place] Phone:  [phone]  Fax:  [phone]

## 2019-05-03 NOTE — Telephone Encounter (Signed)
Requested medication (s) are due for refill today: yes  Requested medication (s) are on the active medication list:yes  Last refill:  02/16/2019  Future visit scheduled: no  Notes to clinic:  Patient is requesting FOAM not Cream! To go to [facility] please    Requested Prescriptions  Pending Prescriptions Disp Refills   Azelaic Acid (FINACEA) 15 % cream 50 g 5    Sig: Apply to affected area twice daily, in the morning and evening.      Dermatology:  Acne preparations Passed - 05/03/2019  3:13 PM      Passed - Valid encounter within last 12 months    Recent Outpatient Visits           2 months ago [place], Donald E, MD   8 months ago Essential hypertension   [facility] Birdie Sons, MD   11 months ago Bronchitis   [facility] Birdie Sons, MD   1 year ago PVD (peripheral vascular disease) [facility])   [facility] Birdie Sons, MD   1 year ago Neuropathy   [facility] Birdie Sons, MD

## 2019-05-03 NOTE — Telephone Encounter (Signed)
Copied from [place] [phone]. Topic: General - Other >> May 03, 2019  1:43 PM Leward Quan A wrote: Reason for CRM: [facility] called to request an Rx for Finacea Foam states that it has never been filled by them before so they need a new Rx to fill this please.

## 2019-05-04 NOTE — Telephone Encounter (Signed)
Patient calling back about this request. He states pharmacy has not contacted him.

## 2019-05-05 ENCOUNTER — Telehealth: Payer: Self-pay

## 2019-05-05 NOTE — Telephone Encounter (Signed)
Copied from [place] [id number]. Topic: General - Other >> May 03, 2019  1:43 PM Leward Quan A wrote: Reason for CRM: [place] called to request an Rx for Finacea Foam states that it has never been filled by them before so they need a new Rx to fill this please. >> May 05, 2019  2:09 PM Greggory Keen D wrote: PT called asking if the Foam has been sent in.  He thinks the pharmacy has the gel but not the foam.  He wants to know if he can get the foam.  He likes it better  [employer]  CB#  [phone]

## 2019-05-05 NOTE — Telephone Encounter (Signed)
Copied from [place] [id number]. Topic: General - Other >> May 03, 2019  1:43 PM Leward Quan A wrote: Reason for CRM: [place] called to request an Rx for Finacea Foam states that it has never been filled by them before so they need a new Rx to fill this please. >> May 05, 2019  2:09 PM Greggory Keen D wrote: PT called asking if the Foam has been sent in.  He thinks the pharmacy has the gel but not the foam.  He wants to know if he can get the foam.  He likes it better  [employer]  CB#  [phone]

## 2019-05-06 MED ORDER — AZELAIC ACID 15 % EX FOAM: 1.0000 "application " | Freq: Two times a day (BID) | CUTANEOUS | 5 refills | Status: DC

## 2019-05-06 NOTE — Telephone Encounter (Signed)
Pt is requesting to have this rerouted to [facility] [place] ([facility]) it is more cost efficient for the pt.  [address], [place], [place], [place] Phone: [phone] Fax: [phone]  Shonna Chock (Patient's advocate)  [phone] ex [phone]

## 2019-05-06 NOTE — Addendum Note (Signed)
Addended by: Birdie Sons on: 05/06/2019 09:06 AM   Modules accepted: Orders

## 2019-05-06 NOTE — Telephone Encounter (Signed)
Attempted to contact patient, no answer nor voicemail. Calling to advise patient that RX has been sent to pharmacy. Okay for PEC to advise patient.

## 2019-05-06 NOTE — Telephone Encounter (Signed)
Done. New prescription sent into pharmacy on 05/06/2019.

## 2019-05-06 NOTE — Telephone Encounter (Signed)
Call to patient- notified that Rx has been  Sent to pharmacy- patient states his insurance company is trying to help him with alternatives to get Rx at more reasonable price. Office may get call from [place] to discuss.

## 2019-05-06 NOTE — Telephone Encounter (Signed)
Please advise if we are able to e prescribe to pharmacy in [place].

## 2019-05-11 MED ORDER — AZELAIC ACID 15 % EX FOAM: 1.0000 "application " | Freq: Two times a day (BID) | CUTANEOUS | 5 refills | Status: DC

## 2019-05-11 NOTE — Telephone Encounter (Signed)
No, we do not fax prescriptions out of the country. I can give him a printed prescription and he can fax it or mail it himself.

## 2019-05-11 NOTE — Telephone Encounter (Signed)
Tried calling patient. Left message to call back. OK for [facility] triage to advise patient, then route message back to office with patients response.

## 2019-05-11 NOTE — Addendum Note (Signed)
Addended by: Birdie Sons on: 05/11/2019 12:41 PM   Modules accepted: Orders

## 2019-05-11 NOTE — Telephone Encounter (Signed)
Patient advised as below. Patient states he is going to speak with his insurance and call us back to let us know whether he wants to go this route.

## 2019-05-25 ENCOUNTER — Telehealth: Payer: Self-pay

## 2019-05-25 ENCOUNTER — Other Ambulatory Visit: Payer: Self-pay | Admitting: Family Medicine

## 2019-05-25 NOTE — Telephone Encounter (Signed)
Patient came up to the office and confirmed that he requested [employer] to reach out to our office for a prescription. I faxed prescription and cover sheet to fax number listed on the form. [place] who is patient's advocate for [employer] called me and confirmed that she received my fax.

## 2019-05-25 NOTE — Telephone Encounter (Signed)
[place] faxed refill request for the following medications:  Azelaic Acid 15 % FOAM  Please advise. Thanks TNP

## 2019-05-25 NOTE — Telephone Encounter (Signed)
Per Dr. Caryn Section we need to call patient to verify that he requested  [employer] to contact our office for a prescription for Azelaic acid 15% GEL- 90 day supply. From the last phone message  patient was planning to use a [place]. Also, I thought that patient wanted the foam instead of the gel.   I tried calling patient. Left message to call back.

## 2019-05-25 NOTE — Telephone Encounter (Signed)
Copied from [place] [phone]. Topic: General - Other >> May 25, 2019 11:37 AM Yvette Rack wrote: Reason for CRM: Shonna Chock (Patient's advocate) called for an update on the fax that was sent regarding the Azelaic Acid 15 % FOAM. Brittney stated she will fax the request again. Cb# [phone] ext [phone]

## 2019-05-26 ENCOUNTER — Other Ambulatory Visit: Payer: Self-pay | Admitting: Family Medicine

## 2019-05-26 DIAGNOSIS — G629 Polyneuropathy, unspecified: Secondary | ICD-10-CM

## 2019-05-26 NOTE — Telephone Encounter (Signed)
Requested medication (s) are due for refill today - yes  Requested medication (s) are on the active medication list -yes  Future visit scheduled -yes  Last refill: 4 weeks  Notes to clinic: Patient is requesting RF of non delegated Rx  Requested Prescriptions  Pending Prescriptions Disp Refills   HYDROcodone-acetaminophen ([place]) 7.5-325 MG tablet 120 tablet 0    Sig: 1/2-1 tablet up to four times a day as needed      Not Delegated - Analgesics:  Opioid Agonist Combinations Failed - 05/26/2019  2:05 PM      Failed - This refill cannot be delegated      Failed - Urine Drug Screen completed in last 360 days.      Passed - Valid encounter within last 6 months    Recent Outpatient Visits           2 months ago [place] Birdie Sons, MD   8 months ago Essential hypertension   [facility] Birdie Sons, MD   1 year ago [place], Donald E, MD   1 year ago PVD (peripheral vascular disease) [facility])   [facility] Birdie Sons, MD   1 year ago Neuropathy   [facility] Birdie Sons, MD                  Requested Prescriptions  Pending Prescriptions Disp Refills   HYDROcodone-acetaminophen ([place]CO) 7.5-325 MG tablet 120 tablet 0    Sig: 1/2-1 tablet up to four times a day as needed      Not Delegated - Analgesics:  Opioid Agonist Combinations Failed - 05/26/2019  2:05 PM      Failed - This refill cannot be delegated      Failed - Urine Drug Screen completed in last 360 days.      Passed - Valid encounter within last 6 months    Recent Outpatient Visits           2 months ago Pre-diabetes   [facility] Birdie Sons, MD   8 months ago Essential hypertension   [facility] Birdie Sons, MD   1 year ago [place], Donald E, MD   1 year ago PVD (peripheral vascular  disease) [facility])   [facility] Birdie Sons, MD   1 year ago Neuropathy   [facility] Birdie Sons, MD

## 2019-05-26 NOTE — Telephone Encounter (Signed)
Copied from [place] [phone]. Topic: Quick Communication - Rx Refill/Question >> May 26, 2019  2:01 PM Leward Quan A wrote: Medication: HYDROcodone-acetaminophen ([place]) 7.5-325 MG tablet   Has the patient contacted their pharmacy? Yes.   (Agent: If no, request that the patient contact the pharmacy for the refill.) (Agent: If yes, when and what did the pharmacy advise?)  Preferred Pharmacy (with phone number or street name): [place], [place]  Phone:  [phone] Fax:  [phone]     Agent: Please be advised that RX refills may take up to 3 business days. We ask that you follow-up with your pharmacy.

## 2019-05-27 MED ORDER — HYDROCODONE-ACETAMINOPHEN 7.5-325 MG PO TABS: ORAL_TABLET | ORAL | 0 refills | Status: DC

## 2019-05-31 ENCOUNTER — Other Ambulatory Visit: Payer: Self-pay | Admitting: Family Medicine

## 2019-05-31 NOTE — Telephone Encounter (Signed)
TERBINAFINE HCL 250 MG TABLET, medication not assigned to a protocol. LR 03/28/19

## 2019-06-28 ENCOUNTER — Other Ambulatory Visit: Payer: Self-pay | Admitting: Family Medicine

## 2019-06-28 DIAGNOSIS — G629 Polyneuropathy, unspecified: Secondary | ICD-10-CM

## 2019-06-28 NOTE — Telephone Encounter (Signed)
Requested medication (s) are due for refill today -yes  Requested medication (s) are on the active medication list -yes  Future visit scheduled -no  Last refill: 05/27/19  Notes to clinic: Request for non delegated Rx  Requested Prescriptions  Pending Prescriptions Disp Refills   HYDROcodone-acetaminophen (NORCO) 7.5-325 MG tablet 120 tablet 0    Sig: 1/2-1 tablet up to four times a day as needed      Not Delegated - Analgesics:  Opioid Agonist Combinations Failed - 06/28/2019  3:01 PM      Failed - This refill cannot be delegated      Failed - Urine Drug Screen completed in last 360 days.      Passed - Valid encounter within last 6 months    Recent Outpatient Visits           3 months ago [place] Birdie Sons, MD   9 months ago Essential hypertension   [facility] Birdie Sons, MD   1 year ago [place], Donald E, MD   1 year ago PVD (peripheral vascular disease) [facility])   [facility] Birdie Sons, MD   1 year ago Neuropathy   [facility] Birdie Sons, MD                  Requested Prescriptions  Pending Prescriptions Disp Refills   HYDROcodone-acetaminophen (NORCO) 7.5-325 MG tablet 120 tablet 0    Sig: 1/2-1 tablet up to four times a day as needed      Not Delegated - Analgesics:  Opioid Agonist Combinations Failed - 06/28/2019  3:01 PM      Failed - This refill cannot be delegated      Failed - Urine Drug Screen completed in last 360 days.      Passed - Valid encounter within last 6 months    Recent Outpatient Visits           3 months ago Pre-diabetes   [facility] Birdie Sons, MD   9 months ago Essential hypertension   [facility] Birdie Sons, MD   1 year ago [place], Donald E, MD   1 year ago PVD (peripheral vascular disease) [facility])   [facility] Birdie Sons, MD   1 year ago Neuropathy   [facility] Birdie Sons, MD

## 2019-06-28 NOTE — Telephone Encounter (Signed)
Copied from [place] [phone] [phone]. Topic: Quick Communication - Rx Refill/Question >> Jun 28, 2019  2:52 PM Mcneil, Ja-Kwan wrote: Medication: HYDROcodone-acetaminophen ([place]) 7.5-325 MG tablet  Has the patient contacted their pharmacy? no  Preferred Pharmacy (with phone number or street name): [place], [place]  Phone: [phone] Fax: [phone]  Agent: Please be advised that RX refills may take up to 3 business days. We ask that you follow-up with your pharmacy.

## 2019-07-01 MED ORDER — HYDROCODONE-ACETAMINOPHEN 7.5-325 MG PO TABS: ORAL_TABLET | ORAL | 0 refills | Status: DC

## 2019-07-01 NOTE — Telephone Encounter (Signed)
Patient called in checking on status of medication refill. Please advise.  

## 2019-07-24 ENCOUNTER — Other Ambulatory Visit: Payer: Self-pay | Admitting: Family Medicine

## 2019-07-24 DIAGNOSIS — I10 Essential (primary) hypertension: Secondary | ICD-10-CM

## 2019-07-24 NOTE — Telephone Encounter (Signed)
Requested Prescriptions  Pending Prescriptions Disp Refills  . ramipril (ALTACE) 10 MG capsule [Pharmacy Med Name: RAMIPRIL 10 MG CAPSULE] 90 capsule 0    Sig: TAKE ONE CAPSULE BY MOUTH DAILY     Cardiovascular:  ACE Inhibitors Failed - 07/24/2019 12:45 PM      Failed - Cr in normal range and within 180 days    Creat  Date Value Ref Range Status  11/20/2016 0.90 0.70 - 1.33 mg/dL Final    Comment:    For patients >62 years of age, the reference limit for Creatinine is approximately 13% higher for people identified as African-American. .    Creatinine, Ser  Date Value Ref Range Status  09/03/2018 1.05 0.76 - 1.27 mg/dL Final         Failed - K in normal range and within 180 days    Potassium  Date Value Ref Range Status  09/03/2018 4.9 3.5 - 5.2 mmol/L Final  06/04/2012 3.7 3.5 - 5.1 mmol/L Final         Failed - Last BP in normal range    BP Readings from Last 1 Encounters:  03/02/19 (!) 142/80         Passed - Patient is not pregnant      Passed - Valid encounter within last 6 months    Recent Outpatient Visits          4 months ago [place], Donald E, MD   10 months ago Essential hypertension   [facility] Birdie Sons, MD   1 year ago [place], Donald E, MD   1 year ago PVD (peripheral vascular disease) [facility])   [facility] Birdie Sons, MD   1 year ago Neuropathy   [facility] Birdie Sons, MD

## 2019-07-25 ENCOUNTER — Other Ambulatory Visit: Payer: Self-pay | Admitting: Family Medicine

## 2019-07-25 DIAGNOSIS — G629 Polyneuropathy, unspecified: Secondary | ICD-10-CM

## 2019-07-25 NOTE — Telephone Encounter (Signed)
Requested medication (s) are due for refill today - yes  Requested medication (s) are on the active medication list -yes  Future visit scheduled -yes  Last refill: 07/01/19  Notes to clinic: Request for non delegated Rx  Requested Prescriptions  Pending Prescriptions Disp Refills   HYDROcodone-acetaminophen ([place]) 7.5-325 MG tablet 120 tablet 0    Sig: 1/2-1 tablet up to four times a day as needed      Not Delegated - Analgesics:  Opioid Agonist Combinations Failed - 07/25/2019 12:03 PM      Failed - This refill cannot be delegated      Failed - Urine Drug Screen completed in last 360 days.      Passed - Valid encounter within last 6 months    Recent Outpatient Visits           4 months ago [place], Donald E, MD   10 months ago Essential hypertension   [facility] Birdie Sons, MD   1 year ago [place], Donald E, MD   1 year ago PVD (peripheral vascular disease) [facility])   [facility] Birdie Sons, MD   1 year ago Neuropathy   [facility] Birdie Sons, MD                  Requested Prescriptions  Pending Prescriptions Disp Refills   HYDROcodone-acetaminophen (NORCO) 7.5-325 MG tablet 120 tablet 0    Sig: 1/2-1 tablet up to four times a day as needed      Not Delegated - Analgesics:  Opioid Agonist Combinations Failed - 07/25/2019 12:03 PM      Failed - This refill cannot be delegated      Failed - Urine Drug Screen completed in last 360 days.      Passed - Valid encounter within last 6 months    Recent Outpatient Visits           4 months ago Pre-diabetes   [facility] Birdie Sons, MD   10 months ago Essential hypertension   [facility] Birdie Sons, MD   1 year ago [place], Donald E, MD   1 year ago PVD (peripheral vascular disease) [facility])   [facility] Birdie Sons, MD   1 year ago Neuropathy   [facility] Birdie Sons, MD

## 2019-07-25 NOTE — Telephone Encounter (Signed)
Copied from [place] [id number]. Topic: Quick Communication - Rx Refill/Question >> Jul 25, 2019 11:58 AM Rainey Pines A wrote: Medication: HYDROcodone-acetaminophen (NORCO) 7.5-325 MG tablet   Has the patient contacted their pharmacy? Yes (Agent: If no, request that the patient contact the pharmacy for the refill.) (Agent: If yes, when and what did the pharmacy advise?)Contact PCP  Preferred Pharmacy (with phone number or street name): [place], [place]  Phone:  [phone] Fax:  [phone]     Agent: Please be advised that RX refills may take up to 3 business days. We ask that you follow-up with your pharmacy.

## 2019-07-26 ENCOUNTER — Other Ambulatory Visit: Payer: Self-pay | Admitting: Family Medicine

## 2019-07-26 DIAGNOSIS — G629 Polyneuropathy, unspecified: Secondary | ICD-10-CM

## 2019-07-26 MED ORDER — HYDROCODONE-ACETAMINOPHEN 7.5-325 MG PO TABS: ORAL_TABLET | ORAL | 0 refills | Status: DC

## 2019-07-26 NOTE — Telephone Encounter (Signed)
Requested medication (s) are due for refill today: Yes  Requested medication (s) are on the active medication list: Yes  Last refill:  03/28/19  Future visit scheduled: Yes  Notes to clinic:  See request.    Requested Prescriptions  Pending Prescriptions Disp Refills   traMADol (ULTRAM) 50 MG tablet [Pharmacy Med Name: traMADol HCL 50MG  TABLET] 90 tablet 2    Sig: TAKE ONE TABLET BY MOUTH THREE TIMES A DAY AS NEEDED      Not Delegated - Analgesics:  Opioid Agonists Failed - 07/26/2019  2:04 PM      Failed - This refill cannot be delegated      Failed - Urine Drug Screen completed in last 360 days.      Passed - Valid encounter within last 6 months    Recent Outpatient Visits           4 months ago Pre-diabetes   [facility], [facility]   10 months ago Essential hypertension   [facility] Birdie Sons, MD   1 year ago [place], Donald E, MD   1 year ago PVD (peripheral vascular disease) [facility])   [facility] Birdie Sons, MD   1 year ago Neuropathy   [facility] Birdie Sons, MD

## 2019-07-28 ENCOUNTER — Other Ambulatory Visit: Payer: Self-pay | Admitting: Family Medicine

## 2019-07-28 DIAGNOSIS — G629 Polyneuropathy, unspecified: Secondary | ICD-10-CM

## 2019-07-29 ENCOUNTER — Telehealth: Payer: Self-pay

## 2019-07-29 NOTE — Telephone Encounter (Signed)
Copied from [place] [phone]. Topic: General - Other >> Jul 29, 2019 12:01 PM Rainey Pines A wrote: Roseanne Reno called and is faxing over request again for Azelaic Acid 15 % FOAM (90 day supply with 3 refills) to be faxe back over to [phone]

## 2019-08-25 ENCOUNTER — Other Ambulatory Visit: Payer: Self-pay | Admitting: Family Medicine

## 2019-08-25 DIAGNOSIS — I739 Peripheral vascular disease, unspecified: Secondary | ICD-10-CM

## 2019-08-25 DIAGNOSIS — G629 Polyneuropathy, unspecified: Secondary | ICD-10-CM

## 2019-08-25 NOTE — Telephone Encounter (Signed)
Requested medication (s) are due for refill today: yes  Requested medication (s) are on the active medication list: yes  Last refill:  03/28/19 #90 with 3 refills  Future visit scheduled: no  Notes to clinic:  Refill not delegated per protocol    Requested Prescriptions  Pending Prescriptions Disp Refills   LYRICA 225 MG capsule [Pharmacy Med Name: LYRICA 225 MG CAPSULE] 270 capsule 0    Sig: Take 1 capsule by mouth 3 times a day      Not Delegated - Neurology:  Anticonvulsants - Controlled Failed - 08/25/2019  6:49 PM      Failed - This refill cannot be delegated      Passed - Valid encounter within last 12 months    Recent Outpatient Visits           5 months ago Pre-diabetes   [facility] Birdie Sons, MD   11 months ago Essential hypertension   [facility] Birdie Sons, MD   1 year ago [place], Donald E, MD   1 year ago PVD (peripheral vascular disease) [facility])   [facility] Birdie Sons, MD   1 year ago Neuropathy   [facility] Birdie Sons, MD

## 2019-08-25 NOTE — Telephone Encounter (Signed)
Patient stopped by the office requesting refill on Hydrocodone 7.5/325 mg. To be sent to [facility].  Also [employer] is to be sending a request for Lyrica through the patient assistant program.  He will run out of Lyrica before it gets to him. Is it ok for him to take the Gabapentin when he runs out of Lyrica till the new RX of Lyrica  gets to him?

## 2019-08-29 MED ORDER — HYDROCODONE-ACETAMINOPHEN 7.5-325 MG PO TABS: ORAL_TABLET | ORAL | 0 refills | Status: DC

## 2019-08-29 NOTE — Telephone Encounter (Signed)
Yes, he cant gabapentin until the Lyrica arrives. Does he need a prescription for gabapentin or does he already have some.

## 2019-08-30 ENCOUNTER — Telehealth: Payer: Self-pay

## 2019-08-30 DIAGNOSIS — G629 Polyneuropathy, unspecified: Secondary | ICD-10-CM

## 2019-08-30 NOTE — Telephone Encounter (Signed)
Copied from [place] [phone]. Topic: General - Other >> Aug 30, 2019  1:28 PM Wynetta Emery, [place] C wrote: Reason for CRM: pt would like to know if provider would resend his Rx for LYRICA 225 MG capsule? Pt says that he was told by pharmacy that Rx wasn't received. Verified pharmacy, pt says correct. Pt would like to know if he can take his gabapentin until he receive his Rx?   Please advise.

## 2019-08-31 ENCOUNTER — Telehealth: Payer: Self-pay | Admitting: Podiatry

## 2019-08-31 MED ORDER — PREGABALIN 225 MG PO CAPS: 225.0000 mg | ORAL_CAPSULE | Freq: Three times a day (TID) | ORAL | 1 refills | Status: DC

## 2019-08-31 NOTE — Addendum Note (Signed)
Addended by: Birdie Sons on: 08/31/2019 01:14 PM   Modules accepted: Orders

## 2019-08-31 NOTE — Telephone Encounter (Signed)
Lattie Haw from [place] [employer] called wanting some information for permanet diasabillity [id number] please call lisa [phone]

## 2019-08-31 NOTE — Telephone Encounter (Signed)
I spoke with Fara Chute at [facility] and all questions were answered regarding disability

## 2019-09-02 NOTE — Progress Notes (Signed)
Complete physical exam   Patient: Dustin Guerra   DOB: August 17, 1955   64 y.o. Male  MRN: [id number] Visit Date: 09/05/2019  Today's healthcare provider: Lelon Huh, MD   Chief Complaint  Patient presents with  . Annual Exam  . Hypertension  . Gastroesophageal Reflux  . Pre-diabetes   Subjective    Dustin Guerra is a 64 y.o. male who presents today for a complete physical exam.  He reports consuming a general diet. Gym/ health club routine includes light weights. He generally feels well. He reports sleeping well. He does not have additional problems to discuss today.  HPI  Hypertension, follow-up  BP Readings from Last 3 Encounters:  09/05/19 125/74  03/02/19 (!) 142/80  09/03/18 136/68   Wt Readings from Last 3 Encounters:  09/05/19 241 lb 6.4 oz (109.5 kg)  03/02/19 241 lb (109.3 kg)  09/03/18 248 lb (112.5 kg)     He was last seen for hypertension 6 months ago.  BP at that visit was 142/80. Management since that visit includes continue same medications.  He reports good compliance with treatment. He is not having side effects.  He is following a Regular diet. He is exercising. He does not smoke.  Use of agents associated with hypertension: none.   Outside blood pressures are not being checked at home. Symptoms: No chest pain No chest pressure  No palpitations No syncope  No dyspnea No orthopnea  No paroxysmal nocturnal dyspnea No lower extremity edema   Pertinent labs: Lab Results  Component Value Date   CHOL 108 09/03/2018   HDL 29 (L) 09/03/2018   LDLCALC 56 09/03/2018   TRIG 116 09/03/2018   CHOLHDL 3.7 09/03/2018   Lab Results  Component Value Date   NA 139 09/03/2018   K 4.9 09/03/2018   CREATININE 1.05 09/03/2018   GFRNONAA 77 09/03/2018   GFRAA 89 09/03/2018   GLUCOSE 120 (H) 09/03/2018     The ASCVD Risk score (Goff DC Jr., et al., 2013) failed to calculate for the following reasons:   The valid total cholesterol  range is 130 to 320 mg/dL   ---------------------------------------------------------------------------------------------------  Prediabetes, Follow-up  Lab Results  Component Value Date   HGBA1C 5.9 (A) 03/02/2019   HGBA1C 6.6 (H) 09/03/2018   HGBA1C 5.7 05/11/2017   GLUCOSE 120 (H) 09/03/2018   GLUCOSE 101 (H) 08/24/2017   GLUCOSE 108 (H) 07/17/2017    Last seen for for this 6 months ago.  Management since that visit includes no changes. Current symptoms include none   Prior visit with dietician: no Current diet: in general, a "healthy" diet   Current exercise: weightlifting  Pertinent Labs:    Component Value Date/Time   CHOL 108 09/03/2018 1133   CHOL 85 06/04/2012 0610   TRIG 116 09/03/2018 1133   TRIG 109 06/04/2012 0610   CHOLHDL 3.7 09/03/2018 1133   CREATININE 1.05 09/03/2018 1133   CREATININE 0.90 11/20/2016 1153    Wt Readings from Last 3 Encounters:  09/05/19 241 lb 6.4 oz (109.5 kg)  03/02/19 241 lb (109.3 kg)  09/03/18 248 lb (112.5 kg)    -----------------------------------------------------------------------------------------  GERD, Follow up:  The patient was last seen for GERD 6 months ago. Changes made since that visit include changing back to Lansoprazole (PREVACID) 30 MG daily.  He reports good compliance with treatment. He is not having side effects. Marland Kitchen  He IS experiencing no symptoms. Stable.   -----------------------------------------------------------------------------------------  Follow up for Neuropathy:  The patient was last seen for this 6 months ago. Changes made at last visit include continue same medications.  He reports good compliance with treatment. He feels that condition is stable. He is not having side effects.   ----------------------------------------------------------------------------------------- Complains of pain and difficulty extended right third digit for several months. Xray in 2017 revealed mild  diffuse degenerative changes.    Past Medical History:  Diagnosis Date  . Anxiety   . Hypertension   . Plantar fasciitis 08/04/2015   Past Surgical History:  Procedure Laterality Date  . BACK SURGERY    . COLON SURGERY  2015   Colon resection due to diverticulosis  . VEIN LIGATION AND STRIPPING    . VEIN SURGERY     Vein stripping at age [age]   Social History   Socioeconomic History  . Marital status: Single    Spouse name: Not on file  . Number of children: Not on file  . Years of education: Not on file  . Highest education level: Not on file  Occupational History  . Not on file  Tobacco Use  . Smoking status: Former Research scientist (life sciences)  . Smokeless tobacco: Never Used  . Tobacco comment: quit 2000, smoked about 1.5 packs for about 5 years  Vaping Use  . Vaping Use: Never used  Substance and Sexual Activity  . Alcohol use: No  . Drug use: No  . Sexual activity: Not on file  Other Topics Concern  . Not on file  Social History Narrative  . Not on file   Social Determinants of Health   Financial Resource Strain:   . Difficulty of Paying Living Expenses:   Food Insecurity:   . Worried About Charity fundraiser in the Last Year:   . Arboriculturist in the Last Year:   Transportation Needs:   . Film/video editor (Medical):   Marland Kitchen Lack of Transportation (Non-Medical):   Physical Activity:   . Days of Exercise per Week:   . Minutes of Exercise per Session:   Stress:   . Feeling of Stress :   Social Connections:   . Frequency of Communication with Friends and Family:   . Frequency of Social Gatherings with Friends and Family:   . Attends Religious Services:   . Active Member of Clubs or Organizations:   . Attends Archivist Meetings:   Marland Kitchen Marital Status:   Intimate Partner Violence:   . Fear of Current or Ex-Partner:   . Emotionally Abused:   Marland Kitchen Physically Abused:   . Sexually Abused:    Family Status  Relation Name Status  . Mother  Deceased at age 48    . Father  Deceased at age 74  . Brother  Deceased at age 47   Family History  Problem Relation Age of Onset  . Alcohol abuse Mother   . Esophageal cancer Father   . Heart failure Brother    No Known Allergies  Patient Care Team: Birdie Sons, MD as PCP - General (Family Medicine) Delana Meyer, Dolores Lory, MD (Vascular Surgery)   Medications: Outpatient Medications Prior to Visit  Medication Sig  . aspirin 81 MG tablet Take by mouth.  . Azelaic Acid 15 % FOAM Apply 1 application topically 2 (two) times daily.  . cilostazol (PLETAL) 50 MG tablet TAKE ONE TABLET BY MOUTH TWICE A DAY  . diazepam (VALIUM) 10 MG tablet TAKE ONE TABLET BY MOUTH DAILY AS NEEDED FOR ANXIETY  . docusate sodium (COLACE)  100 MG capsule Take 100 mg by mouth 2 (two) times daily.  Marland Kitchen EPINEPHrine (EPIPEN 2-PAK) 0.3 mg/0.3 mL IJ SOAJ injection as needed.   Marland Kitchen HYDROcodone-acetaminophen (NORCO) 7.5-325 MG tablet 1/2-1 tablet up to four times a day as needed  . IRON PO Take by mouth.  . lansoprazole (PREVACID) 30 MG capsule Take 1 capsule (30 mg total) by mouth daily at 12 noon.  . meloxicam (MOBIC) 15 MG tablet TAKE ONE TABLET BY MOUTH DAILY  . Multiple Vitamins-Minerals (CENTRUM SILVER) CHEW Chew by mouth.  . pravastatin (PRAVACHOL) 40 MG tablet TAKE ONE TABLET BY MOUTH DAILY  . pregabalin (LYRICA) 225 MG capsule Take 1 capsule (225 mg total) by mouth 3 (three) times daily.  . ramipril (ALTACE) 10 MG capsule TAKE ONE CAPSULE BY MOUTH DAILY  . sildenafil (VIAGRA) 100 MG tablet TAKE 0.5-1 TABLET BY MOUTH DAILY AS NEEDED FOR ERECTILE DYSFUNCTION  . terbinafine (LAMISIL) 250 MG tablet TAKE 2 TABLETS BY MOUTH DAILY FOR 7 DAYS EACH MONTH  . traMADol (ULTRAM) 50 MG tablet TAKE ONE TABLET BY MOUTH THREE TIMES A DAY AS NEEDED   No facility-administered medications prior to visit.    Review of Systems  Constitutional: Negative for appetite change, chills, fatigue and fever.  HENT: Positive for dental problem. Negative for  congestion, ear pain, hearing loss, nosebleeds and trouble swallowing.   Eyes: Positive for discharge. Negative for pain and visual disturbance.  Respiratory: Negative for cough, chest tightness and shortness of breath.   Cardiovascular: Negative for chest pain, palpitations and leg swelling.  Gastrointestinal: Negative for abdominal pain, blood in stool, constipation, diarrhea, nausea and vomiting.  Endocrine: Negative for polydipsia, polyphagia and polyuria.  Genitourinary: Negative for dysuria and flank pain.  Musculoskeletal: Negative for arthralgias, back pain, joint swelling, myalgias and neck stiffness.  Skin: Negative for color change, rash and wound.  Neurological: Negative for dizziness, tremors, seizures, speech difficulty, weakness, light-headedness and headaches.  Psychiatric/Behavioral: Negative for behavioral problems, confusion, decreased concentration, dysphoric mood and sleep disturbance. The patient is not nervous/anxious.   All other systems reviewed and are negative.     Objective    BP 125/74 (BP Location: Right Arm, Patient Position: Sitting, Cuff Size: Normal)   Pulse 66   Temp (!) 97.3 F (36.3 C) (Temporal)   Ht 6\' 2"  (1.88 m)   Wt 241 lb 6.4 oz (109.5 kg)   BMI 30.99 kg/m    Physical Exam  BP 125/74 (BP Location: Right Arm, Patient Position: Sitting, Cuff Size: Normal)   Pulse 66   Temp (!) 97.3 F (36.3 C) (Temporal)   Ht 6\' 2"  (1.88 m)   Wt 241 lb 6.4 oz (109.5 kg)   BMI 30.99 kg/m    General Appearance:    Obese male. Alert, cooperative, in no acute distress, appears stated age  Head:    Normocephalic, without obvious abnormality, atraumatic  Eyes:    PERRL, conjunctiva/corneas clear, EOM's intact, fundi    benign, both eyes       Ears:    Normal TM's and external ear canals, both ears  Neck:   Supple, symmetrical, trachea midline, no adenopathy;       thyroid:  No enlargement/tenderness/nodules; no carotid   bruit or JVD  Back:      Symmetric, no curvature, ROM normal, no CVA tenderness  Lungs:     Clear to auscultation bilaterally, respirations unlabored  Chest wall:    No tenderness or deformity  Heart:    Normal  heart rate. Normal rhythm. No murmurs, rubs, or gallops.  S1 and S2 normal  Abdomen:     Soft, non-tender, bowel sounds active all four quadrants,    no masses, no organomegaly  Genitalia:    deferred  Rectal:    deferred  Extremities:   All extremities are intact. No cyanosis or edema  Pulses:   2+ and symmetric all extremities  Skin:   Skin color, texture, turgor normal, no rashes or lesions  Lymph nodes:   Cervical, supraclavicular, and axillary nodes normal  Neurologic:   CNII-XII intact. Normal strength, sensation and reflexes      throughout     Depression Screen  PHQ 2/9 Scores 05/14/2018 08/24/2017 07/17/2017  PHQ - 2 Score 0 0 0  PHQ- 9 Score - 0 1    No results found for any visits on 09/05/19.  Assessment & Plan    Routine Health Maintenance and Physical Exam  Exercise Activities and Dietary recommendations Goals   None     Immunization History  Administered Date(s) Administered  . Tdap 08/27/2016  . Zoster Recombinat (Shingrix) 12/09/2017, 03/26/2018    Health Maintenance  Topic Date Due  . COVID-19 Vaccine (1) Never done  . HIV Screening  Never done  . INFLUENZA VACCINE  10/16/2019  . COLONOSCOPY  01/16/2025  . TETANUS/TDAP  08/28/2026  . Hepatitis C Screening  Completed    Discussed health benefits of physical activity, and encouraged him to engage in regular exercise appropriate for his age and condition.  1. Annual physical exam   2. Pre-diabetes  - HgB A1c - EKG 12-Lead  3. Gastroesophageal reflux disease, unspecified whether esophagitis present Very well controlled, currently asymptomatic.   4. Essential hypertension Well controlled.  Continue current medications.   - CBC with Differential - Comprehensive Metabolic Panel (CMET) - EKG 12-Lead  5.  Neuropathy Stable on current medications. Continue current medications.    6. Encounter for screening for HIV  - HIV Antibody (routine testing w rflx)  7. Hyperlipidemia, unspecified hyperlipidemia type He is tolerating pravastatin well with no adverse effects.   - Lipid Profile - TSH - EKG 12-Lead  8. Prostate cancer screening  - PSA Total (Reflex To Free) ([facility] only)  9. Chronic, continuous use of opioids Chronic pain secondary to Charcots foot (managed by Dr. Amalia Hailey) and severe peripheral neuropathy. Doing well current medications with no sign of diversion or abuse.    No follow-ups on file.     The entirety of the information documented in the History of Present Illness, Review of Systems and Physical Exam were personally obtained by me. Portions of this information were initially documented by the CMA and reviewed by me for thoroughness and accuracy.      Lelon Huh, MD  [facility] [phone] (phone) [phone] (fax)  [place]

## 2019-09-05 ENCOUNTER — Ambulatory Visit (INDEPENDENT_AMBULATORY_CARE_PROVIDER_SITE_OTHER): Payer: PRIVATE HEALTH INSURANCE | Admitting: Family Medicine

## 2019-09-05 ENCOUNTER — Encounter: Payer: Self-pay | Admitting: Family Medicine

## 2019-09-05 ENCOUNTER — Telehealth: Payer: Self-pay

## 2019-09-05 ENCOUNTER — Other Ambulatory Visit: Payer: Self-pay

## 2019-09-05 VITALS — BP 125/74 | HR 66 | Temp 97.3°F | Ht 74.0 in | Wt 241.4 lb

## 2019-09-05 DIAGNOSIS — Z114 Encounter for screening for human immunodeficiency virus [HIV]: Secondary | ICD-10-CM

## 2019-09-05 DIAGNOSIS — R7303 Prediabetes: Secondary | ICD-10-CM

## 2019-09-05 DIAGNOSIS — I10 Essential (primary) hypertension: Secondary | ICD-10-CM | POA: Diagnosis not present

## 2019-09-05 DIAGNOSIS — E785 Hyperlipidemia, unspecified: Secondary | ICD-10-CM | POA: Insufficient documentation

## 2019-09-05 DIAGNOSIS — K219 Gastro-esophageal reflux disease without esophagitis: Secondary | ICD-10-CM | POA: Diagnosis not present

## 2019-09-05 DIAGNOSIS — G629 Polyneuropathy, unspecified: Secondary | ICD-10-CM

## 2019-09-05 DIAGNOSIS — Z125 Encounter for screening for malignant neoplasm of prostate: Secondary | ICD-10-CM

## 2019-09-05 DIAGNOSIS — Z0000 Encounter for general adult medical examination without abnormal findings: Secondary | ICD-10-CM | POA: Diagnosis not present

## 2019-09-05 DIAGNOSIS — F1190 Opioid use, unspecified, uncomplicated: Secondary | ICD-10-CM

## 2019-09-05 NOTE — Patient Instructions (Addendum)
.   Please review the attached list of medications and notify my office if there are any errors.   . Try OTC Voltaren Gel for the arthritis your left hand. If that doesn't help then call for referral to an orthopedist.   . Covid-19 vaccines: The Covid vaccines have been given to hundreds of millions of people and found to be very effective and are as safe as any other vaccine.  The [employer] vaccine has been associated with very rare dangerous blood clots, but only in adult women under the age of 72.  The risk of dying from Covid infections is much higher than having a serious reaction to the vaccine.  I strongly recommend getting fully vaccinated against Covid-19.  I recommend that adult women under 60 get fully vaccinated, but the [place] vaccines may be safer for those women than the [employer] vaccine.

## 2019-09-05 NOTE — Telephone Encounter (Signed)
We receive disability forms from [employer]. Medical records will keep a blank copy on file. The forms have been placed in Dr. Maralyn Sago box. Please advise. Thanks TNP

## 2019-09-06 LAB — COMPREHENSIVE METABOLIC PANEL
ALT: 16 IU/L (ref 0–44)
AST: 19 IU/L (ref 0–40)
Albumin/Globulin Ratio: 2.1 (ref 1.2–2.2)
Albumin: 4.2 g/dL (ref 3.8–4.8)
Alkaline Phosphatase: 116 IU/L (ref 48–121)
BUN/Creatinine Ratio: 25 — ABNORMAL HIGH (ref 10–24)
BUN: 25 mg/dL (ref 8–27)
Bilirubin Total: 0.3 mg/dL (ref 0.0–1.2)
CO2: 25 mmol/L (ref 20–29)
Calcium: 9.3 mg/dL (ref 8.6–10.2)
Chloride: 98 mmol/L (ref 96–106)
Creatinine, Ser: 1.01 mg/dL (ref 0.76–1.27)
GFR calc Af Amer: 92 mL/min/{1.73_m2} (ref >59)
GFR calc non Af Amer: 80 mL/min/{1.73_m2} (ref >59)
Globulin, Total: 2 g/dL (ref 1.5–4.5)
Glucose: 128 mg/dL — ABNORMAL HIGH (ref 65–99)
Potassium: 4.6 mmol/L (ref 3.5–5.2)
Sodium: 142 mmol/L (ref 134–144)
Total Protein: 6.2 g/dL (ref 6.0–8.5)

## 2019-09-06 LAB — CBC WITH DIFFERENTIAL/PLATELET
Basophils Absolute: 0 10*3/uL (ref 0.0–0.2)
Basos: 1 %
EOS (ABSOLUTE): 0.2 10*3/uL (ref 0.0–0.4)
Eos: 5 %
Hematocrit: 37.2 % — ABNORMAL LOW (ref 37.5–51.0)
Hemoglobin: 12.4 g/dL — ABNORMAL LOW (ref 13.0–17.7)
Immature Grans (Abs): 0 10*3/uL (ref 0.0–0.1)
Immature Granulocytes: 0 %
Lymphocytes Absolute: 1.3 10*3/uL (ref 0.7–3.1)
Lymphs: 34 %
MCH: 32 pg (ref 26.6–33.0)
MCHC: 33.3 g/dL (ref 31.5–35.7)
MCV: 96 fL (ref 79–97)
Monocytes Absolute: 0.4 10*3/uL (ref 0.1–0.9)
Monocytes: 11 %
Neutrophils Absolute: 1.8 10*3/uL (ref 1.4–7.0)
Neutrophils: 49 %
Platelets: 137 10*3/uL — ABNORMAL LOW (ref 150–450)
RBC: 3.88 x10E6/uL — ABNORMAL LOW (ref 4.14–5.80)
RDW: 12.6 % (ref 11.6–15.4)
WBC: 3.8 10*3/uL (ref 3.4–10.8)

## 2019-09-06 LAB — TSH: TSH: 4.17 u[IU]/mL (ref 0.450–4.500)

## 2019-09-06 LAB — LIPID PANEL
Chol/HDL Ratio: 2.9 ratio (ref 0.0–5.0)
Cholesterol, Total: 97 mg/dL — ABNORMAL LOW (ref 100–199)
HDL: 34 mg/dL — ABNORMAL LOW (ref >39)
LDL Chol Calc (NIH): 52 mg/dL (ref 0–99)
Triglycerides: 42 mg/dL (ref 0–149)
VLDL Cholesterol Cal: 11 mg/dL (ref 5–40)

## 2019-09-06 LAB — PSA TOTAL (REFLEX TO FREE): Prostate Specific Ag, Serum: 0.6 ng/mL (ref 0.0–4.0)

## 2019-09-06 LAB — HEMOGLOBIN A1C
Est. average glucose Bld gHb Est-mCnc: 117 mg/dL
Hgb A1c MFr Bld: 5.7 % — ABNORMAL HIGH (ref 4.8–5.6)

## 2019-09-06 LAB — HIV ANTIBODY (ROUTINE TESTING W REFLEX): HIV Screen 4th Generation wRfx: NONREACTIVE

## 2019-10-01 ENCOUNTER — Other Ambulatory Visit: Payer: Self-pay | Admitting: Family Medicine

## 2019-10-01 NOTE — Telephone Encounter (Signed)
Requested medication (s) are due for refill today: yes  Requested medication (s) are on the active medication list: yes  Last refill:  Lamisil: 06/01/19  Diazepam: 04/27/19  Future visit scheduled: yes  Notes to clinic:  terbinafine:med. Not assigned to a protocol, review manually//// Diazepam: not delegated to NT to RF   Requested Prescriptions  Pending Prescriptions Disp Refills   terbinafine (LAMISIL) 250 MG tablet [Pharmacy Med Name: TERBINAFINE HCL 250 MG TABLET] 14 tablet 2    Sig: TAKE TWO TABLETS BY MOUTH DAILY FOR 7 DAYS EACH MONTH      Off-Protocol Failed - 10/01/2019  4:57 PM      Failed - Medication not assigned to a protocol, review manually.      Passed - Valid encounter within last 12 months    Recent Outpatient Visits           3 weeks ago Annual physical exam   [facility] Birdie Sons, MD   7 months ago [place], Kirstie Peri, MD   1 year ago Essential hypertension   [facility] Birdie Sons, MD   1 year ago [place], Donald E, MD   1 year ago PVD (peripheral vascular disease) [facility])   [facility] Birdie Sons, MD       Future Appointments             In 5 months [place], Kirstie Peri, MD [facility]   In 11 months [place], Kirstie Peri, MD [facility], [facility]              diazepam (VALIUM) 10 MG tablet [Pharmacy Med Name: DIAZEPAM 10 MG TABLET] 30 tablet 3    Sig: TAKE ONE TABLET BY MOUTH DAILY AS NEEDED FOR ANXIETY      Not Delegated - Psychiatry:  Anxiolytics/Hypnotics Failed - 10/01/2019  4:57 PM      Failed - This refill cannot be delegated      Failed - Urine Drug Screen completed in last 360 days.      Passed - Valid encounter within last 6 months    Recent Outpatient Visits           3 weeks ago Annual physical exam   [facility] Birdie Sons, MD   7 months ago  Pre-diabetes   [facility] Birdie Sons, MD   1 year ago Essential hypertension   [facility] Birdie Sons, MD   1 year ago [place], Donald E, MD   1 year ago PVD (peripheral vascular disease) [facility]   [facility] Birdie Sons, MD       Future Appointments             In 5 months Fisher, Kirstie Peri, MD [facility], [facility]   In 11 months Caryn Section, Kirstie Peri, MD [facility], [place]

## 2019-10-03 ENCOUNTER — Other Ambulatory Visit: Payer: Self-pay | Admitting: Family Medicine

## 2019-10-03 DIAGNOSIS — G629 Polyneuropathy, unspecified: Secondary | ICD-10-CM

## 2019-10-03 MED ORDER — HYDROCODONE-ACETAMINOPHEN 7.5-325 MG PO TABS: ORAL_TABLET | ORAL | 0 refills | Status: DC

## 2019-10-03 NOTE — Telephone Encounter (Signed)
Requested medication (s) are due for refill today: yes  Requested medication (s) are on the active medication list: yes  Last refill:  08/29/19  Future visit scheduled: yes  Notes to clinic:  med not delegated to NT to RF   Requested Prescriptions  Pending Prescriptions Disp Refills   HYDROcodone-acetaminophen ([place]) 7.5-325 MG tablet 120 tablet 0    Sig: 1/2-1 tablet up to four times a day as needed      Not Delegated - Analgesics:  Opioid Agonist Combinations Failed - 10/03/2019 10:59 AM      Failed - This refill cannot be delegated      Failed - Urine Drug Screen completed in last 360 days.      Passed - Valid encounter within last 6 months    Recent Outpatient Visits           4 weeks ago Annual physical exam   [facility] Birdie Sons, MD   7 months ago Pre-diabetes   [facility] Birdie Sons, MD   1 year ago Essential hypertension   [facility] Birdie Sons, MD   1 year ago [place], Donald E, MD   1 year ago PVD (peripheral vascular disease) [facility])   [facility] Birdie Sons, MD       Future Appointments             In 5 months [place], Kirstie Peri, MD [facility], [facility]   In 11 months Caryn Section, Kirstie Peri, MD [facility], [place], [employer]

## 2019-10-03 NOTE — Telephone Encounter (Signed)
Patient requesting a refill on HYDROcodone-acetaminophen [facility]) 7.5-325 MG tablet [place], [place] Phone:  [phone]  Fax:  [phone]

## 2019-10-11 ENCOUNTER — Other Ambulatory Visit: Payer: Self-pay | Admitting: Family Medicine

## 2019-10-11 NOTE — Telephone Encounter (Signed)
Patient gets help with patient assistance program:  Needs Rx faxed to [phone]  (can call for questions: [phone] or [phone]) Patient states if the base- foam, cream or gel needs to be changed- he is ok with that. This is the company he gets his Lyrica from.

## 2019-10-11 NOTE — Telephone Encounter (Signed)
Azelaic Acid 15 % FOAM    Patient's pharmacy requesting 90 day supply of this medication with 3 additional refills faxed.    [facility]:  Fax [phone]

## 2019-10-22 ENCOUNTER — Other Ambulatory Visit: Payer: Self-pay | Admitting: Family Medicine

## 2019-10-22 DIAGNOSIS — I10 Essential (primary) hypertension: Secondary | ICD-10-CM

## 2019-10-22 NOTE — Telephone Encounter (Signed)
Requested Prescriptions  Pending Prescriptions Disp Refills  . meloxicam (MOBIC) 15 MG tablet [Pharmacy Med Name: MELOXICAM 15 MG TABLET] 30 tablet 3    Sig: TAKE ONE TABLET BY MOUTH DAILY     Analgesics:  COX2 Inhibitors Failed - 10/22/2019  3:40 PM      Failed - HGB in normal range and within 360 days    Hemoglobin  Date Value Ref Range Status  09/05/2019 12.4 (L) 13.0 - 17.7 g/dL Final         Passed - Cr in normal range and within 360 days    Creat  Date Value Ref Range Status  11/20/2016 0.90 0.70 - 1.33 mg/dL Final    Comment:    For patients >24 years of age, the reference limit for Creatinine is approximately 13% higher for people identified as African-American. .    Creatinine, Ser  Date Value Ref Range Status  09/05/2019 1.01 0.76 - 1.27 mg/dL Final         Passed - Patient is not pregnant      Passed - Valid encounter within last 12 months    Recent Outpatient Visits          1 month ago Annual physical exam   [facility] Birdie Sons, MD   7 months ago [place], Kirstie Peri, MD   1 year ago Essential hypertension   [facility] Birdie Sons, MD   1 year ago [place], Donald E, MD   1 year ago PVD (peripheral vascular disease) [facility])   [facility] Birdie Sons, MD      Future Appointments            In 4 months [place], Kirstie Peri, MD [facility]   In 10 months [place], Kirstie Peri, MD [facility]           . ramipril (ALTACE) 10 MG capsule [Pharmacy Med Name: RAMIPRIL 10 MG CAPSULE] 90 capsule 1    Sig: TAKE ONE CAPSULE BY MOUTH DAILY     Cardiovascular:  ACE Inhibitors Passed - 10/22/2019  3:40 PM      Passed - Cr in normal range and within 180 days    Creat  Date Value Ref Range Status  11/20/2016 0.90 0.70 - 1.33 mg/dL Final    Comment:    For patients >65 years of age, the  reference limit for Creatinine is approximately 13% higher for people identified as African-American. .    Creatinine, Ser  Date Value Ref Range Status  09/05/2019 1.01 0.76 - 1.27 mg/dL Final         Passed - K in normal range and within 180 days    Potassium  Date Value Ref Range Status  09/05/2019 4.6 3.5 - 5.2 mmol/L Final  06/04/2012 3.7 3.5 - 5.1 mmol/L Final         Passed - Patient is not pregnant      Passed - Last BP in normal range    BP Readings from Last 1 Encounters:  09/05/19 125/74         Passed - Valid encounter within last 6 months    Recent Outpatient Visits          1 month ago Annual physical exam   [place], MD   7 months ago [place],  Kirstie Peri, MD   1 year ago Essential hypertension   [facility] Birdie Sons, MD   1 year ago [place], Donald E, MD   1 year ago PVD (peripheral vascular disease) [facility])   [facility] Birdie Sons, MD      Future Appointments            In 4 months [place], Kirstie Peri, MD [facility]   In 10 months Caryn Section, Kirstie Peri, MD [facility], [place]

## 2019-10-24 ENCOUNTER — Telehealth: Payer: Self-pay

## 2019-10-24 NOTE — Telephone Encounter (Signed)
Copied from [place] [phone]. Topic: General - Inquiry >> Oct 24, 2019  3:36 PM Alease Frame wrote: Reason for CRM: Minette Headland from [employer] called requesting a call back for mutual pt   Call back [phone] 8am-8pm est #[id number]

## 2019-10-25 NOTE — Telephone Encounter (Signed)
Dr. Caryn Section, I received the form from [employer]. The form states that they are currently reviewing disability benefits for patient and need your help in providing additional information. Form placed on your desk for review.

## 2019-10-25 NOTE — Telephone Encounter (Signed)
I returned the call to [employer].They were calling in regards to the long term disability claim. The agent I spoke wants an update on the turn around time for the narrative questionnaire that they faxed.The agent advised me that [employer] received the medical records, but they are still waiting on the narrative questionnaire. Have you seen this form? I requested that [employer] re fax this form in case we didn't receive it the first time they sent it.

## 2019-10-26 ENCOUNTER — Telehealth: Payer: Self-pay | Admitting: Family Medicine

## 2019-10-26 NOTE — Telephone Encounter (Signed)
Patient came to office to let Dr. Caryn Section know that he/patient gave the wrong dates for his Long Term Disability Form.  The correct info and dates are:  Dr. Hilaria Ota took pt out of work at Aug. 2019 due to: Charcot Joint in Left foot Severe Neuropathy  Social Security Disability did not pay until Aug of 2020. S.S. Back paid from Aug 2020 to Feb 2020   [place] started paying in Feb 2020.  Patient is asking for a call to clarify the updates with CMA as soon as possible.  Thanks, [employer]

## 2019-10-26 NOTE — Telephone Encounter (Signed)
Patient states he has a charcot joint in left foot and severe neuropathy. He has been on social security since 04/2017. Patient states Dr. Amalia Hailey normally completed the form but patient states his office will not complete because patient does not follow up until December. Patient states [employer] is requesting forms be completed by Dr. Amalia Hailey and Dr. Caryn Section. Patient states he will receive a payment from [employer] from his long term disability.

## 2019-10-26 NOTE — Telephone Encounter (Signed)
I don't see anything in the medical record about this patient being on disability. Is this a new disability claim? Please contact patient about this and solicit as much information as possible regarding the disability claim.

## 2019-10-28 ENCOUNTER — Telehealth: Payer: Self-pay | Admitting: *Deleted

## 2019-10-28 NOTE — Telephone Encounter (Signed)
Best contact: [phone]  [place] called from the long term disability service, requesting a call back from the office regarding the fax sent on 10/26/2019. Wants to confirm if office received fax. Claim number: [id number]

## 2019-10-28 NOTE — Telephone Encounter (Signed)
I called and left a message for Sophronia Simas informing him that Dr. Amalia Hailey has not put the patient on Permanent Disability.  Dr. Amalia Hailey stated that long term disability has to come from Dr. Caryn Section.  I informed him that we have not seen the patient since 02/2019.  I asked him to give me a call back if he has any questions.  Mr. Mellinger, I am calling you in regards to your [place].  Dr. Amalia Hailey said he cannot put you on permanent disability.  He said you have to get that approved from your primary care physician, Dr. Caryn Section.  "Well I didn't initiate the permanent disability, [employer] did.  Dr. Amalia Hailey is the one that diagnosed me with Charcot and Neuropathy."  Yes,he diagnosed you but he only put you on short term disability.  It wasn't for long term or permanent disability.  Dr. Amalia Hailey is not a MD.  A request for permanent disability has to come from your Primary Care Physician.  "Okay, I understand.  I'll call Dr. Maralyn Sago office on Monday."

## 2019-10-31 ENCOUNTER — Other Ambulatory Visit: Payer: Self-pay | Admitting: Family Medicine

## 2019-10-31 ENCOUNTER — Telehealth: Payer: Self-pay

## 2019-10-31 DIAGNOSIS — G629 Polyneuropathy, unspecified: Secondary | ICD-10-CM

## 2019-10-31 NOTE — Telephone Encounter (Signed)
Spoke with patient on the phone and advised him that Dr. Caryn Section is out of office this week. Patient wanted to let Dr. Caryn Section know that he had contacted insurance about disability form and he said a copy of form is being mailed to home, he states that form he left Dr. Caryn Section does not need to address at this time. Patient states that he spoke with Dr. Amalia Hailey ( podiatrist) who told him that he would need to contact his PCP to have a letter written stating patients limitations for work duties. Patient states that he would need this documentation by 01/24/2020. Patient states that he will wait till Dr. Caryn Section returns to discuss and if he needs an appointment he states that he will schedule at that time.

## 2019-10-31 NOTE — Telephone Encounter (Signed)
Copied from [place] [id number]. Topic: General - Call Back - No Documentation >> Oct 31, 2019  2:16 PM Erick Blinks wrote: Reason for CRM: Pt called requesting a call back from nurse. [employer] has sent disability forms, he needs to discuss important information prior to submitting the forms Best contact: [phone]

## 2019-10-31 NOTE — Telephone Encounter (Signed)
Medication Refill - Medication: HYDROcodone-acetaminophen (NORCO) 7.5-325 MG tablet    Has the patient contacted their pharmacy? Yes.   (Agent: If no, request that the patient contact the pharmacy for the refill.) (Agent: If yes, when and what did the pharmacy advise?)  Preferred Pharmacy (with phone number or street name):  [place]  [address] [place] [address]  Phone: [phone] Fax: [phone]     Agent: Please be advised that RX refills may take up to 3 business days. We ask that you follow-up with your pharmacy.

## 2019-10-31 NOTE — Telephone Encounter (Signed)
Please review refill request. Dustin Guerra

## 2019-10-31 NOTE — Telephone Encounter (Signed)
Requested medication (s) are due for refill today: Yes  Requested medication (s) are on the active medication list: Yes  Last refill:  10/03/19  Future visit scheduled: Yes  Notes to clinic:  See request.    Requested Prescriptions  Pending Prescriptions Disp Refills   HYDROcodone-acetaminophen (NORCO) 7.5-325 MG tablet 120 tablet 0    Sig: 1/2-1 tablet up to four times a day as needed      Not Delegated - Analgesics:  Opioid Agonist Combinations Failed - 10/31/2019  2:17 PM      Failed - This refill cannot be delegated      Failed - Urine Drug Screen completed in last 360 days.      Passed - Valid encounter within last 6 months    Recent Outpatient Visits           1 month ago Annual physical exam   [facility] Birdie Sons, MD   8 months ago [place], Donald E, MD   1 year ago Essential hypertension   [facility] Birdie Sons, MD   1 year ago [place], Donald E, MD   1 year ago PVD (peripheral vascular disease) [facility])   [facility] Birdie Sons, MD       Future Appointments             In 4 months Fisher, Kirstie Peri, MD [facility], [place]   In 10 months Caryn Section, Kirstie Peri, MD [facility], [place]

## 2019-11-01 MED ORDER — HYDROCODONE-ACETAMINOPHEN 7.5-325 MG PO TABS: ORAL_TABLET | ORAL | 0 refills | Status: DC

## 2019-11-10 ENCOUNTER — Other Ambulatory Visit: Payer: Self-pay | Admitting: Family Medicine

## 2019-11-10 NOTE — Telephone Encounter (Signed)
This prescription has to be faxed to patient's medication assistance program [employer]. I called [employer] prescription advocacy program and left a message on [employer] voice message system requesting that she send our office a fax request that includes the request for a new prescription and their fax number as previously done in the past.

## 2019-11-10 NOTE — Telephone Encounter (Signed)
Hi. I see where this was sent to the provider on 11/09/19 but no further response. Wanted to make sure the call or fax had been done for the patient. Thank you.

## 2019-11-10 NOTE — Telephone Encounter (Signed)
Medication Refill - Medication: Azelaic Acid, 90 day supply  Has the patient contacted their pharmacy? Yes.   [employer] ( Mail-in service) would like a call back once completed. (Agent: If no, request that the patient contact the pharmacy for the refill.) (Agent: If yes, when and what did the pharmacy advise?)  Preferred Pharmacy (with phone number or street name): [facility]: Please be advised that RX refills may take up to 3 business days. We ask that you follow-up with your pharmacy.

## 2019-11-25 ENCOUNTER — Other Ambulatory Visit: Payer: Self-pay | Admitting: Family Medicine

## 2019-11-25 MED ORDER — AZELAIC ACID 15 % EX FOAM
1.0000 | Freq: Two times a day (BID) | CUTANEOUS | 3 refills | Status: DC
Start: 2019-11-25 — End: 2020-11-12

## 2019-11-30 ENCOUNTER — Telehealth: Payer: Self-pay | Admitting: Family Medicine

## 2019-11-30 NOTE — Telephone Encounter (Signed)
[employer] keeps sending me Disability forms for this patient, but I thought this was being handled by his podiatrist because I do not have any information regarding him being on disability. If that's that case please have him advise his disability insurance to send forms to Dr. Amalia Hailey. Otherwise he will need in office visit to go over disability claim.

## 2019-12-02 ENCOUNTER — Other Ambulatory Visit: Payer: Self-pay

## 2019-12-02 DIAGNOSIS — G629 Polyneuropathy, unspecified: Secondary | ICD-10-CM

## 2019-12-02 MED ORDER — HYDROCODONE-ACETAMINOPHEN 7.5-325 MG PO TABS: ORAL_TABLET | ORAL | 0 refills | Status: DC

## 2019-12-02 NOTE — Telephone Encounter (Signed)
Patient advised as below. Patient says to ignore that papers for now. He is going to schedule an appointment with Dr. Amalia Hailey to see if he can fill out the forms. Patient has a copy of the forms to take to Dr. Amalia Hailey.

## 2019-12-02 NOTE — Telephone Encounter (Signed)
Patient came by the office requesting refill on  Hydrocodone 7.5-325 mg. And also if you would call in Diclofenac 1% gel for the pain in his finger.  Dustin Guerra

## 2019-12-22 ENCOUNTER — Telehealth: Payer: Self-pay

## 2019-12-22 NOTE — Telephone Encounter (Signed)
Copied from [place] [id number]. Topic: General - Other >> Dec 22, 2019  8:58 AM Celene Kras wrote: Reason for CRM: Truman Hayward, from [facility], calling to check on a disability form for pt that was faxed over on 09/04/19. Please advise.

## 2019-12-26 ENCOUNTER — Ambulatory Visit: Payer: No Typology Code available for payment source

## 2019-12-26 ENCOUNTER — Other Ambulatory Visit: Payer: Self-pay

## 2019-12-26 ENCOUNTER — Telehealth: Payer: Self-pay

## 2019-12-26 ENCOUNTER — Ambulatory Visit (INDEPENDENT_AMBULATORY_CARE_PROVIDER_SITE_OTHER): Payer: Self-pay | Admitting: Podiatry

## 2019-12-26 DIAGNOSIS — M14672 Charcot's joint, left ankle and foot: Secondary | ICD-10-CM

## 2019-12-26 DIAGNOSIS — M79672 Pain in left foot: Secondary | ICD-10-CM

## 2019-12-26 NOTE — Progress Notes (Signed)
   Subjective: 64 year old male with PMHx of T2DM presenting today for follow up evaluation Charcot neuropathy of the left midfoot and severe peripheral neuropathy secondary to DM. He states he is doing well. He reports therapy and using the tri-lock brace has helped vastly improve his symptoms. He has been taking Lyrica as directed as well. He denies any new concerns or complaints. Patient is here for further evaluation and treatment.   Past Medical History:  Diagnosis Date  . Anxiety   . Hypertension   . Plantar fasciitis 08/04/2015     Objective: Physical Exam General: The patient is alert and oriented x3 in no acute distress.  Dermatology: Skin is warm, dry and supple bilateral lower extremities. Negative for open lesions or macerations bilateral.   Vascular: Left ankle warm compared to right.  Edema appears to be improved to the left lower extremity.. Dorsalis Pedis and Posterior Tibial pulses palpable bilateral.  Capillary fill time is immediate to all digits.  Neurological: Epicritic and protective threshold diminished bilateral.  Severe peripheral lower extremity neuropathy noted bilateral lower extremities.  Musculoskeletal: Tenderness to palpation to the plantar aspect of the left heel along the plantar fascia and the lateral left foot as well as to the lateral left ankle. All other joints range of motion within normal limits bilateral. Strength 5/5 in all groups bilateral.  Pain on palpation also noted to the right first metatarsal cuneiform joint and along first metatarsal.  Radiographic exam: Degenerative changes noted specifically to the talonavicular joint with some fragmentation of the talar head.  Diffuse degenerative changes noted throughout the midfoot and midtarsal joints.  Medial longitudinal arch appears to be somewhat maintained.  Osseous demineralization noted likely secondary to a disuse osteopenia  Assessment: 1. Charcot neuropathy left midfoot - stable 2.  Severe peripheral neuropathy secondary to DM - improved   Plan of Care:  1. Patient evaluated. X-rays reviewed.  2. Continue using OTC ankle brace.  A new ankle brace was provided today 3. Continue taking Lyrica BID.  4. Recommended good shoe gear.  5.  The patient did present today with some disability paperwork.  I explained to the patient that we do not provide permanent disability documentation her insurance however I can provide limitations and restrictions for work.  Limitations/restrictions include no lifting standing pulling pushing or carrying any weight.  Our office fax paperwork today 6.  Return to clinic in 6 months   *Patient is a pipe fitter.   Edrick Kins, DPM [facility]  Dr. Edrick Kins, DPM    [address], [place] [address]                Office [phone]  Fax [phone]

## 2019-12-26 NOTE — Telephone Encounter (Signed)
Copied from [place] [phone]. Topic: General - Call Back - No Documentation >> Dec 26, 2019  1:35 PM Erick Blinks wrote: [employer] insurance called regarding missing fax from office. Best contact: [phone]

## 2019-12-28 NOTE — Telephone Encounter (Signed)
We received forms a few months ago and Dr. Caryn Section noted in the telephone encounter from 11/30/2019 that his understanding was the forms were being handled by pt's podiatrist. Thanks TNP

## 2020-01-02 ENCOUNTER — Ambulatory Visit: Payer: Self-pay | Admitting: *Deleted

## 2020-01-02 NOTE — Telephone Encounter (Signed)
Feels his tonsils are swollen since Saturday. Noticed white exudate at back of throat and on tonsils today. Denies fever/SOB/difficulty swallowing/cough/congestion/runny nose and denies the throat is sore. No one else in the home is sick. Care Advice including gargling with warm salt water. Ibuprofen for discomfort. Appointment made thru [facility] for tomorrow with Simona Huh. No availability with pcp.  Reason for Disposition  [1] White patches that stick to tongue or inner cheek AND [2] can be wiped off  Answer Assessment - Initial Assessment Questions 1. SYMPTOM: "What's the main symptom you're concerned about?" (e.g., dry mouth. chapped lips, lump)     Tonsils covered with white exudate 2. ONSET: "When did the  tonsils start?"     about 48 hours ago 3. PAIN: "Is there any pain?" If Yes, ask: "How bad is it?" (Scale: 1-10; mild, moderate, severe)     Mildly sore 4. CAUSE: "What do you think is causing the symptoms?"    unsure 5. OTHER SYMPTOMS: "Do you have any other symptoms?" (e.g., fever, sore throat, toothache, swelling)    No other symptoms. 6. PREGNANCY: "Is there any chance you are pregnant?" "When was your last menstrual period?"     na  Protocols used: MOUTH [facility]

## 2020-01-03 ENCOUNTER — Encounter: Payer: Self-pay | Admitting: Family Medicine

## 2020-01-03 ENCOUNTER — Ambulatory Visit (INDEPENDENT_AMBULATORY_CARE_PROVIDER_SITE_OTHER): Payer: Self-pay | Admitting: Family Medicine

## 2020-01-03 ENCOUNTER — Other Ambulatory Visit: Payer: Self-pay

## 2020-01-03 VITALS — BP 162/96 | HR 93 | Temp 98.8°F | Wt 236.0 lb

## 2020-01-03 DIAGNOSIS — M25542 Pain in joints of left hand: Secondary | ICD-10-CM

## 2020-01-03 DIAGNOSIS — M25541 Pain in joints of right hand: Secondary | ICD-10-CM

## 2020-01-03 DIAGNOSIS — J039 Acute tonsillitis, unspecified: Secondary | ICD-10-CM

## 2020-01-03 MED ORDER — AMOXICILLIN-POT CLAVULANATE 875-125 MG PO TABS: 1.0000 | ORAL_TABLET | Freq: Two times a day (BID) | ORAL | 0 refills | Status: DC

## 2020-01-03 MED ORDER — DICLOFENAC SODIUM 1 % EX GEL: 2.0000 g | Freq: Four times a day (QID) | CUTANEOUS | 3 refills | Status: AC

## 2020-01-03 NOTE — Patient Instructions (Signed)
Tonsillitis  Tonsillitis is an infection of the throat that causes the tonsils to become red, tender, and swollen. Tonsils are tissues in the back of your throat. Each tonsil has crevices (crypts). Tonsils normally work to protect the body from infection. What are the causes? Sudden (acute) tonsillitis may be caused by a virus or bacteria, including streptococcal bacteria. Long-lasting (chronic) tonsillitis occurs when the crypts of the tonsils become filled with pieces of food and bacteria, which makes it easy for the tonsils to become repeatedly infected. Tonsillitis can be spread from person to person (is contagious). It may be spread by inhaling droplets that are released with coughing or sneezing. You may also come into contact with viruses or bacteria on surfaces, such as cups or utensils. What are the signs or symptoms? Symptoms of this condition include:  A sore throat. This may include trouble swallowing.  White patches on the tonsils.  Swollen tonsils.  Fever.  Headache.  Tiredness.  Loss of appetite.  Snoring during sleep when you did not snore before.  Small, foul-smelling, yellowish-white pieces of material (tonsilloliths) that you occasionally cough up or spit out. These can cause you to have bad breath. How is this diagnosed? This condition is diagnosed with a physical exam. Diagnosis can be confirmed with the results of lab tests, including a throat culture. How is this treated? Treatment for this condition depends on the cause, but usually focuses on treating the symptoms associated with it. Treatment may include:  Medicines to relieve pain and manage fever.  Steroid medicines to reduce swelling.  Antibiotic medicines if the condition is caused by bacteria. If attacks of tonsillitis are severe and frequent, your health care provider may recommend surgery to remove the tonsils (tonsillectomy). Follow these instructions at home: Medicines  Take over-the-counter  and prescription medicines only as told by your health care provider.  If you were prescribed an antibiotic medicine, take it as told by your health care provider. Do not stop taking the antibiotic even if you start to feel better. Eating and drinking  Drink enough fluid to keep your urine clear or pale yellow.  While your throat is sore, eat soft or liquid foods, such as sherbet, soups, or instant breakfast drinks.  Drink warm liquids.  Eat frozen ice pops. General instructions  Rest as much as possible and get plenty of sleep.  Gargle with a salt-water mixture 3-4 times a day or as needed. To make a salt-water mixture, completely dissolve -1 tsp of salt in 1 cup of warm water.  Wash your hands regularly with soap and water. If soap and water are not available, use hand sanitizer.  Do not share cups, bottles, or other utensils until your symptoms have gone away.  Do not smoke. This can help your symptoms and prevent the infection from coming back. If you need help quitting, ask your health care provider.  Keep all follow-up visits as told by your health care provider. This is important. Contact a health care provider if:  You notice large, tender lumps in your neck that were not there before.  You have a fever that does not go away after 2-3 days.  You develop a rash.  You cough up a green, yellow-brown, or bloody substance.  You cannot swallow liquids or food for 24 hours.  Only one of your tonsils is swollen. Get help right away if:  You develop any new symptoms, such as vomiting, severe headache, stiff neck, chest pain, trouble breathing, or trouble  swallowing.  You have severe throat pain along with drooling or voice changes.  You have severe pain that is not controlled with medicines.  You cannot fully open your mouth.  You develop redness, swelling, or severe pain anywhere in your neck. Summary  Tonsillitis is an infection of the throat that causes the  tonsils to become red, tender, and swollen.  Tonsillitis may be caused by a virus or bacteria.  Rest as much as possible. Get plenty of sleep. This information is not intended to replace advice given to you by your health care provider. Make sure you discuss any questions you have with your health care provider. Document Revised: 02/13/2017 Document Reviewed: 04/08/2016 Elsevier Patient Education  2020 [employer].

## 2020-01-03 NOTE — Progress Notes (Signed)
Acute Office Visit  Subjective:    Patient ID: Dustin Guerra, male    DOB: [birth year], 64 y.o.   MRN: [id number]  No chief complaint on file.   HPI Patient is in today for swollen tonsils with pus on them.  He insists that his throat is not sore, his tonsils hurt. He states this started 4 days ago. He thought it was from snoring but states he snores all the time.  He is on medication for neuropathy that dries his mouth.  He also now states that his glands are swollen.  Past Medical History:  Diagnosis Date  . Anxiety   . Hypertension   . Plantar fasciitis 08/04/2015    Past Surgical History:  Procedure Laterality Date  . BACK SURGERY    . COLON SURGERY  2015   Colon resection due to diverticulosis  . VEIN LIGATION AND STRIPPING    . VEIN SURGERY     Vein stripping at age 73-25    Family History  Problem Relation Age of Onset  . Alcohol abuse Mother   . Esophageal cancer Father   . Heart failure Brother     Social History   Socioeconomic History  . Marital status: Single    Spouse name: Not on file  . Number of children: Not on file  . Years of education: Not on file  . Highest education level: Not on file  Occupational History  . Not on file  Tobacco Use  . Smoking status: Former Research scientist (life sciences)  . Smokeless tobacco: Never Used  . Tobacco comment: quit 2000, smoked about 1.5 packs for about 5 years  Vaping Use  . Vaping Use: Never used  Substance and Sexual Activity  . Alcohol use: No  . Drug use: No  . Sexual activity: Not on file  Other Topics Concern  . Not on file  Social History Narrative  . Not on file   Social Determinants of Health   Financial Resource Strain:   . Difficulty of Paying Living Expenses: Not on file  Food Insecurity:   . Worried About Charity fundraiser in the Last Year: Not on file  . Ran Out of Food in the Last Year: Not on file  Transportation Needs:   . Lack of Transportation (Medical): Not on file  . Lack of  Transportation (Non-Medical): Not on file  Physical Activity:   . Days of Exercise per Week: Not on file  . Minutes of Exercise per Session: Not on file  Stress:   . Feeling of Stress : Not on file  Social Connections:   . Frequency of Communication with Friends and Family: Not on file  . Frequency of Social Gatherings with Friends and Family: Not on file  . Attends Religious Services: Not on file  . Active Member of Clubs or Organizations: Not on file  . Attends Archivist Meetings: Not on file  . Marital Status: Not on file  Intimate Partner Violence:   . Fear of Current or Ex-Partner: Not on file  . Emotionally Abused: Not on file  . Physically Abused: Not on file  . Sexually Abused: Not on file    Outpatient Medications Prior to Visit  Medication Sig Dispense Refill  . aspirin 81 MG tablet Take by mouth.    . Azelaic Acid 15 % FOAM Apply 1 application topically 2 (two) times daily. 150 g 3  . cilostazol (PLETAL) 50 MG tablet TAKE ONE TABLET BY MOUTH TWICE A  DAY 60 tablet 5  . diazepam (VALIUM) 10 MG tablet TAKE ONE TABLET BY MOUTH DAILY AS NEEDED FOR ANXIETY 30 tablet 3  . docusate sodium (COLACE) 100 MG capsule Take 100 mg by mouth 2 (two) times daily.    Marland Kitchen EPINEPHrine (EPIPEN 2-PAK) 0.3 mg/0.3 mL IJ SOAJ injection as needed.     Marland Kitchen HYDROcodone-acetaminophen (NORCO) 7.5-325 MG tablet 1/2-1 tablet up to four times a day as needed 120 tablet 0  . IRON PO Take by mouth.    . lansoprazole (PREVACID) 30 MG capsule Take 1 capsule (30 mg total) by mouth daily at 12 noon. 90 capsule 3  . meloxicam (MOBIC) 15 MG tablet TAKE ONE TABLET BY MOUTH DAILY 30 tablet 3  . Multiple Vitamins-Minerals (CENTRUM SILVER) CHEW Chew by mouth.    . pravastatin (PRAVACHOL) 40 MG tablet TAKE ONE TABLET BY MOUTH DAILY 30 tablet 11  . pregabalin (LYRICA) 225 MG capsule Take 1 capsule (225 mg total) by mouth 3 (three) times daily. 270 capsule 1  . ramipril (ALTACE) 10 MG capsule TAKE ONE CAPSULE BY  MOUTH DAILY 90 capsule 1  . sildenafil (VIAGRA) 100 MG tablet TAKE 0.5-1 TABLET BY MOUTH DAILY AS NEEDED FOR ERECTILE DYSFUNCTION 5 tablet 8  . terbinafine (LAMISIL) 250 MG tablet TAKE TWO TABLETS BY MOUTH DAILY FOR 7 DAYS EACH MONTH 14 tablet 2  . traMADol (ULTRAM) 50 MG tablet TAKE ONE TABLET BY MOUTH THREE TIMES A DAY AS NEEDED 90 tablet 2   No facility-administered medications prior to visit.    No Known Allergies  Review of Systems  Constitutional: Negative for fatigue and fever.  HENT: Positive for sore throat (sore tonsils). Negative for congestion, ear pain, postnasal drip, rhinorrhea and sinus pressure.   Respiratory: Negative for cough and shortness of breath.   Cardiovascular: Negative for chest pain, palpitations and leg swelling.  Musculoskeletal: Negative for arthralgias.  Neurological: Negative for dizziness and headaches.      Objective:    Physical Exam Constitutional:      General: He is not in acute distress.    Appearance: He is well-developed.  HENT:     Head: Normocephalic and atraumatic.     Right Ear: Hearing and tympanic membrane normal.     Left Ear: Hearing and tympanic membrane normal.     Nose: Nose normal.     Mouth/Throat:     Comments: White debris in tonsils with some surrounding erythema but no local lymphadenopathy. Eyes:     General: Lids are normal. No scleral icterus.       Right eye: No discharge.        Left eye: No discharge.     Conjunctiva/sclera: Conjunctivae normal.  Cardiovascular:     Rate and Rhythm: Normal rate and regular rhythm.     Heart sounds: Normal heart sounds.  Pulmonary:     Effort: Pulmonary effort is normal. No respiratory distress.     Breath sounds: Normal breath sounds.  Musculoskeletal:        General: Normal range of motion.     Cervical back: Neck supple.  Lymphadenopathy:     Cervical: No cervical adenopathy.  Skin:    Findings: No lesion or rash.  Neurological:     Mental Status: He is alert and  oriented to person, place, and time.  Psychiatric:        Speech: Speech normal.        Behavior: Behavior normal.  Thought Content: Thought content normal.     BP (!) 162/96 (BP Location: Right Arm, Patient Position: Sitting, Cuff Size: Normal)   Pulse 93   Temp 98.8 F (37.1 C) (Oral)   Wt 236 lb (107 kg)   SpO2 99%   BMI 30.30 kg/m  Wt Readings from Last 3 Encounters:  01/03/20 236 lb (107 kg)  09/05/19 241 lb 6.4 oz (109.5 kg)  03/02/19 241 lb (109.3 kg)    Health Maintenance Due  Topic Date Due  . COVID-19 Vaccine (1) Never done  . INFLUENZA VACCINE  Never done    There are no preventive care reminders to display for this patient.   Lab Results  Component Value Date   TSH 4.170 09/05/2019   Lab Results  Component Value Date   WBC 3.8 09/05/2019   HGB 12.4 (L) 09/05/2019   HCT 37.2 (L) 09/05/2019   MCV 96 09/05/2019   PLT 137 (L) 09/05/2019   Lab Results  Component Value Date   NA 142 09/05/2019   K 4.6 09/05/2019   CO2 25 09/05/2019   GLUCOSE 128 (H) 09/05/2019   BUN 25 09/05/2019   CREATININE 1.01 09/05/2019   BILITOT 0.3 09/05/2019   ALKPHOS 116 09/05/2019   AST 19 09/05/2019   ALT 16 09/05/2019   PROT 6.2 09/05/2019   ALBUMIN 4.2 09/05/2019   CALCIUM 9.3 09/05/2019   ANIONGAP 7 06/04/2012   Lab Results  Component Value Date   CHOL 97 (L) 09/05/2019   Lab Results  Component Value Date   HDL 34 (L) 09/05/2019   Lab Results  Component Value Date   LDLCALC 52 09/05/2019   Lab Results  Component Value Date   TRIG 42 09/05/2019   Lab Results  Component Value Date   CHOLHDL 2.9 09/05/2019   Lab Results  Component Value Date   HGBA1C 5.7 (H) 09/05/2019       Assessment & Plan:   1. Exudative tonsillitis Notices full sensation and redness of tonsils with white dough ball type debris (R>L) the past 3-4 days. No fever or "sore throat". Suspect food trapped in tonsillar crypts with some local reaction. Will treat with  Augmentin and warm saltwater gargle (QID). Recheck if no better in 5-7 days. - amoxicillin-clavulanate (AUGMENTIN) 875-125 MG tablet; Take 1 tablet by mouth 2 (two) times daily.  Dispense: 20 tablet; Refill: 0  2. Arthralgia of both hands History of some arthritis changes on x-rays of hands. Requests prescription for Voltaren Gel for pain in the right 3rd MCP joint. No redness today. - diclofenac Sodium (VOLTAREN) 1 % GEL; Apply 2 g topically 4 (four) times daily.  Dispense: 50 g; Refill: 3    No orders of the defined types were placed in this encounter.  Andres Shad, PA, have reviewed all documentation for this visit. The documentation on 01/03/20 for the exam, diagnosis, procedures, and orders are all accurate and complete.   Juluis Mire, CMA

## 2020-01-04 ENCOUNTER — Telehealth: Payer: Self-pay

## 2020-01-04 NOTE — Telephone Encounter (Signed)
Copied from [place] [id number]. Topic: General - Other >> Jan 04, 2020 10:02 AM Celene Kras wrote: Reason for CRM: Pt called stating that he is needing to speak with Rosheena regarding his disability forms. He states that the date that PCP was looking for was 10/23/17. Pt is requesting to speak with Arbie Cookey. Please advise.

## 2020-01-04 NOTE — Telephone Encounter (Signed)
[employer] forms placed back on Dr. Sabino Snipes desk for completion.

## 2020-01-04 NOTE — Telephone Encounter (Signed)
Form completed.

## 2020-01-05 ENCOUNTER — Telehealth: Payer: Self-pay | Admitting: *Deleted

## 2020-01-05 NOTE — Telephone Encounter (Signed)
"  Hey Ms. Dustin Guerra, I just need to know the date when I first seen Dr. Amalia Hailey in August of 2019.  It was August 2019, I just need the exact date."  I am returning your call.  Your first visit with Dr. Amalia Hailey was 10/23/2017.  "I found out by the receptionist but thank you for calling me back."

## 2020-01-06 ENCOUNTER — Other Ambulatory Visit: Payer: Self-pay | Admitting: Family Medicine

## 2020-01-06 DIAGNOSIS — G629 Polyneuropathy, unspecified: Secondary | ICD-10-CM

## 2020-01-06 MED ORDER — HYDROCODONE-ACETAMINOPHEN 7.5-325 MG PO TABS: ORAL_TABLET | ORAL | 0 refills | Status: DC

## 2020-01-06 NOTE — Telephone Encounter (Signed)
Requested medication (s) are due for refill today: yes  Requested medication (s) are on the active medication list: yes  Last refill:  9/17/  Future visit scheduled: yes  Notes to clinic:  this refill cannot be delegated    Requested Prescriptions  Pending Prescriptions Disp Refills   HYDROcodone-acetaminophen ([place]) 7.5-325 MG tablet 120 tablet 0    Sig: 1/2-1 tablet up to four times a day as needed      Not Delegated - Analgesics:  Opioid Agonist Combinations Failed - 01/06/2020 11:01 AM      Failed - This refill cannot be delegated      Failed - Urine Drug Screen completed in last 360 days.      Passed - Valid encounter within last 6 months    Recent Outpatient Visits           3 days ago Exudative tonsillitis   [place], [place]   4 months ago Annual physical exam   [facility] Birdie Sons, MD   10 months ago Pre-diabetes   [facility] Birdie Sons, MD   1 year ago Essential hypertension   [facility] Birdie Sons, MD   1 year ago [place], Donald E, MD       Future Appointments             In 2 months Fisher, Kirstie Peri, MD [facility], [place]   In 8 months Fisher, Kirstie Peri, MD [facility], [place]

## 2020-01-06 NOTE — Telephone Encounter (Signed)
Medication Refill - Medication:HYDROcodone-acetaminophen (NORCO) 7.5-325 MG tablet    Has the patient contacted their pharmacy?yes (Agent: If no, request that the patient contact the pharmacy for the refill.) (Agent: If yes, when and what did the pharmacy advise?)Contat PCP  Preferred Pharmacy (with phone number or street name):  [place], [place] Phone:  [phone]  Fax:  [phone]       Agent: Please be advised that RX refills may take up to 3 business days. We ask that you follow-up with your pharmacy.

## 2020-01-07 ENCOUNTER — Other Ambulatory Visit: Payer: Self-pay | Admitting: Family Medicine

## 2020-01-07 NOTE — Telephone Encounter (Signed)
Requested medication (s) are due for refill today: yes  Requested medication (s) are on the active medication list: yes  Last refill:  10/02/19  Future visit scheduled: yes  Notes to clinic:  med not assigned to a protocol   Requested Prescriptions  Pending Prescriptions Disp Refills   terbinafine (LAMISIL) 250 MG tablet [Pharmacy Med Name: TERBINAFINE HCL 250 MG TABLET] 14 tablet 2    Sig: TAKE TWO TABLETS BY MOUTH DAILY FOR 7 DAYS OF EACH MONTH      Off-Protocol Failed - 01/07/2020 12:30 PM      Failed - Medication not assigned to a protocol, review manually.      Passed - Valid encounter within last 12 months    Recent Outpatient Visits           4 days ago Exudative tonsillitis   [place], [place]   4 months ago Annual physical exam   [facility] Birdie Sons, MD   10 months ago Pre-diabetes   [facility] Birdie Sons, MD   1 year ago Essential hypertension   [facility] Birdie Sons, MD   1 year ago [place], Donald E, MD       Future Appointments             In 2 months Fisher, Kirstie Peri, MD [facility], [place]   In 8 months Fisher, Kirstie Peri, MD [facility], [place]

## 2020-01-11 ENCOUNTER — Telehealth: Payer: Self-pay

## 2020-01-11 NOTE — Telephone Encounter (Signed)
Copied from [place] [id number]. Topic: General - Other >> Jan 11, 2020  1:45 PM Leward Quan A wrote: Reason for CRM: Dr Gustavo Lah with [facility] called to discuss some restrictions with Dr Sabino Snipes team for the patient. Please call Dr Maudie Mercury at Ph#  [phone]

## 2020-01-13 ENCOUNTER — Telehealth: Payer: Self-pay

## 2020-01-13 ENCOUNTER — Telehealth: Payer: Self-pay | Admitting: Podiatry

## 2020-01-13 NOTE — Telephone Encounter (Signed)
Dr. Maudie Mercury called back for status update

## 2020-01-13 NOTE — Telephone Encounter (Signed)
Dr. Gustavo Lah called today from [place]  to discuss work restrictions with Dr. Amalia Hailey and or his nurse. Please advise and give Dr. Maudie Mercury a call at [phone]. Thanks

## 2020-01-13 NOTE — Telephone Encounter (Signed)
Returned call to Dr. Maudie Mercury and left voice mail of restrictions/limitations for patient per Dr. Amalia Hailey last office note.

## 2020-01-13 NOTE — Telephone Encounter (Signed)
Tried calling Dr. Gustavo Lah. Left message to call back. OK for [facility] triage to speak with Butch Penny about her concerns.

## 2020-01-16 NOTE — Telephone Encounter (Signed)
Dr. Maudie Mercury returned call and says a form will be faxed to Dr. Caryn Section hopefully today that he will need to address concerning the patient's restrictions as far as work and Dr. Julianne Rice take on the restrictions for the patient based on the office visit notes of Dr. Amalia Hailey, who states the patient is stable and to return for follow up in 6 months. She says if Dr. Caryn Section would like to speak to her directly once he receives the letter, he can call her.

## 2020-01-17 ENCOUNTER — Telehealth: Payer: Self-pay

## 2020-01-17 NOTE — Telephone Encounter (Signed)
Have we received this fax?

## 2020-01-17 NOTE — Telephone Encounter (Signed)
Copied from [place] [phone]. Topic: General - Other >> Jan 17, 2020  3:06 PM Celene Kras wrote: Reason for CRM: melanie, from [employer], called stating that she faxed over a letter regarding the pts disability. She states that the signature of provider and answer to the question is needed. Please advise.

## 2020-01-28 ENCOUNTER — Other Ambulatory Visit: Payer: Self-pay | Admitting: Family Medicine

## 2020-01-28 NOTE — Telephone Encounter (Signed)
Requested Prescriptions  Pending Prescriptions Disp Refills  . lansoprazole (PREVACID) 30 MG capsule [Pharmacy Med Name: LANSOPRAZOLE DR 30 MG CAPSULE] 90 capsule 3    Sig: TAKE ONE CAPSULE BY MOUTH DAILY AT 12 NOON     Gastroenterology: Proton Pump Inhibitors Passed - 01/28/2020 10:00 AM      Passed - Valid encounter within last 12 months    Recent Outpatient Visits          3 weeks ago Exudative tonsillitis   [place], [place]   4 months ago Annual physical exam   [facility] Birdie Sons, MD   11 months ago Pre-diabetes   [facility] Birdie Sons, MD   1 year ago Essential hypertension   [facility] Birdie Sons, MD   1 year ago [place], Donald E, MD      Future Appointments            In 1 month Fisher, Kirstie Peri, MD [facility], [place]   In 7 months Fisher, Kirstie Peri, MD [facility], [place]           . pravastatin (PRAVACHOL) 40 MG tablet [Pharmacy Med Name: PRAVASTATIN SODIUM 40 MG TAB] 90 tablet 1    Sig: TAKE ONE TABLET BY MOUTH DAILY     Cardiovascular:  Antilipid - Statins Failed - 01/28/2020 10:00 AM      Failed - Total Cholesterol in normal range and within 360 days    Cholesterol, Total  Date Value Ref Range Status  09/05/2019 97 (L) 100 - 199 mg/dL Final   Cholesterol  Date Value Ref Range Status  06/04/2012 85 0 - 200 mg/dL Final         Failed - LDL in normal range and within 360 days    Ldl Cholesterol, Calc  Date Value Ref Range Status  06/04/2012 39 0 - 100 mg/dL Final   LDL Chol Calc (NIH)  Date Value Ref Range Status  09/05/2019 52 0 - 99 mg/dL Final         Failed - HDL in normal range and within 360 days    HDL Cholesterol  Date Value Ref Range Status  06/04/2012 24 (L) 40 - 60 mg/dL Final   HDL  Date Value Ref Range Status  09/05/2019 34 (L) >39 mg/dL Final         Passed -  Triglycerides in normal range and within 360 days    Triglycerides  Date Value Ref Range Status  09/05/2019 42 0 - 149 mg/dL Final  06/04/2012 109 0 - 200 mg/dL Final         Passed - Patient is not pregnant      Passed - Valid encounter within last 12 months    Recent Outpatient Visits          3 weeks ago Exudative tonsillitis   [facility], [place] Muff, [place]   4 months ago Annual physical exam   [facility] Birdie Sons, MD   11 months ago Pre-diabetes   [facility] Birdie Sons, MD   1 year ago Essential hypertension   [facility] Birdie Sons, MD   1 year ago [place], Kirstie Peri, MD      Future Appointments            In 1 month Fisher,  Kirstie Peri, MD [facility], [place]   In 7 months Fisher, Kirstie Peri, MD [facility], [place]

## 2020-02-01 ENCOUNTER — Telehealth: Payer: Self-pay

## 2020-02-01 NOTE — Telephone Encounter (Signed)
FYI. Thanks.

## 2020-02-01 NOTE — Telephone Encounter (Signed)
Copied from [place] [phone]. Topic: General - Other >> Feb 01, 2020 10:56 AM Celene Kras wrote: Reason for CRM: Pt called stating that he is in a patient assistance program for his Lyrica. He states that the representative will be calling today or tomorrow for a refill. Pleasde advise.

## 2020-02-02 ENCOUNTER — Other Ambulatory Visit: Payer: Self-pay | Admitting: Family Medicine

## 2020-02-02 DIAGNOSIS — G629 Polyneuropathy, unspecified: Secondary | ICD-10-CM

## 2020-02-02 NOTE — Telephone Encounter (Signed)
Pt is calling and needs a refill on hydrocodone. [facility] in [place].

## 2020-02-03 MED ORDER — HYDROCODONE-ACETAMINOPHEN 7.5-325 MG PO TABS: ORAL_TABLET | ORAL | 0 refills | Status: DC

## 2020-02-03 NOTE — Telephone Encounter (Signed)
error 

## 2020-02-07 ENCOUNTER — Other Ambulatory Visit: Payer: Self-pay | Admitting: Family Medicine

## 2020-02-07 DIAGNOSIS — G629 Polyneuropathy, unspecified: Secondary | ICD-10-CM

## 2020-02-07 NOTE — Telephone Encounter (Signed)
Requested medication (s) are due for refill today: yes  Requested medication (s) are on the active medication list: yes  Last refill:             Tramadol    07/28/19  #90  2 refills         Valium   10/02/19  #30  3 refills  Future visit scheduled Yes 03/12/20  Notes to clinic: Not delegated  Requested Prescriptions  Pending Prescriptions Disp Refills   traMADol (ULTRAM) 50 MG tablet [Pharmacy Med Name: traMADol HCL 50MG  TABLET] 90 tablet     Sig: TAKE ONE TABLET BY MOUTH THREE TIMES A DAY AS NEEDED      Not Delegated - Analgesics:  Opioid Agonists Failed - 02/07/2020 11:08 PM      Failed - This refill cannot be delegated      Failed - Urine Drug Screen completed in last 360 days      Passed - Valid encounter within last 6 months    Recent Outpatient Visits           1 month ago Exudative tonsillitis   [place]   5 months ago Annual physical exam   [facility] Birdie Sons, MD   11 months ago Pre-diabetes   [place], Kirstie Peri, MD   1 year ago Essential hypertension   [place], Kirstie Peri, MD   1 year ago [place], Kirstie Peri, MD       Future Appointments             In 1 month Fisher, Kirstie Peri, MD [facility], [place]   In 7 months Fisher, Kirstie Peri, MD [facility], [facility]              diazepam (VALIUM) 10 MG tablet [Pharmacy Med Name: DIAZEPAM 10 MG TABLET] 30 tablet     Sig: TAKE ONE TABLET BY MOUTH DAILY AS NEEDED FOR ANXIETY      Not Delegated - Psychiatry:  Anxiolytics/Hypnotics Failed - 02/07/2020 11:08 PM      Failed - This refill cannot be delegated      Failed - Urine Drug Screen completed in last 360 days      Passed - Valid encounter within last 6 months    Recent Outpatient Visits           1 month ago Exudative tonsillitis   [place]   5  months ago Annual physical exam   [facility] Birdie Sons, MD   11 months ago Pre-diabetes   [facility] Birdie Sons, MD   1 year ago Essential hypertension   [facility] Birdie Sons, MD   1 year ago [place], Donald E, MD       Future Appointments             In 1 month Fisher, Kirstie Peri, MD [facility], [place]   In 7 months Fisher, Kirstie Peri, MD [facility], [place]

## 2020-02-20 ENCOUNTER — Other Ambulatory Visit: Payer: Self-pay | Admitting: Family Medicine

## 2020-02-20 DIAGNOSIS — G629 Polyneuropathy, unspecified: Secondary | ICD-10-CM

## 2020-02-20 MED ORDER — PREGABALIN 225 MG PO CAPS: 225.0000 mg | ORAL_CAPSULE | Freq: Three times a day (TID) | ORAL | 1 refills | Status: DC

## 2020-02-20 NOTE — Telephone Encounter (Signed)
Pt came by office to have his pregabalin (LYRICA) 225 MG capsule refilled through [place]  Fax [phone] [place] [address]  Patient is asking for a call back when sent.  Thanks, [employer]

## 2020-02-27 ENCOUNTER — Other Ambulatory Visit: Payer: Self-pay | Admitting: Family Medicine

## 2020-02-27 ENCOUNTER — Ambulatory Visit: Payer: No Typology Code available for payment source | Admitting: Podiatry

## 2020-03-07 ENCOUNTER — Ambulatory Visit: Payer: PRIVATE HEALTH INSURANCE | Admitting: Family Medicine

## 2020-03-12 ENCOUNTER — Ambulatory Visit (INDEPENDENT_AMBULATORY_CARE_PROVIDER_SITE_OTHER): Payer: Self-pay | Admitting: Family Medicine

## 2020-03-12 ENCOUNTER — Other Ambulatory Visit: Payer: Self-pay

## 2020-03-12 ENCOUNTER — Encounter: Payer: Self-pay | Admitting: Family Medicine

## 2020-03-12 VITALS — BP 150/94 | HR 67 | Temp 98.2°F | Resp 16 | Ht 74.0 in | Wt 226.0 lb

## 2020-03-12 DIAGNOSIS — R7303 Prediabetes: Secondary | ICD-10-CM

## 2020-03-12 DIAGNOSIS — R42 Dizziness and giddiness: Secondary | ICD-10-CM

## 2020-03-12 DIAGNOSIS — I739 Peripheral vascular disease, unspecified: Secondary | ICD-10-CM

## 2020-03-12 DIAGNOSIS — G609 Hereditary and idiopathic neuropathy, unspecified: Secondary | ICD-10-CM

## 2020-03-12 LAB — POCT GLYCOSYLATED HEMOGLOBIN (HGB A1C): Hemoglobin A1C: 5.5 % (ref 4.0–5.6)

## 2020-03-12 MED ORDER — MECLIZINE HCL 25 MG PO TABS: 25.0000 mg | ORAL_TABLET | Freq: Three times a day (TID) | ORAL | 0 refills | Status: AC | PRN

## 2020-03-12 NOTE — Progress Notes (Signed)
Established patient visit   Patient: Dustin Guerra   DOB: Nov 22, 1955   64 y.o. Male  MRN: [id number] Visit Date: 03/12/2020  Today's healthcare provider: Lelon Huh, MD   Chief Complaint  Patient presents with  . Hypertension  . Hyperglycemia  . Dizziness   Subjective    HPI  Hypertension, follow-up  BP Readings from Last 3 Encounters:  03/12/20 (!) 170/95  01/03/20 (!) 162/96  09/05/19 125/74   Wt Readings from Last 3 Encounters:  03/12/20 226 lb (102.5 kg)  01/03/20 236 lb (107 kg)  09/05/19 241 lb 6.4 oz (109.5 kg)     He was last seen for hypertension 6 months ago.  BP at that visit was 125/74. Management since that visit includes continuing same medication.  He reports good compliance with treatment. He states he did have several drinks late yesterday which he feels is affecting his BP today.  He is not having side effects.  He is following a Regular diet. He is not exercising. He does not smoke.  Use of agents associated with hypertension: NSAIDS.   Outside blood pressures are not being checked. Symptoms: No chest pain No chest pressure  No palpitations No syncope  No dyspnea No orthopnea  No paroxysmal nocturnal dyspnea No lower extremity edema   Pertinent labs: Lab Results  Component Value Date   CHOL 97 (L) 09/05/2019   HDL 34 (L) 09/05/2019   LDLCALC 52 09/05/2019   TRIG 42 09/05/2019   CHOLHDL 2.9 09/05/2019   Lab Results  Component Value Date   NA 142 09/05/2019   K 4.6 09/05/2019   CREATININE 1.01 09/05/2019   GFRNONAA 80 09/05/2019   GFRAA 92 09/05/2019   GLUCOSE 128 (H) 09/05/2019     The ASCVD Risk score (Goff DC Jr., et al., 2013) failed to calculate for the following reasons:   The valid total cholesterol range is 130 to 320 mg/dL   Prediabetes, Follow-up  Lab Results  Component Value Date   HGBA1C 5.7 (H) 09/05/2019   HGBA1C 5.9 (A) 03/02/2019   HGBA1C 6.6 (H) 09/03/2018   GLUCOSE 128 (H) 09/05/2019    GLUCOSE 120 (H) 09/03/2018   GLUCOSE 101 (H) 08/24/2017    Last seen for for this 6 months ago.  Management since that visit includes continuing healthy diet. Current symptoms include none and have been stable.  Prior visit with dietician: no Current diet: well balanced Current exercise: none  Pertinent Labs:    Component Value Date/Time   CHOL 97 (L) 09/05/2019 1007   CHOL 85 06/04/2012 0610   TRIG 42 09/05/2019 1007   TRIG 109 06/04/2012 0610   CHOLHDL 2.9 09/05/2019 1007   CREATININE 1.01 09/05/2019 1007   CREATININE 0.90 11/20/2016 1153    Wt Readings from Last 3 Encounters:  03/12/20 226 lb (102.5 kg)  01/03/20 236 lb (107 kg)  09/05/19 241 lb 6.4 oz (109.5 kg)    Follow up for Neuropathy:  The patient was last seen for this 6 months ago. Changes made at last visit include none; continue same medication.  He reports good compliance with treatment. He feels that condition is Unchanged. He is not having side effects.    Vertigo Patient reports that he has had 3 episodes of vertigo in the last month. He is requesting refills on Meclizine which he was taking and were effective, but has run out.      Medications: Outpatient Medications Prior to Visit  Medication Sig  .  aspirin 81 MG tablet Take by mouth.  . Azelaic Acid 15 % FOAM Apply 1 application topically 2 (two) times daily.  . cilostazol (PLETAL) 50 MG tablet TAKE ONE TABLET BY MOUTH TWICE A DAY  . diazepam (VALIUM) 10 MG tablet Take 1 tablet (10 mg total) by mouth daily as needed for anxiety.  . diclofenac Sodium (VOLTAREN) 1 % GEL Apply 2 g topically 4 (four) times daily.  Marland Kitchen docusate sodium (COLACE) 100 MG capsule Take 100 mg by mouth 2 (two) times daily.  Marland Kitchen EPINEPHrine (EPIPEN 2-PAK) 0.3 mg/0.3 mL IJ SOAJ injection as needed.   Marland Kitchen HYDROcodone-acetaminophen (NORCO) 7.5-325 MG tablet 1/2-1 tablet up to four times a day as needed  . IRON PO Take by mouth.  . lansoprazole (PREVACID) 30 MG capsule TAKE ONE  CAPSULE BY MOUTH DAILY AT 12 NOON  . meloxicam (MOBIC) 15 MG tablet TAKE ONE TABLET BY MOUTH DAILY  . Multiple Vitamins-Minerals (CENTRUM SILVER) CHEW Chew by mouth.  . pravastatin (PRAVACHOL) 40 MG tablet TAKE ONE TABLET BY MOUTH DAILY  . pregabalin (LYRICA) 225 MG capsule Take 1 capsule (225 mg total) by mouth 3 (three) times daily.  . ramipril (ALTACE) 10 MG capsule TAKE ONE CAPSULE BY MOUTH DAILY  . sildenafil (VIAGRA) 100 MG tablet TAKE 0.5-1 TABLET BY MOUTH DAILY AS NEEDED FOR ERECTILE DYSFUNCTION  . terbinafine (LAMISIL) 250 MG tablet TAKE TWO TABLETS BY MOUTH DAILY FOR 7 DAYS OF EACH MONTH  . traMADol (ULTRAM) 50 MG tablet Take 1 tablet (50 mg total) by mouth 3 (three) times daily as needed.  . [DISCONTINUED] amoxicillin-clavulanate (AUGMENTIN) 875-125 MG tablet Take 1 tablet by mouth 2 (two) times daily.   No facility-administered medications prior to visit.    Review of Systems  Constitutional: Negative for appetite change, chills and fever.  Respiratory: Negative for chest tightness, shortness of breath and wheezing.   Cardiovascular: Negative for chest pain and palpitations.  Gastrointestinal: Negative for abdominal pain, nausea and vomiting.  Neurological: Positive for dizziness.      Objective    BP (!) 170/95   Pulse 67   Temp 98.2 F (36.8 C)   Resp 16   Ht 6\' 2"  (1.88 m)   Wt 226 lb (102.5 kg)   BMI 29.02 kg/m    Physical Exam    General: Appearance:     Overweight male in no acute distress  Eyes:    PERRL, conjunctiva/corneas clear, EOM's intact       Lungs:     Clear to auscultation bilaterally, respirations unlabored  Heart:    Normal heart rate. Normal rhythm. No murmurs, rubs, or gallops.   MS:   All extremities are intact.   Neurologic:   Awake, alert, oriented x 3. No apparent focal neurological           defect.          Assessment & Plan     1. Pre-diabetes Well controlled with diet.   2. Vertigo refill- meclizine (ANTIVERT) 25 MG  tablet; Take 1 tablet (25 mg total) by mouth 3 (three) times daily as needed for dizziness.  Dispense: 30 tablet; Refill: 0  3. PVD (peripheral vascular disease) (HCC) Stable, Continue current medications.    4. Idiopathic neuropathy symptomatically controlled. Continue current medications.    Future Appointments  Date Time Provider [place]  05/28/2020 10:15 AM Edrick Kins, DPM [facility]  08/31/2020  9:40 AM Caryn Section Kirstie Peri, MD [facility]  He declined flu vaccine today.            Mila Merry, MD  [facility] [phone] (phone) [phone] (fax)  [facility]

## 2020-03-13 ENCOUNTER — Other Ambulatory Visit: Payer: Self-pay | Admitting: Family Medicine

## 2020-03-13 DIAGNOSIS — G629 Polyneuropathy, unspecified: Secondary | ICD-10-CM

## 2020-03-13 NOTE — Telephone Encounter (Signed)
Copied from CRM [id number]. Topic: Quick Communication - Rx Refill/Question >> Mar 13, 2020  3:33 PM Jaquita Rector A wrote: Medication: HYDROcodone-acetaminophen (NORCO) 7.5-325 MG tablet   Has the patient contacted their pharmacy? Yes.   (Agent: If no, request that the patient contact the pharmacy for the refill.) (Agent: If yes, when and what did the pharmacy advise?)  Preferred Pharmacy (with phone number or street name): [facility] [address] - [place], [place] - [address]  Phone:  [phone] Fax:  [phone]     Agent: Please be advised that RX refills may take up to 3 business days. We ask that you follow-up with your pharmacy.

## 2020-03-13 NOTE — Telephone Encounter (Signed)
Requested medication (s) are due for refill today: yes  Requested medication (s) are on the active medication list: yes   Last refill:  02/03/20 #120 0 refills   Future visit scheduled: yes   Notes to clinic:  not delegated per protocol     Requested Prescriptions  Pending Prescriptions Disp Refills   HYDROcodone-acetaminophen (NORCO) 7.5-325 MG tablet 120 tablet 0    Sig: 1/2-1 tablet up to four times a day as needed      Not Delegated - Analgesics:  Opioid Agonist Combinations Failed - 03/13/2020  3:54 PM      Failed - This refill cannot be delegated      Failed - Urine Drug Screen completed in last 360 days      Passed - Valid encounter within last 6 months    Recent Outpatient Visits           Yesterday Pre-diabetes   [facility] Malva Limes, MD   2 months ago Exudative tonsillitis   [facility] Chrismon, Jodell Cipro, PA-C   6 months ago Annual physical exam   [facility] Malva Limes, MD   1 year ago Pre-diabetes   [facility] Malva Limes, MD   1 year ago Essential hypertension   [facility] Malva Limes, MD       Future Appointments             In 5 months Fisher, Demetrios Isaacs, MD [facility], [facility]

## 2020-03-14 ENCOUNTER — Other Ambulatory Visit: Payer: Self-pay | Admitting: Family Medicine

## 2020-03-14 DIAGNOSIS — I10 Essential (primary) hypertension: Secondary | ICD-10-CM

## 2020-03-14 MED ORDER — HYDROCODONE-ACETAMINOPHEN 7.5-325 MG PO TABS: ORAL_TABLET | ORAL | 0 refills | Status: DC

## 2020-04-12 ENCOUNTER — Other Ambulatory Visit: Payer: Self-pay | Admitting: Family Medicine

## 2020-04-12 DIAGNOSIS — G629 Polyneuropathy, unspecified: Secondary | ICD-10-CM

## 2020-04-12 MED ORDER — HYDROCODONE-ACETAMINOPHEN 7.5-325 MG PO TABS: ORAL_TABLET | ORAL | 0 refills | Status: DC

## 2020-04-12 NOTE — Telephone Encounter (Signed)
Requested medication (s) are due for refill today: yes  Requested medication (s) are on the active medication list: yes  Last refill:  03/14/20 #120  Future visit scheduled: yes  Notes to clinic:  Please review for refill. Refill not delegated per protocol.     Requested Prescriptions  Pending Prescriptions Disp Refills   HYDROcodone-acetaminophen (NORCO) 7.5-325 MG tablet 120 tablet 0    Sig: 1/2-1 tablet up to four times a day as needed      Not Delegated - Analgesics:  Opioid Agonist Combinations Failed - 04/12/2020  3:01 PM      Failed - This refill cannot be delegated      Failed - Urine Drug Screen completed in last 360 days      Passed - Valid encounter within last 6 months    Recent Outpatient Visits           1 month ago Pre-diabetes   [facility] Birdie Sons, MD   3 months ago Exudative tonsillitis   [place], PA-C   7 months ago Annual physical exam   [facility] Birdie Sons, MD   1 year ago Pre-diabetes   [facility] Birdie Sons, MD   1 year ago Essential hypertension   [facility] Birdie Sons, MD       Future Appointments             In 4 months Fisher, Kirstie Peri, MD [facility], [place]

## 2020-04-12 NOTE — Telephone Encounter (Signed)
Copied from [place] [id number]. Topic: Quick Communication - Rx Refill/Question >> Apr 12, 2020  2:18 PM Leward Quan A wrote: Medication: HYDROcodone-acetaminophen ([place]) 7.5-325 MG tablet   Has the patient contacted their pharmacy? No. (Agent: If no, request that the patient contact the pharmacy for the refill.) (Agent: If yes, when and what did the pharmacy advise?)  Preferred Pharmacy (with phone number or street name): [place]  Phone:  [phone] Fax:  [phone]     Agent: Please be advised that RX refills may take up to 3 business days. We ask that you follow-up with your pharmacy.

## 2020-04-12 NOTE — Telephone Encounter (Signed)
Requested medication (s) are due for refill today: yes  Requested medication (s) are on the active medication list: yes  Last refill:  01/08/20  Future visit scheduled: yes  Notes to clinic:  Please review for refill. No protocol to delegate refill    Requested Prescriptions  Pending Prescriptions Disp Refills   terbinafine (LAMISIL) 250 MG tablet [Pharmacy Med Name: TERBINAFINE HCL 250 MG TABLET] 14 tablet 2    Sig: TAKE TWO TABLETS BY MOUTH DAILY FOR 7 DAYS OF EACH MONTH      Off-Protocol Failed - 04/12/2020  2:16 PM      Failed - Medication not assigned to a protocol, review manually.      Passed - Valid encounter within last 12 months    Recent Outpatient Visits           1 month ago Pre-diabetes   [facility] Birdie Sons, MD   3 months ago Exudative tonsillitis   [place], PA-C   7 months ago Annual physical exam   [facility] Birdie Sons, MD   1 year ago Pre-diabetes   [facility] Birdie Sons, MD   1 year ago Essential hypertension   [facility] Birdie Sons, MD       Future Appointments             In 4 months Fisher, Kirstie Peri, MD [facility], [place]

## 2020-04-23 ENCOUNTER — Encounter: Payer: Self-pay | Admitting: Podiatry

## 2020-04-23 ENCOUNTER — Other Ambulatory Visit: Payer: Self-pay

## 2020-04-23 ENCOUNTER — Ambulatory Visit (INDEPENDENT_AMBULATORY_CARE_PROVIDER_SITE_OTHER): Payer: Medicare Other | Admitting: Podiatry

## 2020-04-23 DIAGNOSIS — M7672 Peroneal tendinitis, left leg: Secondary | ICD-10-CM | POA: Diagnosis not present

## 2020-04-23 DIAGNOSIS — M14672 Charcot's joint, left ankle and foot: Secondary | ICD-10-CM | POA: Diagnosis not present

## 2020-04-23 DIAGNOSIS — G8929 Other chronic pain: Secondary | ICD-10-CM

## 2020-04-23 DIAGNOSIS — E1143 Type 2 diabetes mellitus with diabetic autonomic (poly)neuropathy: Secondary | ICD-10-CM | POA: Diagnosis not present

## 2020-04-23 DIAGNOSIS — M25572 Pain in left ankle and joints of left foot: Secondary | ICD-10-CM

## 2020-04-23 MED ORDER — BETAMETHASONE SOD PHOS & ACET 6 (3-3) MG/ML IJ SUSP
3.0000 mg | Freq: Once | INTRAMUSCULAR | Status: AC
  Administered 2020-04-23: 3 mg via INTRA_ARTICULAR

## 2020-04-23 NOTE — Progress Notes (Signed)
   Subjective: 65 year old male with PMHx of T2DM presenting today for follow up evaluation Charcot neuropathy of the left midfoot and severe peripheral neuropathy secondary to DM.  Patient continues to wear the Tri-Lock ankle brace that was dispensed previously here in the office. He has been taking Lyrica as directed as well. He denies any new concerns or complaints.   Patient continues to suffer from chronic pain and tenderness especially to the left lower extremity.  He also suffers from severe peripheral neuropathy bilateral lower extremity secondary to diabetes mellitus.  Today there is a new area of pain associated to the lateral aspect of the left foot. patient is here for further evaluation and treatment.   Past Medical History:  Diagnosis Date  . Anxiety   . Hypertension   . Plantar fasciitis 08/04/2015     Objective: Physical Exam General: The patient is alert and oriented x3 in no acute distress.  Dermatology: Skin is warm, dry and supple bilateral lower extremities. Negative for open lesions or macerations bilateral.   Vascular: Left ankle warm compared to right.  Edema appears to be improved to the left lower extremity.. Dorsalis Pedis and Posterior Tibial pulses palpable bilateral.  Capillary fill time is immediate to all digits.  Neurological: Epicritic and protective threshold diminished bilateral.  Severe peripheral lower extremity neuropathy noted bilateral lower extremities.  Musculoskeletal: Tenderness to palpation to the fifth metatarsal tubercle at the insertion of the peroneal tendon left as well as to the lateral left ankle. All other joints range of motion within normal limits bilateral. Strength 5/5 in all groups bilateral.  Pain on palpation also noted to the right first metatarsal cuneiform joint and along first metatarsal.  Assessment: 1. Charcot neuropathy left midfoot - stable 2. Severe peripheral neuropathy secondary to DM - improved  3.  Insertional  peroneal tendinitis fifth metatarsal tubercle left  Plan of Care:  1. Patient evaluated.   2. Continue using OTC ankle brace.  3. Continue taking Lyrica BID.  4. Recommended good shoe gear.  5.  Injection of 0.5 cc Celestone Soluspan injected along the peroneal tendon sheath left lower extremity 6.  Patient states that he received documentation today stating from his disability insurance that I personally spoke with a physician representing his [employer] disability insurance, regarding the specific limitations of the patient.  I do not have any recollection of speaking peer-to-peer with another physician regarding this patient.  This was documented 01/17/2020. 7.  Continue restrictions of work.  Given the patient's conditions I do not believe he is in a position to perform any strenuous job activities including lifting pulling pushing or carrying any amount of weight.   8. Return to clinic annually  *Patient is a pipe fitter.   Edrick Kins, DPM [facility]  Dr. Edrick Kins, DPM    [address], [place] [address]                Office [phone]  Fax [phone]

## 2020-05-08 ENCOUNTER — Other Ambulatory Visit: Payer: Self-pay

## 2020-05-08 DIAGNOSIS — G629 Polyneuropathy, unspecified: Secondary | ICD-10-CM

## 2020-05-08 MED ORDER — PREGABALIN 225 MG PO CAPS
225.0000 mg | ORAL_CAPSULE | Freq: Three times a day (TID) | ORAL | 1 refills | Status: DC
Start: 2020-05-08 — End: 2020-11-21

## 2020-05-08 NOTE — Telephone Encounter (Signed)
Please review. Thanks!  

## 2020-05-08 NOTE — Telephone Encounter (Signed)
Patient had a refill left on the [place] medication assistance program but his year ended on 05/05/20.   So he is going to get this filled at [facility].  Please send in refills for Lyrica 225 mg. 1 po tid

## 2020-05-09 NOTE — Telephone Encounter (Signed)
Signing off encounter.  

## 2020-05-11 ENCOUNTER — Other Ambulatory Visit: Payer: Self-pay

## 2020-05-11 DIAGNOSIS — G629 Polyneuropathy, unspecified: Secondary | ICD-10-CM

## 2020-05-11 MED ORDER — HYDROCODONE-ACETAMINOPHEN 7.5-325 MG PO TABS: ORAL_TABLET | ORAL | 0 refills | Status: DC

## 2020-05-11 NOTE — Telephone Encounter (Signed)
Patient is requesting refill on Hydrocodone 7.5 mg.-325 mg. To be sent to [facility]

## 2020-05-21 ENCOUNTER — Ambulatory Visit: Payer: No Typology Code available for payment source | Admitting: Podiatry

## 2020-05-28 ENCOUNTER — Ambulatory Visit: Payer: No Typology Code available for payment source | Admitting: Podiatry

## 2020-06-06 ENCOUNTER — Other Ambulatory Visit: Payer: Self-pay | Admitting: Family Medicine

## 2020-06-06 DIAGNOSIS — G629 Polyneuropathy, unspecified: Secondary | ICD-10-CM

## 2020-06-06 NOTE — Telephone Encounter (Signed)
Requested medication (s) are due for refill today: yes  Requested medication (s) are on the active medication list: yes  Last refill:  05/12/2020  Future visit scheduled: yes  Notes to clinic:  this refill cannot be delegated    Requested Prescriptions  Pending Prescriptions Disp Refills   traMADol (ULTRAM) 50 MG tablet [Pharmacy Med Name: traMADol HCL 50MG  TABLET] 90 tablet     Sig: TAKE ONE TABLET BY MOUTH THREE TIMES A DAY AS NEEDED      Not Delegated - Analgesics:  Opioid Agonists Failed - 06/06/2020 11:55 AM      Failed - This refill cannot be delegated      Failed - Urine Drug Screen completed in last 360 days      Passed - Valid encounter within last 6 months    Recent Outpatient Visits           2 months ago [place], Donald E, MD   5 months ago Exudative tonsillitis   [facility] [place], Vickki Muff, PA-C   9 months ago Annual physical exam   [facility] Birdie Sons, MD   1 year ago Pre-diabetes   [facility] Birdie Sons, MD   1 year ago Essential hypertension   [facility] Birdie Sons, MD       Future Appointments             In 2 months Fisher, Kirstie Peri, MD [facility], [place]

## 2020-06-11 ENCOUNTER — Other Ambulatory Visit: Payer: Self-pay

## 2020-06-11 DIAGNOSIS — G629 Polyneuropathy, unspecified: Secondary | ICD-10-CM

## 2020-06-11 NOTE — Telephone Encounter (Signed)
L.O.V. was on 03/12/2020 and upcoming appointment on 08/31/2020.

## 2020-06-11 NOTE — Telephone Encounter (Signed)
Patient asking for refill on Hydrocodone 7.5-325 mg. To be sent to [facility].

## 2020-06-12 MED ORDER — HYDROCODONE-ACETAMINOPHEN 7.5-325 MG PO TABS
ORAL_TABLET | ORAL | 0 refills | Status: DC
Start: 2020-06-12 — End: 2020-07-09

## 2020-06-13 ENCOUNTER — Other Ambulatory Visit: Payer: Self-pay | Admitting: Family Medicine

## 2020-06-17 ENCOUNTER — Other Ambulatory Visit: Payer: Self-pay | Admitting: Family Medicine

## 2020-06-18 ENCOUNTER — Telehealth: Payer: Self-pay

## 2020-06-18 NOTE — Telephone Encounter (Signed)
I called pt and he said he did not need another refill as he was cutting pravastatin pills in half. Patient says he has enough to last him until next OV.

## 2020-06-18 NOTE — Telephone Encounter (Signed)
[facility] faxed refill request for the following medications:  pravastatin (PRAVACHOL) 40 MG tablet   Please advise.

## 2020-07-09 ENCOUNTER — Other Ambulatory Visit: Payer: Self-pay

## 2020-07-09 DIAGNOSIS — G629 Polyneuropathy, unspecified: Secondary | ICD-10-CM

## 2020-07-09 NOTE — Telephone Encounter (Signed)
Patient came by office asking for refill on Hydrocodone-Acet 7.5mg . -325 mg. To be sent to [facility].

## 2020-07-10 MED ORDER — HYDROCODONE-ACETAMINOPHEN 7.5-325 MG PO TABS
ORAL_TABLET | ORAL | 0 refills | Status: DC
Start: 2020-07-10 — End: 2020-08-14

## 2020-07-12 ENCOUNTER — Other Ambulatory Visit: Payer: Self-pay | Admitting: Family Medicine

## 2020-07-12 NOTE — Telephone Encounter (Signed)
Notes to clinic:  Review for refill Script was only filled for 14 tabs  Review for refill for full supply   Requested Prescriptions  Pending Prescriptions Disp Refills   pravastatin (PRAVACHOL) 40 MG tablet [Pharmacy Med Name: PRAVASTATIN SODIUM 40 MG TAB] 14 tablet     Sig: TAKE ONE TABLET BY MOUTH DAILY      Cardiovascular:  Antilipid - Statins Failed - 07/12/2020 11:28 AM      Failed - Total Cholesterol in normal range and within 360 days    Cholesterol, Total  Date Value Ref Range Status  09/05/2019 97 (L) 100 - 199 mg/dL Final   Cholesterol  Date Value Ref Range Status  06/04/2012 85 0 - 200 mg/dL Final          Failed - LDL in normal range and within 360 days    Ldl Cholesterol, Calc  Date Value Ref Range Status  06/04/2012 39 0 - 100 mg/dL Final   LDL Chol Calc (NIH)  Date Value Ref Range Status  09/05/2019 52 0 - 99 mg/dL Final          Failed - HDL in normal range and within 360 days    HDL Cholesterol  Date Value Ref Range Status  06/04/2012 24 (L) 40 - 60 mg/dL Final   HDL  Date Value Ref Range Status  09/05/2019 34 (L) >39 mg/dL Final          Passed - Triglycerides in normal range and within 360 days    Triglycerides  Date Value Ref Range Status  09/05/2019 42 0 - 149 mg/dL Final  06/04/2012 109 0 - 200 mg/dL Final          Passed - Patient is not pregnant      Passed - Valid encounter within last 12 months    Recent Outpatient Visits           4 months ago [place], Donald E, MD   6 months ago Exudative tonsillitis   [facility], Vickki Muff, PA-C   10 months ago Annual physical exam   [facility] Birdie Sons, MD   1 year ago [place], Kirstie Peri, MD   1 year ago Essential hypertension   [place], Kirstie Peri, MD       Future Appointments             In 1 month Fisher, Kirstie Peri, MD  [facility]               diazepam (VALIUM) 10 MG tablet [Pharmacy Med Name: DIAZEPAM 10 MG TABLET] 30 tablet     Sig: TAKE ONE TABLET BY MOUTH DAILY AS NEEDED FOR ANXIETY      Not Delegated - Psychiatry:  Anxiolytics/Hypnotics Failed - 07/12/2020 11:28 AM      Failed - This refill cannot be delegated      Failed - Urine Drug Screen completed in last 360 days      Passed - Valid encounter within last 6 months    Recent Outpatient Visits           4 months ago [place], Donald E, MD   6 months ago Exudative tonsillitis   [place], Vickki Muff, PA-C   10 months ago Annual physical exam   [facility] Birdie Sons, MD  1 year ago Pre-diabetes   [place], Kirstie Peri, MD   1 year ago Essential hypertension   [place], Kirstie Peri, MD       Future Appointments             In 1 month Fisher, Kirstie Peri, MD [facility], [place]

## 2020-07-12 NOTE — Telephone Encounter (Signed)
Requested medication (s) are due for refill today: yes  Requested medication (s) are on the active medication list: yes  Last refill:  07/03/20  Future visit scheduled:yes  Notes to clinic:  please verify dispense amount; see pharmacy comment below Requested Refill  meloxicam (MOBIC) 15 MG tablet [Pharmacy Med Name: MELOXICAM 15 MG TABLET]  TAKE ONE TABLET BY MOUTH DAILY  Dispense: 6 tablet         Requested Prescriptions  Pending Prescriptions Disp Refills   meloxicam (MOBIC) 15 MG tablet [Pharmacy Med Name: MELOXICAM 15 MG TABLET] 6 tablet     Sig: TAKE ONE TABLET BY MOUTH DAILY      Analgesics:  COX2 Inhibitors Failed - 07/12/2020  2:16 PM      Failed - HGB in normal range and within 360 days    Hemoglobin  Date Value Ref Range Status  09/05/2019 12.4 (L) 13.0 - 17.7 g/dL Final          Passed - Cr in normal range and within 360 days    Creat  Date Value Ref Range Status  11/20/2016 0.90 0.70 - 1.33 mg/dL Final    Comment:    For patients >41 years of age, the reference limit for Creatinine is approximately 13% higher for people identified as African-American. .    Creatinine, Ser  Date Value Ref Range Status  09/05/2019 1.01 0.76 - 1.27 mg/dL Final          Passed - Patient is not pregnant      Passed - Valid encounter within last 12 months    Recent Outpatient Visits           4 months ago Pre-diabetes   [facility] Birdie Sons, MD   6 months ago Exudative tonsillitis   [place], Vickki Muff, PA-C   10 months ago Annual physical exam   [facility] Birdie Sons, MD   1 year ago Pre-diabetes   [facility] Birdie Sons, MD   1 year ago Essential hypertension   [facility] Birdie Sons, MD       Future Appointments             In 1 month Fisher, Kirstie Peri, MD [facility], [place]

## 2020-07-14 ENCOUNTER — Other Ambulatory Visit: Payer: Self-pay | Admitting: Family Medicine

## 2020-07-14 NOTE — Telephone Encounter (Signed)
Requested medication (s) are due for refill today: yes  Requested medication (s) are on the active medication list: yes  Last refill:  04/12/20  Future visit scheduled: yes  Notes to clinic:  med not assigned to a protocol   Requested Prescriptions  Pending Prescriptions Disp Refills   terbinafine (LAMISIL) 250 MG tablet [Pharmacy Med Name: TERBINAFINE HCL 250 MG TABLET] 14 tablet 2    Sig: TAKE TWO TABLETS BY MOUTH FOR SEVEN DAYS OF EACH MONTH      Off-Protocol Failed - 07/14/2020  2:29 PM      Failed - Medication not assigned to a protocol, review manually.      Passed - Valid encounter within last 12 months    Recent Outpatient Visits           4 months ago Pre-diabetes   [facility] Birdie Sons, MD   6 months ago Exudative tonsillitis   [place], PA-C   10 months ago Annual physical exam   [facility] Birdie Sons, MD   1 year ago Pre-diabetes   [facility] Birdie Sons, MD   1 year ago Essential hypertension   [facility] Birdie Sons, MD       Future Appointments             In 1 month Fisher, Kirstie Peri, MD [facility], [place]

## 2020-07-24 IMAGING — CR DG ORBITS FOR FOREIGN BODY
1 series · 2 of 2 positions shown · non-contrast
Comparison: CT head dated June 03, 2012.

CLINICAL DATA: Metal working/exposure; clearance prior to MRI

EXAM:
ORBITS FOR FOREIGN BODY - 2 VIEW

[Series 1: dg eye foreign body · 0.14mm/px · 2 of 2 slices shown]
[im 1/2]
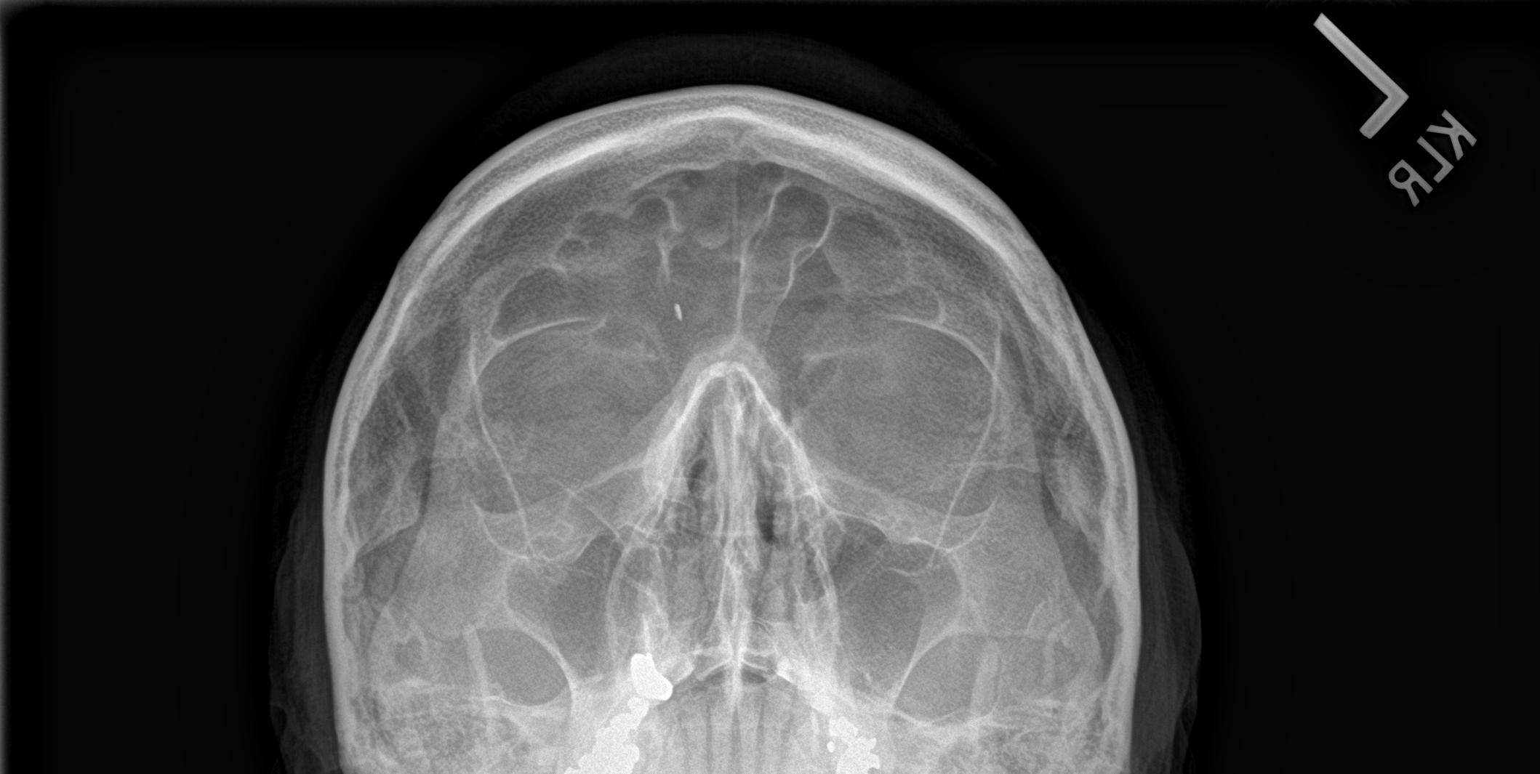
[im 2/2]
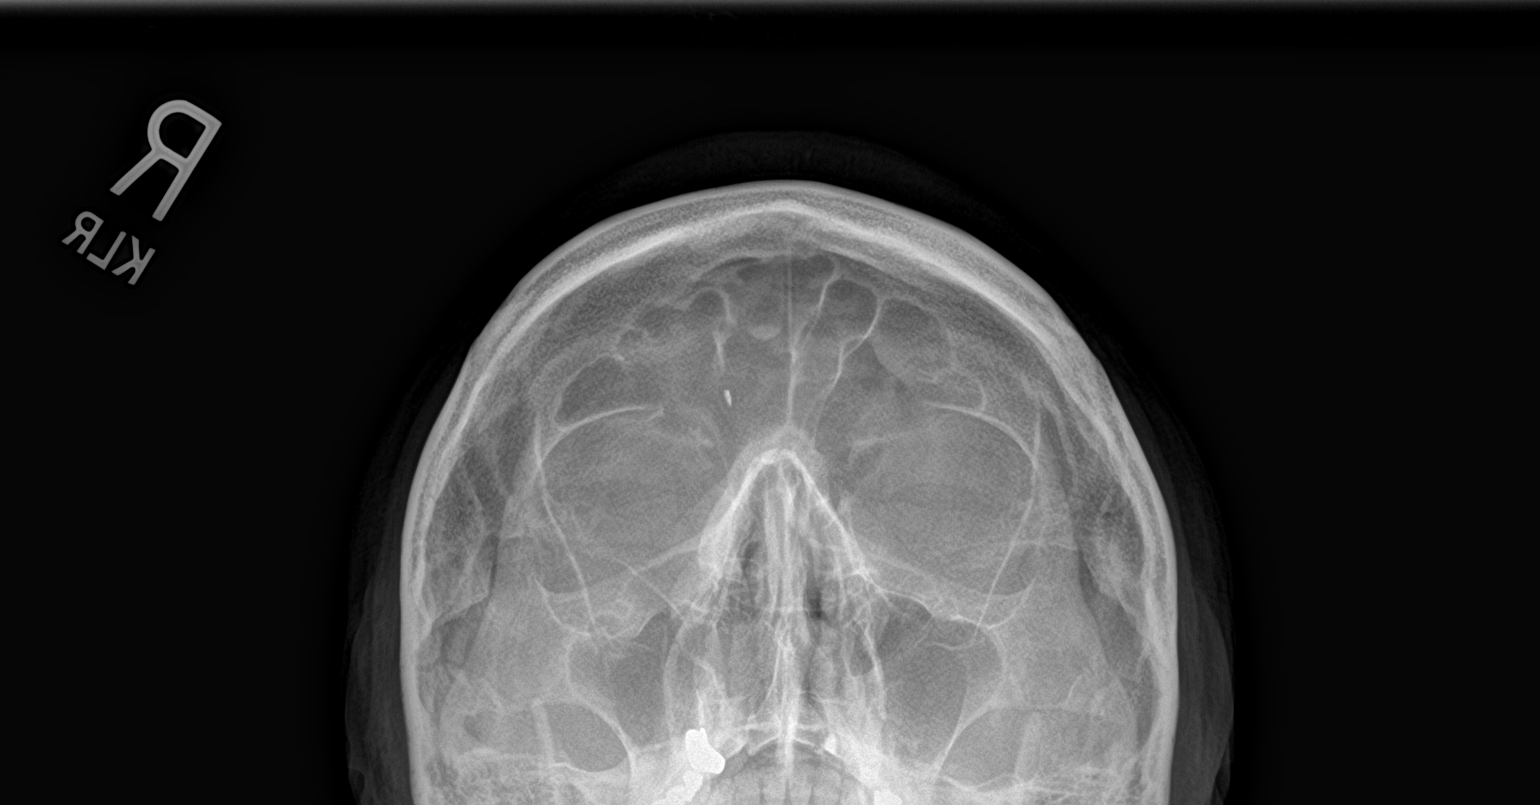

[2 of 2 positions shown; findings below may reference images not displayed]

FINDINGS: There is a punctate radiopaque density overlying the inferior right
frontal sinus, located within the superficial soft tissues overlying
the right nasal bridge when compared to prior CT from 4962. No
significant bone abnormality identified.
IMPRESSION: Small punctate foreign body overlying the right nasal bridge, likely
safe for MRI.

## 2020-08-13 IMAGING — MR MR LUMBAR SPINE W/O CM
4 of 5 series · 18 of 48 positions shown · non-contrast
Comparison: CT abdomen 06/03/2012

CLINICAL DATA: Right hip pain, worse when supine.

EXAM:
MRI LUMBAR SPINE WITHOUT CONTRAST
TECHNIQUE: Multiplanar, multisequence MR imaging of the lumbar spine was
performed. No intravenous contrast was administered.

[Series 3: T1 · sagittal · 4.0mm · 0.51mm/px · 3 of 24 slices shown (1 of 2)]
[im 4/24]
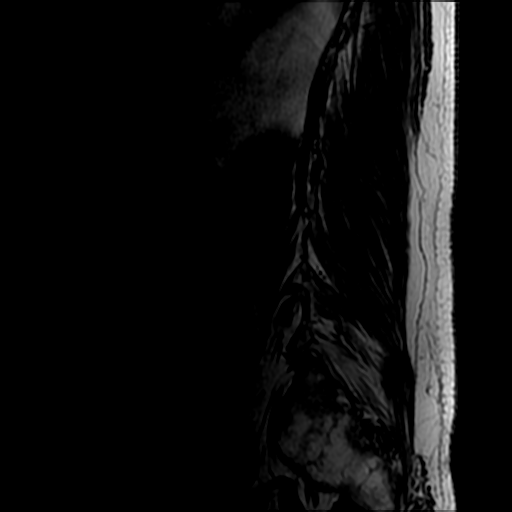
[im 14/24]
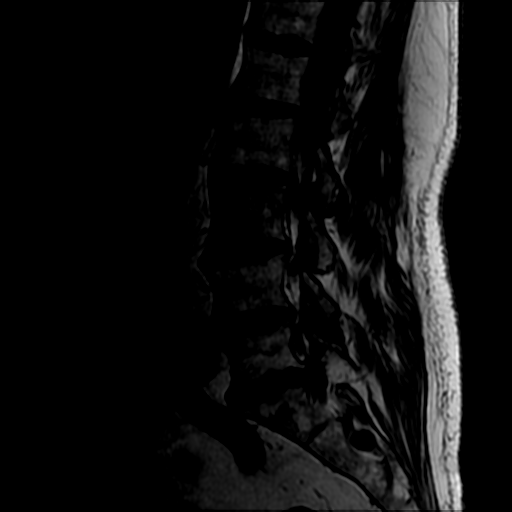
[im 20/24]
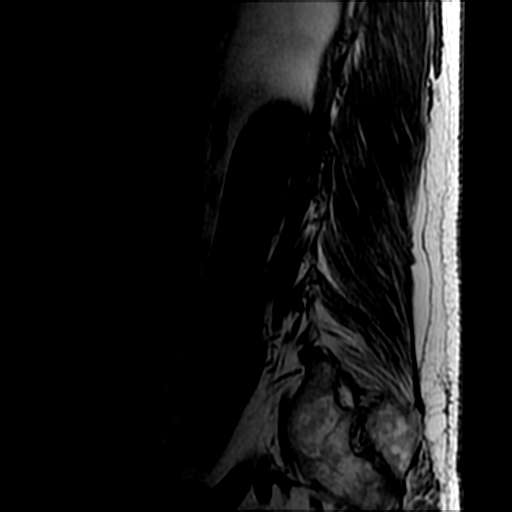

[Series 4: T2 post-contrast · sagittal · 4.0mm · 0.51mm/px · 6 of 16 slices shown]
[im 1/16]
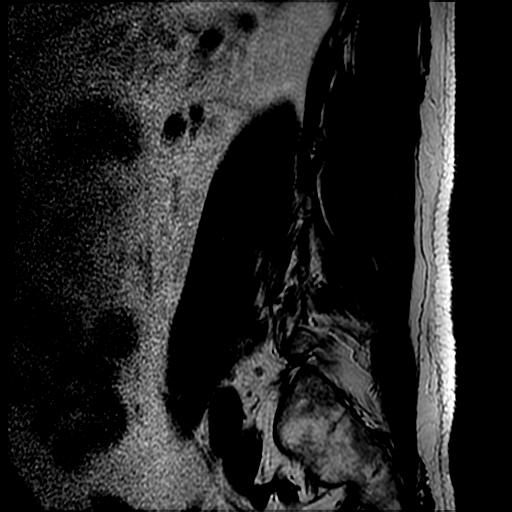
[im 4/16]
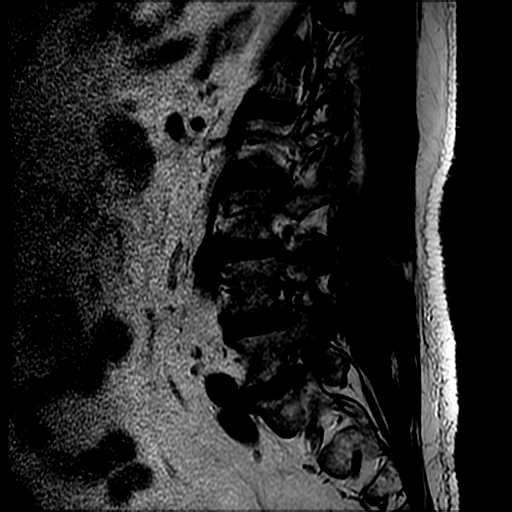
[im 7/16]
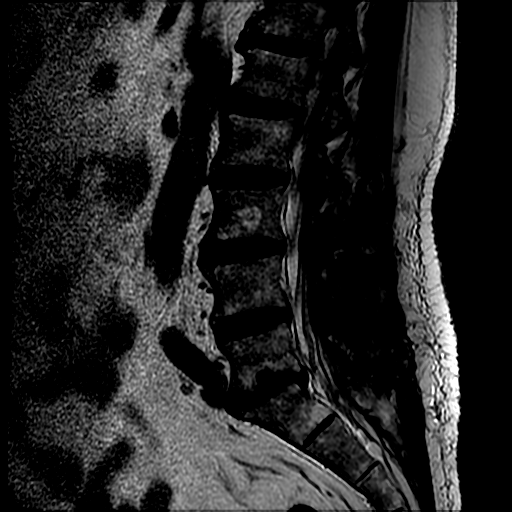
[im 10/16]
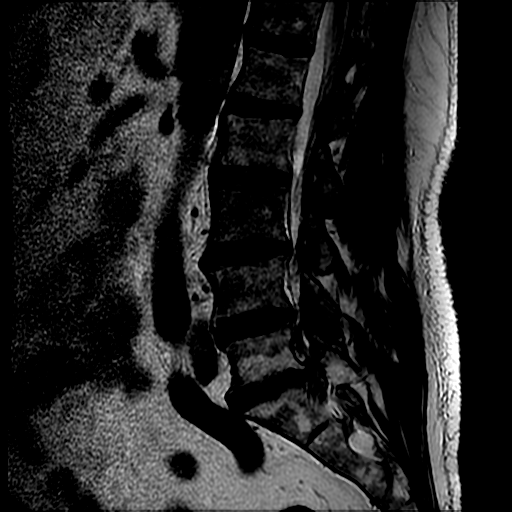
[im 13/16]
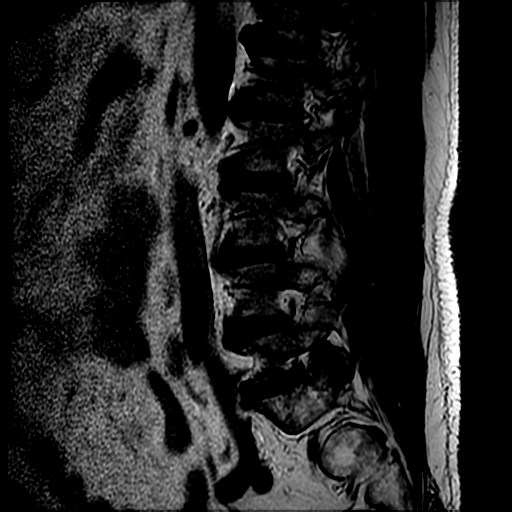
[im 16/16]
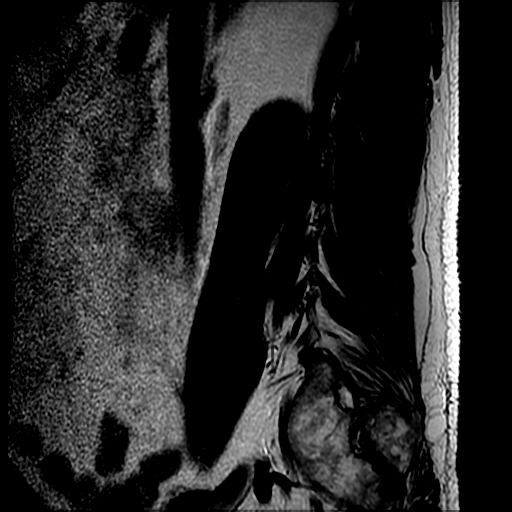

[Series 6: T2 · axial · 4.0mm · 0.39mm/px · z∈[-17,+168]mm · 6 of 40 slices shown]
[im 1/40]
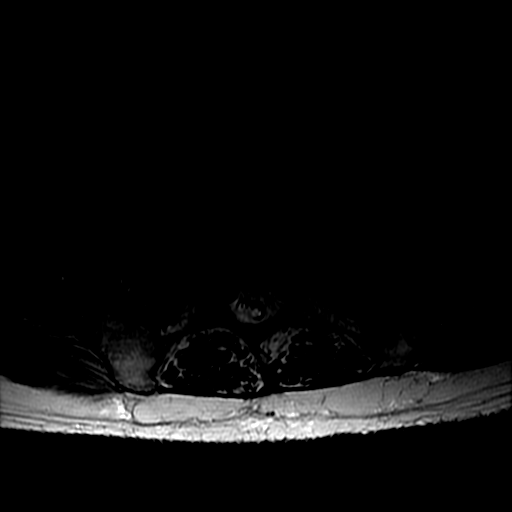
[im 7/40]
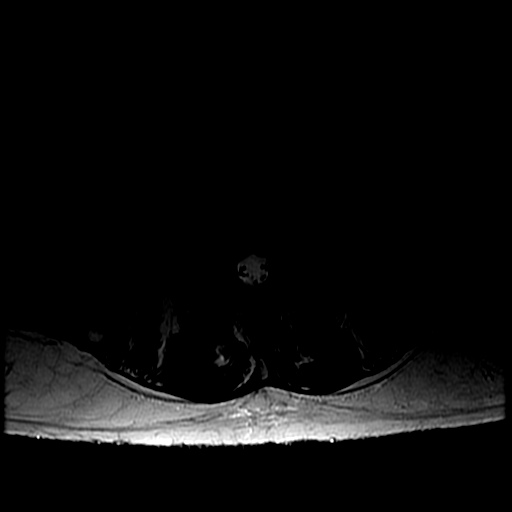
[im 13/40]
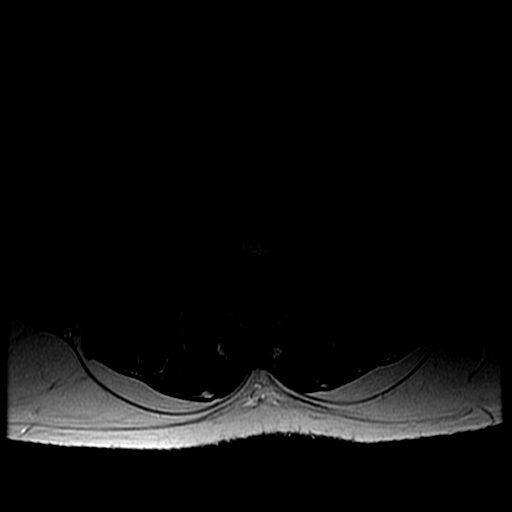
[im 19/40]
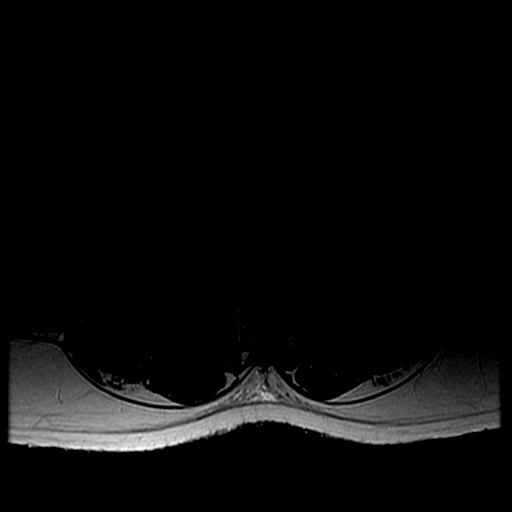
[im 22/40]
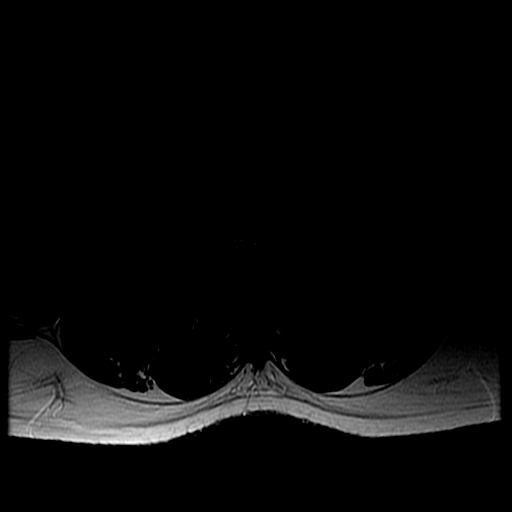
[im 34/40]
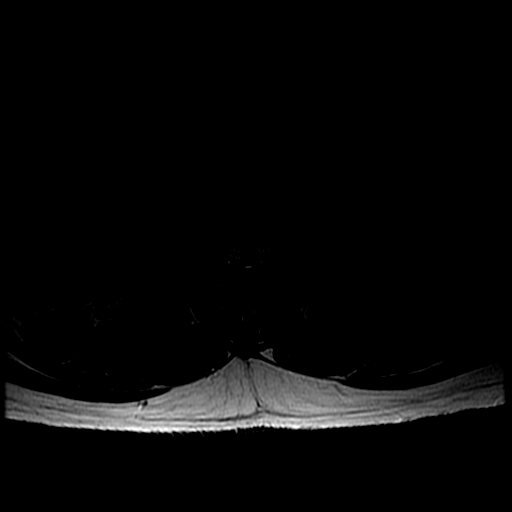

[Series 7: T1 · axial · 4.0mm · 0.39mm/px · z∈[+13,+168]mm · 3 of 40 slices shown (2 of 2)]
[im 7/40]
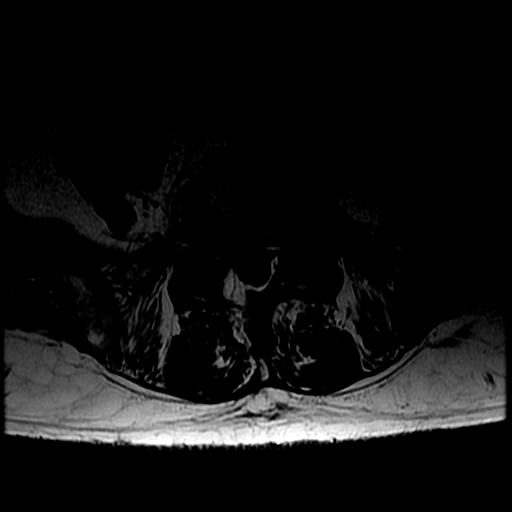
[im 22/40]
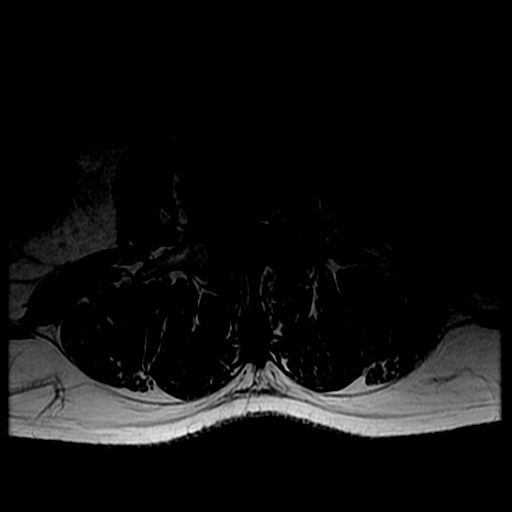
[im 34/40]
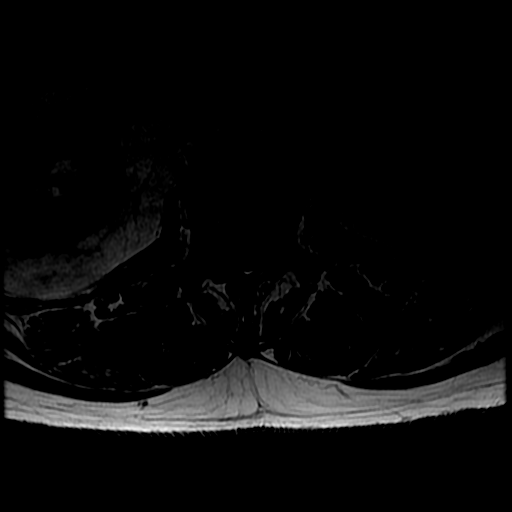

[18 of 48 positions shown; findings below may reference images not displayed]

FINDINGS: Segmentation:  5 lumbar type vertebral bodies.

Alignment:  Normal

Vertebrae: No primary bone pathology. Discogenic endplate changes at
L5-S1, including some edema, which could be associated with back
pain.

Conus medullaris and cauda equina: Conus extends to the L1 level.
Conus and cauda equina appear normal.

Paraspinal and other soft tissues: Negative. Simple cyst of the
right kidney.

Disc levels:

No significant finding at L3-4 or above. Minimal disc bulges. No
canal or foraminal stenosis.

L4-5: Mild bulging of the disc slightly more prominent towards the
left. Mild facet degeneration and hypertrophy. Minimal narrowing of
the left lateral recess and intervertebral foramen on the left, not
likely compressive.

L5-S1: Previous right hemilaminectomy. Chronic disc degeneration
with loss of disc height. Endplate osteophytes and bulging of the
disc with a small protrusion in the right posterolateral direction.
Mild facet hypertrophy. Mild bilateral foraminal narrowing without
definite compression of the exiting L5 nerves. Osteophyte in disc
material contact the right S1 nerve in the subarticular lateral
recess and could possibly irritate that structure. S1 nerve
compression is not demonstrated.
IMPRESSION: At L5-S1, there has been a previous right hemilaminectomy procedure.
There is chronic disc space narrowing with endplate changes
including some edema which could be associated with low back pain.
Endplate osteophytes and bulging of the disc with a small focal
right posterolateral disc protrusion, contacting the right S1 nerve.
S1 nerve compression is not demonstrated, but the nerve could be
irritated. There is mild bilateral foraminal narrowing, slightly
worse on the right, but without distinct compression of the exiting
L5 nerves.

## 2020-08-14 ENCOUNTER — Other Ambulatory Visit: Payer: Self-pay | Admitting: Family Medicine

## 2020-08-14 DIAGNOSIS — G629 Polyneuropathy, unspecified: Secondary | ICD-10-CM

## 2020-08-14 MED ORDER — HYDROCODONE-ACETAMINOPHEN 7.5-325 MG PO TABS: ORAL_TABLET | ORAL | 0 refills | Status: DC

## 2020-08-14 NOTE — Telephone Encounter (Signed)
Requested medication (s) are due for refill today: yes  Requested medication (s) are on the active medication list: yes  Last refill:  07/10/20 #120 0 refills  Future visit scheduled: yes in 2 weeks   Notes to clinic:  not delegated per protocol     Requested Prescriptions  Pending Prescriptions Disp Refills   HYDROcodone-acetaminophen (DISH) 7.5-325 MG tablet 120 tablet 0    Sig: 1/2-1 tablet up to four times a day as needed      Not Delegated - Analgesics:  Opioid Agonist Combinations Failed - 08/14/2020  4:12 PM      Failed - This refill cannot be delegated      Failed - Urine Drug Screen completed in last 360 days      Passed - Valid encounter within last 6 months    Recent Outpatient Visits           5 months ago [place], Donald E, MD   7 months ago Exudative tonsillitis   [facility] [place], Vickki Muff, PA-C   11 months ago Annual physical exam   [facility] Birdie Sons, MD   1 year ago Pre-diabetes   [facility] Birdie Sons, MD   1 year ago Essential hypertension   [facility] Birdie Sons, MD       Future Appointments             In 2 weeks Fisher, Kirstie Peri, MD [facility], [place]

## 2020-08-14 NOTE — Telephone Encounter (Signed)
Medication Refill - Medication:   HYDROcodone-acetaminophen (NORCO) 7.5-325 MG tablet   Has the patient contacted their pharmacy? Yes.  controlled substance needs doctor approval.   Preferred Pharmacy (with phone number or street name):   [place], [place] [facility]  [place] [address]  Phone: [phone] Fax: [phone]    Agent: Please be advised that RX refills may take up to 3 business days. We ask that you follow-up with your pharmacy.

## 2020-08-31 ENCOUNTER — Ambulatory Visit (INDEPENDENT_AMBULATORY_CARE_PROVIDER_SITE_OTHER): Payer: Medicare HMO | Admitting: Family Medicine

## 2020-08-31 ENCOUNTER — Encounter: Payer: Self-pay | Admitting: Family Medicine

## 2020-08-31 ENCOUNTER — Other Ambulatory Visit: Payer: Self-pay

## 2020-08-31 VITALS — BP 151/83 | HR 75 | Wt 239.0 lb

## 2020-08-31 DIAGNOSIS — I10 Essential (primary) hypertension: Secondary | ICD-10-CM

## 2020-08-31 DIAGNOSIS — R7303 Prediabetes: Secondary | ICD-10-CM

## 2020-08-31 DIAGNOSIS — K439 Ventral hernia without obstruction or gangrene: Secondary | ICD-10-CM | POA: Diagnosis not present

## 2020-08-31 DIAGNOSIS — D539 Nutritional anemia, unspecified: Secondary | ICD-10-CM | POA: Diagnosis not present

## 2020-08-31 DIAGNOSIS — Z125 Encounter for screening for malignant neoplasm of prostate: Secondary | ICD-10-CM

## 2020-08-31 LAB — POCT GLYCOSYLATED HEMOGLOBIN (HGB A1C): Hemoglobin A1C: 6 % — AB (ref 4.0–5.6)

## 2020-08-31 MED ORDER — AMLODIPINE BESYLATE 2.5 MG PO TABS: 2.5000 mg | ORAL_TABLET | Freq: Every day | ORAL | 1 refills | Status: DC

## 2020-08-31 NOTE — Progress Notes (Signed)
Established patient visit   Patient: Dustin Guerra   DOB: 09-06-1955   65 y.o. Male  MRN: [id number] Visit Date: 08/31/2020  Today's healthcare provider: Lelon Huh, MD   Chief Complaint  Patient presents with   Hyperglycemia   Subjective    HPI  Prediabetes, Follow-up  Lab Results  Component Value Date   HGBA1C 5.5 03/12/2020   HGBA1C 5.7 (H) 09/05/2019   HGBA1C 5.9 (A) 03/02/2019   GLUCOSE 128 (H) 09/05/2019   GLUCOSE 120 (H) 09/03/2018   GLUCOSE 101 (H) 08/24/2017    Last seen for for this6 months ago.  Management since that visit includes no changes. Current symptoms include none and have been stable.  Prior visit with dietician: no Current diet: Health diet   Pertinent Labs:    Component Value Date/Time   CHOL 97 (L) 09/05/2019 1007   CHOL 85 06/04/2012 0610   TRIG 42 09/05/2019 1007   TRIG 109 06/04/2012 0610   CHOLHDL 2.9 09/05/2019 1007   CREATININE 1.01 09/05/2019 1007   CREATININE 0.90 11/20/2016 1153    Wt Readings from Last 3 Encounters:  08/31/20 239 lb (108.4 kg)  03/12/20 226 lb (102.5 kg)  01/03/20 236 lb (107 kg)    -----------------------------------------------------------------------------------------  Lipid/Cholesterol, Follow-up  Last lipid panel Other pertinent labs  Lab Results  Component Value Date   CHOL 97 (L) 09/05/2019   HDL 34 (L) 09/05/2019   LDLCALC 52 09/05/2019   TRIG 42 09/05/2019   CHOLHDL 2.9 09/05/2019   Lab Results  Component Value Date   ALT 16 09/05/2019   AST 19 09/05/2019   PLT 137 (L) 09/05/2019   TSH 4.170 09/05/2019     He was last seen for this 3 months ago.  Management since that visit: Pt states he reduced pravastatin to 20mg  after hearing it is bad for him.  He reports excellent compliance with treatment. He is not having side effects.   Symptoms: No chest pain No chest pressure/discomfort  No dyspnea No lower extremity edema  No numbness or tingling of extremity No  orthopnea  No palpitations No paroxysmal nocturnal dyspnea  No speech difficulty No syncope   Current diet: in general, a "healthy" diet   Current exercise:  Exercises regulary.  The ASCVD Risk score Mikey Bussing DC Jr., et al., 2013) failed to calculate for the following reasons:   The valid total cholesterol range is 130 to 320 mg/dL  ---------------------------------------------------------------------------------------------------   He also has ventral hernia around old surgical incision that he states has been increasingly painful and bulging. He would like to have it repaired.    Medications: Outpatient Medications Prior to Visit  Medication Sig   aspirin 81 MG tablet Take by mouth.   Azelaic Acid 15 % FOAM Apply 1 application topically 2 (two) times daily.   cilostazol (PLETAL) 50 MG tablet TAKE ONE TABLET BY MOUTH TWICE A DAY   diazepam (VALIUM) 10 MG tablet TAKE ONE TABLET BY MOUTH DAILY AS NEEDED FOR ANXIETY   diclofenac Sodium (VOLTAREN) 1 % GEL Apply 2 g topically 4 (four) times daily.   docusate sodium (COLACE) 100 MG capsule Take 100 mg by mouth 2 (two) times daily.   EPINEPHrine 0.3 mg/0.3 mL IJ SOAJ injection as needed.    HYDROcodone-acetaminophen (NORCO) 7.5-325 MG tablet 1/2-1 tablet up to four times a day as needed   IRON PO Take by mouth.   lansoprazole (PREVACID) 30 MG capsule TAKE ONE CAPSULE BY MOUTH DAILY  AT 12 NOON   meclizine (ANTIVERT) 25 MG tablet Take 1 tablet (25 mg total) by mouth 3 (three) times daily as needed for dizziness.   meloxicam (MOBIC) 15 MG tablet Take 1 tablet (15 mg total) by mouth daily as needed.   pregabalin (LYRICA) 225 MG capsule Take 1 capsule (225 mg total) by mouth 3 (three) times daily.   ramipril (ALTACE) 10 MG capsule TAKE ONE CAPSULE BY MOUTH DAILY   sildenafil (VIAGRA) 100 MG tablet TAKE 0.5-1 TABLET BY MOUTH DAILY AS NEEDED FOR ERECTILE DYSFUNCTION   terbinafine (LAMISIL) 250 MG tablet TAKE TWO TABLETS BY MOUTH FOR SEVEN DAYS OF  EACH MONTH   traMADol (ULTRAM) 50 MG tablet TAKE ONE TABLET BY MOUTH THREE TIMES A DAY AS NEEDED   Multiple Vitamins-Minerals (CENTRUM SILVER) CHEW Chew by mouth. (Patient not taking: Reported on 08/31/2020)   pravastatin (PRAVACHOL) 40 MG tablet Take 1 tablet (40 mg total) by mouth daily. (Patient taking differently: Take 20 mg by mouth daily.)   No facility-administered medications prior to visit.    Review of Systems  Constitutional: Negative.   Respiratory: Negative.    Cardiovascular: Negative.   Gastrointestinal: Negative.   Neurological:  Negative for dizziness, syncope, light-headedness and headaches.      Objective    BP (!) 151/83 (BP Location: Right Arm, Patient Position: Sitting, Cuff Size: Large)   Pulse 75   Wt 239 lb (108.4 kg)   SpO2 97%   BMI 30.69 kg/m     Physical Exam   General: Appearance:    Obese male in no acute distress  Eyes:    PERRL, conjunctiva/corneas clear, EOM's intact       Lungs:     Clear to auscultation bilaterally, respirations unlabored  Heart:    Normal heart rate. Normal rhythm. No murmurs, rubs, or gallops.   Abd:   Small slightly tender ventral hernia to the right of old vertical surgical incision. No redness, no open open wounds or drainage.   Neurologic:   Awake, alert, oriented x 3. N       Results for orders placed or performed in visit on 08/31/20  POCT glycosylated hemoglobin (Hb A1C)  Result Value Ref Range   Hemoglobin A1C 6.0 (A) 4.0 - 5.6 %    Assessment & Plan     1. Pre-diabetes stable  2. Prostate cancer screening  - PSA Total (Reflex To Free) ([facility] only)  3. Essential hypertension  - CBC - Comprehensive metabolic panel - Lipid panel  Start  amLODipine (NORVASC) 2.5 MG tablet; Take 1 tablet (2.5 mg total) by mouth daily.  Dispense: 90 tablet; Refill: 1  4. Ventral hernia without obstruction or gangrene He would like to see surgeon to discuss repair       The entirety of the information documented  in the History of Present Illness, Review of Systems and Physical Exam were personally obtained by me. Portions of this information were initially documented by the CMA and reviewed by me for thoroughness and accuracy.     Lelon Huh, MD  [facility] [phone] (phone) [phone] (fax)  [place]

## 2020-09-01 LAB — CBC
Hematocrit: 36.1 % — ABNORMAL LOW (ref 37.5–51.0)
Hemoglobin: 11.9 g/dL — ABNORMAL LOW (ref 13.0–17.7)
MCH: 31.3 pg (ref 26.6–33.0)
MCHC: 33 g/dL (ref 31.5–35.7)
MCV: 95 fL (ref 79–97)
Platelets: 153 10*3/uL (ref 150–450)
RBC: 3.8 x10E6/uL — ABNORMAL LOW (ref 4.14–5.80)
RDW: 12.5 % (ref 11.6–15.4)
WBC: 3.2 10*3/uL — ABNORMAL LOW (ref 3.4–10.8)

## 2020-09-01 LAB — LIPID PANEL
Chol/HDL Ratio: 3.1 ratio (ref 0.0–5.0)
Cholesterol, Total: 123 mg/dL (ref 100–199)
HDL: 40 mg/dL (ref >39)
LDL Chol Calc (NIH): 64 mg/dL (ref 0–99)
Triglycerides: 103 mg/dL (ref 0–149)
VLDL Cholesterol Cal: 19 mg/dL (ref 5–40)

## 2020-09-01 LAB — COMPREHENSIVE METABOLIC PANEL
ALT: 17 IU/L (ref 0–44)
AST: 16 IU/L (ref 0–40)
Albumin/Globulin Ratio: 2.3 — ABNORMAL HIGH (ref 1.2–2.2)
Albumin: 4.6 g/dL (ref 3.8–4.8)
Alkaline Phosphatase: 127 IU/L — ABNORMAL HIGH (ref 44–121)
BUN/Creatinine Ratio: 20 (ref 10–24)
BUN: 17 mg/dL (ref 8–27)
Bilirubin Total: 0.2 mg/dL (ref 0.0–1.2)
CO2: 23 mmol/L (ref 20–29)
Calcium: 9 mg/dL (ref 8.6–10.2)
Chloride: 101 mmol/L (ref 96–106)
Creatinine, Ser: 0.85 mg/dL (ref 0.76–1.27)
Globulin, Total: 2 g/dL (ref 1.5–4.5)
Glucose: 151 mg/dL — ABNORMAL HIGH (ref 65–99)
Potassium: 4.6 mmol/L (ref 3.5–5.2)
Sodium: 137 mmol/L (ref 134–144)
Total Protein: 6.6 g/dL (ref 6.0–8.5)
eGFR: 98 mL/min/{1.73_m2} (ref >59)

## 2020-09-01 LAB — PSA TOTAL (REFLEX TO FREE): Prostate Specific Ag, Serum: 0.5 ng/mL (ref 0.0–4.0)

## 2020-09-05 ENCOUNTER — Encounter: Payer: Self-pay | Admitting: Family Medicine

## 2020-09-05 ENCOUNTER — Encounter: Payer: PRIVATE HEALTH INSURANCE | Admitting: Family Medicine

## 2020-09-05 ENCOUNTER — Telehealth: Payer: Self-pay

## 2020-09-05 DIAGNOSIS — D509 Iron deficiency anemia, unspecified: Secondary | ICD-10-CM | POA: Insufficient documentation

## 2020-09-05 NOTE — Telephone Encounter (Signed)
The main thing is to make sure he is not losing blood in stool so need to go OC-lyte. He can try to get more iron rich foods like dark green leafy vegetables. If his levels get too low we can also send of iron infusions, but it's not low enough to need that at this point.

## 2020-09-05 NOTE — Telephone Encounter (Signed)
Patient advised and verbalized understanding. OC light kit placed up front for pick up.

## 2020-09-05 NOTE — Telephone Encounter (Signed)
-----   Message from Birdie Sons, MD sent at 09/05/2020  7:59 AM EDT ----- Labs show is has iron deficient anemia. He needs to start taking OTC iron sulfate 325mg  once a day. He also needs to do OC-lyte to check for GI bleeding. Will need to recheck iron levels at his follow up in September.

## 2020-09-05 NOTE — Telephone Encounter (Signed)
Pt advised.    He has taking iron in the past and has caused severe abdominal pain (Where he had colon surgery) and constipation. He was wondering if there was another option.   Thanks,   -Mickel Baas

## 2020-09-06 LAB — B12 AND FOLATE PANEL
Folate: 3.4 ng/mL (ref >3.0)
Vitamin B-12: 779 pg/mL (ref 232–1245)

## 2020-09-06 LAB — SPECIMEN STATUS REPORT

## 2020-09-06 LAB — FERRITIN: Ferritin: 29 ng/mL — ABNORMAL LOW (ref 30–400)

## 2020-09-10 ENCOUNTER — Other Ambulatory Visit (INDEPENDENT_AMBULATORY_CARE_PROVIDER_SITE_OTHER): Payer: Medicare HMO

## 2020-09-10 ENCOUNTER — Other Ambulatory Visit: Payer: Self-pay

## 2020-09-10 DIAGNOSIS — G629 Polyneuropathy, unspecified: Secondary | ICD-10-CM

## 2020-09-10 DIAGNOSIS — K625 Hemorrhage of anus and rectum: Secondary | ICD-10-CM | POA: Diagnosis not present

## 2020-09-10 LAB — IFOBT (OCCULT BLOOD): IFOBT: NEGATIVE

## 2020-09-10 MED ORDER — HYDROCODONE-ACETAMINOPHEN 7.5-325 MG PO TABS: ORAL_TABLET | ORAL | 0 refills | Status: DC

## 2020-09-11 ENCOUNTER — Telehealth: Payer: Self-pay

## 2020-09-11 NOTE — Telephone Encounter (Signed)
Patient came by the office and dropped off a list of OTC supplements that he takes. He request that we add these medications to his medication list.   Medications added.

## 2020-09-27 ENCOUNTER — Other Ambulatory Visit: Payer: Self-pay

## 2020-09-27 DIAGNOSIS — I739 Peripheral vascular disease, unspecified: Secondary | ICD-10-CM

## 2020-09-27 MED ORDER — TERBINAFINE HCL 250 MG PO TABS: ORAL_TABLET | ORAL | 2 refills | Status: DC

## 2020-09-27 MED ORDER — CILOSTAZOL 50 MG PO TABS: 50.0000 mg | ORAL_TABLET | Freq: Two times a day (BID) | ORAL | 4 refills | Status: DC

## 2020-09-27 MED ORDER — SILDENAFIL CITRATE 100 MG PO TABS: ORAL_TABLET | ORAL | 8 refills | Status: DC

## 2020-09-27 MED ORDER — DIAZEPAM 10 MG PO TABS: ORAL_TABLET | ORAL | 1 refills | Status: DC

## 2020-09-27 MED ORDER — PRAVASTATIN SODIUM 40 MG PO TABS: 40.0000 mg | ORAL_TABLET | Freq: Every day | ORAL | 4 refills | Status: DC

## 2020-09-27 MED ORDER — LANSOPRAZOLE 30 MG PO CPDR: DELAYED_RELEASE_CAPSULE | ORAL | 4 refills | Status: DC

## 2020-09-27 MED ORDER — MELOXICAM 15 MG PO TABS: 15.0000 mg | ORAL_TABLET | Freq: Every day | ORAL | 1 refills | Status: DC | PRN

## 2020-09-27 NOTE — Telephone Encounter (Signed)
[facility] faxed refill request for the following medications:  cilostazol (PLETAL) 50 MG tablet  meloxicam (MOBIC) 15 MG tablet  pravastatin (PRAVACHOL) 40 MG tablet  diazepam (VALIUM) 10 MG tablet  terbinafine (LAMISIL) 250 MG tablet  lansoprazole (PREVACID) 30 MG capsule  sildenafil (VIAGRA) 100 MG tablet  Please advise.

## 2020-10-03 ENCOUNTER — Other Ambulatory Visit: Payer: Self-pay | Admitting: Family Medicine

## 2020-10-03 DIAGNOSIS — G629 Polyneuropathy, unspecified: Secondary | ICD-10-CM

## 2020-10-03 MED ORDER — TRAMADOL HCL 50 MG PO TABS: 50.0000 mg | ORAL_TABLET | Freq: Three times a day (TID) | ORAL | 4 refills | Status: DC | PRN

## 2020-10-03 NOTE — Telephone Encounter (Signed)
[facility] faxed refill request for the following medications:   traMADol (ULTRAM) 50 MG tablet   Please advise.

## 2020-10-04 ENCOUNTER — Telehealth: Payer: Self-pay | Admitting: Family Medicine

## 2020-10-04 DIAGNOSIS — I10 Essential (primary) hypertension: Secondary | ICD-10-CM

## 2020-10-04 MED ORDER — AMLODIPINE BESYLATE 2.5 MG PO TABS: 2.5000 mg | ORAL_TABLET | Freq: Every day | ORAL | 0 refills | Status: DC

## 2020-10-04 NOTE — Telephone Encounter (Signed)
This prescription was sent into [facility] on 08/31/2020 for a qty of 90 with 1 refill. I called and spoke with patient. He states that he has switched over to [facility] order pharmacy.

## 2020-10-04 NOTE — Telephone Encounter (Signed)
[facility] faxed refill request for the following medications:  amLODipine (NORVASC) 2.5 MG tablet   Please advise.

## 2020-10-06 ENCOUNTER — Other Ambulatory Visit: Payer: Self-pay | Admitting: Family Medicine

## 2020-10-06 DIAGNOSIS — I739 Peripheral vascular disease, unspecified: Secondary | ICD-10-CM

## 2020-10-06 NOTE — Telephone Encounter (Signed)
Change of pharmacy Requested Prescriptions  Pending Prescriptions Disp Refills  . cilostazol (PLETAL) 50 MG tablet [Pharmacy Med Name: CILOSTAZOL 50 MG TABLET] 180 tablet 4    Sig: TAKE ONE TABLET BY MOUTH TWICE A DAY     Hematology: Antiplatelets - cilostazol Failed - 10/06/2020 10:09 AM      Failed - HCT in normal range and within 180 days    Hematocrit  Date Value Ref Range Status  08/31/2020 36.1 (L) 37.5 - 51.0 % Final         Failed - HGB in normal range and within 180 days    Hemoglobin  Date Value Ref Range Status  08/31/2020 11.9 (L) 13.0 - 17.7 g/dL Final         Failed - WBC in normal range and within 180 days    WBC  Date Value Ref Range Status  08/31/2020 3.2 (L) 3.4 - 10.8 x10E3/uL Final  11/20/2016 4.5 3.8 - 10.8 Thousand/uL Final         Passed - PLT in normal range and within 180 days    Platelets  Date Value Ref Range Status  08/31/2020 153 150 - 450 x10E3/uL Final         Passed - Valid encounter within last 6 months    Recent Outpatient Visits          1 month ago Pre-diabetes   [facility] Birdie Sons, MD   6 months ago Pre-diabetes   [facility] Birdie Sons, MD   9 months ago Exudative tonsillitis   [place], Vickki Muff, PA-C   1 year ago Annual physical exam   [facility] Birdie Sons, MD   1 year ago Pre-diabetes   [facility] Birdie Sons, MD      Future Appointments            In 2 months Fisher, Kirstie Peri, MD [facility], [place]

## 2020-10-07 ENCOUNTER — Other Ambulatory Visit: Payer: Self-pay | Admitting: Family Medicine

## 2020-10-07 DIAGNOSIS — I10 Essential (primary) hypertension: Secondary | ICD-10-CM

## 2020-10-07 NOTE — Telephone Encounter (Signed)
Requested Prescriptions  Pending Prescriptions Disp Refills  . ramipril (ALTACE) 10 MG capsule [Pharmacy Med Name: RAMIPRIL 10 MG CAPSULE] 90 capsule 0    Sig: TAKE ONE CAPSULE BY MOUTH DAILY     Cardiovascular:  ACE Inhibitors Failed - 10/07/2020 10:58 AM      Failed - Last BP in normal range    BP Readings from Last 1 Encounters:  08/31/20 (!) 151/83         Passed - Cr in normal range and within 180 days    Creat  Date Value Ref Range Status  11/20/2016 0.90 0.70 - 1.33 mg/dL Final    Comment:    For patients >34 years of age, the reference limit for Creatinine is approximately 13% higher for people identified as African-American. .    Creatinine, Ser  Date Value Ref Range Status  08/31/2020 0.85 0.76 - 1.27 mg/dL Final         Passed - K in normal range and within 180 days    Potassium  Date Value Ref Range Status  08/31/2020 4.6 3.5 - 5.2 mmol/L Final  06/04/2012 3.7 3.5 - 5.1 mmol/L Final         Passed - Patient is not pregnant      Passed - Valid encounter within last 6 months    Recent Outpatient Visits          1 month ago Pre-diabetes   [facility] Birdie Sons, MD   6 months ago Pre-diabetes   [facility] Birdie Sons, MD   9 months ago Exudative tonsillitis   [place], Vickki Muff, PA-C   1 year ago Annual physical exam   [facility] Birdie Sons, MD   1 year ago [place], Donald E, MD      Future Appointments            In 1 month Fisher, Kirstie Peri, MD [facility], [address]

## 2020-10-10 ENCOUNTER — Other Ambulatory Visit: Payer: Self-pay | Admitting: Family Medicine

## 2020-10-10 DIAGNOSIS — G629 Polyneuropathy, unspecified: Secondary | ICD-10-CM

## 2020-10-10 NOTE — Telephone Encounter (Signed)
Copied from [place] [id number]. Topic: Quick Communication - Rx Refill/Question >> Oct 10, 2020  4:45 PM Tessa Lerner A wrote: Medication: traMADol (ULTRAM) 50 MG tablet   Has the patient contacted their pharmacy? Yes.   (Agent: If no, request that the patient contact the pharmacy for the refill.) (Agent: If yes, when and what did the pharmacy advise?)  Preferred Pharmacy (with phone number or street name): [facility] - [place], [place]  Phone:  [phone] Fax:  [phone]   Agent: Please be advised that RX refills may take up to 3 business days. We ask that you follow-up with your pharmacy.

## 2020-10-11 ENCOUNTER — Other Ambulatory Visit: Payer: Self-pay

## 2020-10-11 DIAGNOSIS — G629 Polyneuropathy, unspecified: Secondary | ICD-10-CM

## 2020-10-11 NOTE — Telephone Encounter (Signed)
[facility] faxed refill request for the following medications:  traMADol (ULTRAM) 50 MG tablet    Please advise.

## 2020-10-12 ENCOUNTER — Other Ambulatory Visit: Payer: Self-pay

## 2020-10-12 DIAGNOSIS — G629 Polyneuropathy, unspecified: Secondary | ICD-10-CM

## 2020-10-12 MED ORDER — TRAMADOL HCL 50 MG PO TABS: 50.0000 mg | ORAL_TABLET | Freq: Three times a day (TID) | ORAL | 4 refills | Status: DC | PRN

## 2020-10-12 NOTE — Telephone Encounter (Signed)
Requesting to be sent into mail order pharmacy instead of local. Ok to send?

## 2020-10-12 NOTE — Telephone Encounter (Signed)
Patient is needing refill on Hydrocodone 7.5 mg. - 325 mg. Sent to [facility].

## 2020-10-14 NOTE — Telephone Encounter (Signed)
Next dispense due 10-18-2020

## 2020-10-16 MED ORDER — HYDROCODONE-ACETAMINOPHEN 7.5-325 MG PO TABS: ORAL_TABLET | ORAL | 0 refills | Status: DC

## 2020-10-23 ENCOUNTER — Ambulatory Visit: Payer: Self-pay | Admitting: Surgery

## 2020-10-23 DIAGNOSIS — K432 Incisional hernia without obstruction or gangrene: Secondary | ICD-10-CM | POA: Diagnosis not present

## 2020-10-29 ENCOUNTER — Telehealth: Payer: Self-pay | Admitting: Family Medicine

## 2020-10-29 ENCOUNTER — Other Ambulatory Visit: Payer: Self-pay

## 2020-10-29 DIAGNOSIS — G629 Polyneuropathy, unspecified: Secondary | ICD-10-CM

## 2020-10-29 NOTE — Telephone Encounter (Signed)
[facility] faxed refill request for the following medications:   traMADol (ULTRAM) 50 MG tablet    Please advise.

## 2020-10-29 NOTE — Telephone Encounter (Signed)
LOV 08/31/20 NOV 12/05/20 LRF 7/29/

## 2020-10-30 ENCOUNTER — Other Ambulatory Visit: Payer: Self-pay | Admitting: Family Medicine

## 2020-10-30 DIAGNOSIS — G629 Polyneuropathy, unspecified: Secondary | ICD-10-CM

## 2020-10-30 MED ORDER — TRAMADOL HCL 50 MG PO TABS: 50.0000 mg | ORAL_TABLET | Freq: Three times a day (TID) | ORAL | 4 refills | Status: DC | PRN

## 2020-11-07 NOTE — Progress Notes (Signed)
DUE TO COVID-19 ONLY ONE VISITOR IS ALLOWED TO COME WITH YOU AND STAY IN THE WAITING ROOM ONLY DURING PRE OP AND PROCEDURE DAY OF SURGERY.  2 VISITOR  MAY VISIT WITH YOU AFTER SURGERY IN YOUR PRIVATE ROOM DURING VISITING HOURS ONLY!  YOU NEED TO HAVE A COVID 19 TEST ON______AME'@_'$  '@_from'$  8am-3pm _____, THIS TEST MUST BE DONE BEFORE SURGERY,  Covid test is done at [place], [place] [address].  This is a drive thru.  No appt required. Please see map.                 Your procedure is scheduled on:  11/22/2020   Report to [facility] Main  Entrance   Report to admitting at   [place]   AM     Call this number if you have problems the morning of surgery [phone]    REMEMBER: NO  SOLID FOOD CANDY OR GUM AFTER MIDNIGHT. CLEAR LIQUIDS UNTIL     0730am        . NOTHING BY MOUTH EXCEPT CLEAR LIQUIDS UNTIL 0730am    . PLEASE FINISH ENSURE DRINK PER SURGEON ORDER  WHICH NEEDS TO BE COMPLETED AT   0730am    .      CLEAR LIQUID DIET   Foods Allowed                                                                    Coffee and tea, regular and decaf                            Fruit ices (not with fruit pulp)                                      Iced Popsicles                                    Carbonated beverages, regular and diet                                    Cranberry, grape and apple juices Sports drinks like Gatorade Lightly seasoned clear broth or consume(fat free) Sugar, honey syrup ___________________________________________________________________      BRUSH YOUR TEETH MORNING OF SURGERY AND RINSE YOUR MOUTH OUT, NO CHEWING GUM CANDY OR MINTS.     Take these medicines the morning of surgery with A SIP OF WATER: amlodipine, lyrica   DO NOT TAKE ANY DIABETIC MEDICATIONS DAY OF YOUR SURGERY                               You may not have any metal on your body including hair pins and              piercings  Do not wear jewelry, make-up, lotions, powders  or perfumes, deodorant  Do not wear nail polish on your fingernails.  Do not shave  48 hours prior to surgery.              Men may shave face and neck.   Do not bring valuables to the hospital. [place].  Contacts, dentures or bridgework may not be worn into surgery.  Leave suitcase in the car. After surgery it may be brought to your room.     Patients discharged the day of surgery will not be allowed to drive home. IF YOU ARE HAVING SURGERY AND GOING HOME THE SAME DAY, YOU MUST HAVE AN ADULT TO DRIVE YOU HOME AND BE WITH YOU FOR 24 HOURS. YOU MAY GO HOME BY TAXI OR UBER OR ORTHERWISE, BUT AN ADULT MUST ACCOMPANY YOU HOME AND STAY WITH YOU FOR 24 HOURS.  Name and phone number of your driver:  Special Instructions: N/A              Please read over the following fact sheets you were given: _____________________________________________________________________  [facility] - Preparing for Surgery Before surgery, you can play an important role.  Because skin is not sterile, your skin needs to be as free of germs as possible.  You can reduce the number of germs on your skin by washing with CHG (chlorahexidine gluconate) soap before surgery.  CHG is an antiseptic cleaner which kills germs and bonds with the skin to continue killing germs even after washing. Please DO NOT use if you have an allergy to CHG or antibacterial soaps.  If your skin becomes reddened/irritated stop using the CHG and inform your nurse when you arrive at Short Stay. Do not shave (including legs and underarms) for at least 48 hours prior to the first CHG shower.  You may shave your face/neck. Please follow these instructions carefully:  1.  Shower with CHG Soap the night before surgery and the  morning of Surgery.  2.  If you choose to wash your hair, wash your hair first as usual with your  normal  shampoo.  3.  After you shampoo, rinse your hair and body  thoroughly to remove the  shampoo.                           4.  Use CHG as you would any other liquid soap.  You can apply chg directly  to the skin and wash                       Gently with a scrungie or clean washcloth.  5.  Apply the CHG Soap to your body ONLY FROM THE NECK DOWN.   Do not use on face/ open                           Wound or open sores. Avoid contact with eyes, ears mouth and genitals (private parts).                       Wash face,  Genitals (private parts) with your normal soap.             6.  Wash thoroughly, paying special attention to the area where your surgery  will be performed.  7.  Thoroughly rinse your body with  warm water from the neck down.  8.  DO NOT shower/wash with your normal soap after using and rinsing off  the CHG Soap.                9.  Pat yourself dry with a clean towel.            10.  Wear clean pajamas.            11.  Place clean sheets on your bed the night of your first shower and do not  sleep with pets. Day of Surgery : Do not apply any lotions/deodorants the morning of surgery.  Please wear clean clothes to the hospital/surgery center.  FAILURE TO FOLLOW THESE INSTRUCTIONS MAY RESULT IN THE CANCELLATION OF YOUR SURGERY PATIENT SIGNATURE_________________________________  NURSE SIGNATURE__________________________________  ________________________________________________________________________

## 2020-11-12 ENCOUNTER — Other Ambulatory Visit: Payer: Self-pay

## 2020-11-12 ENCOUNTER — Encounter (HOSPITAL_COMMUNITY): Payer: Self-pay

## 2020-11-12 ENCOUNTER — Encounter (HOSPITAL_COMMUNITY)
Admission: RE | Admit: 2020-11-12 | Discharge: 2020-11-12 | Disposition: A | Discharge status: Home / Self Care | Payer: Medicare HMO | Source: Ambulatory Visit | Attending: Surgery | Admitting: Surgery

## 2020-11-12 DIAGNOSIS — E119 Type 2 diabetes mellitus without complications: Secondary | ICD-10-CM | POA: Diagnosis not present

## 2020-11-12 DIAGNOSIS — Z01818 Encounter for other preprocedural examination: Secondary | ICD-10-CM | POA: Diagnosis not present

## 2020-11-12 HISTORY — DX: Type 2 diabetes mellitus without complications: E11.9

## 2020-11-12 HISTORY — DX: Charcot's joint, unspecified site: M14.60

## 2020-11-12 HISTORY — DX: Other specified postprocedural states: Z98.890

## 2020-11-12 HISTORY — DX: Unspecified osteoarthritis, unspecified site: M19.90

## 2020-11-12 HISTORY — DX: Gastro-esophageal reflux disease without esophagitis: K21.9

## 2020-11-12 HISTORY — DX: Other specified postprocedural states: R11.2

## 2020-11-12 HISTORY — DX: Polyneuropathy, unspecified: G62.9

## 2020-11-12 LAB — BASIC METABOLIC PANEL
Anion gap: 6 (ref 5–15)
BUN: 18 mg/dL (ref 8–23)
CO2: 28 mmol/L (ref 22–32)
Calcium: 8.9 mg/dL (ref 8.9–10.3)
Chloride: 102 mmol/L (ref 98–111)
Creatinine, Ser: 0.85 mg/dL (ref 0.61–1.24)
GFR, Estimated: >60 mL/min (ref >60)
Glucose, Bld: 153 mg/dL — ABNORMAL HIGH (ref 70–99)
Potassium: 4.3 mmol/L (ref 3.5–5.1)
Sodium: 136 mmol/L (ref 135–145)

## 2020-11-12 LAB — HEMOGLOBIN A1C
Hgb A1c MFr Bld: 6.2 % — ABNORMAL HIGH (ref 4.8–5.6)
Mean Plasma Glucose: 131.24 mg/dL

## 2020-11-12 LAB — CBC
HCT: 38.7 % — ABNORMAL LOW (ref 39.0–52.0)
Hemoglobin: 12.9 g/dL — ABNORMAL LOW (ref 13.0–17.0)
MCH: 32.1 pg (ref 26.0–34.0)
MCHC: 33.3 g/dL (ref 30.0–36.0)
MCV: 96.3 fL (ref 80.0–100.0)
Platelets: 163 10*3/uL (ref 150–400)
RBC: 4.02 MIL/uL — ABNORMAL LOW (ref 4.22–5.81)
RDW: 13.8 % (ref 11.5–15.5)
WBC: 3.7 10*3/uL — ABNORMAL LOW (ref 4.0–10.5)
nRBC: 0 % (ref 0.0–0.2)

## 2020-11-12 LAB — GLUCOSE, CAPILLARY: Glucose-Capillary: 177 mg/dL — ABNORMAL HIGH (ref 70–99)

## 2020-11-12 NOTE — Progress Notes (Addendum)
Anesthesia Review:  PCP: DR Elenore Rota fisher LOV 08/21/2020  Cardiologist : none  Chest x-ray : EKG : 11/12/20  Echo : Stress test: Cardiac Cath :  Activity level: can do a fight of starirs iwthout difficulty  Sleep Study/ CPAP :  none  Fasting Blood Sugar :      / Checks Blood Sugar -- times a day:   Blood Thinner/ Instructions /Last Dose: ASA / Instructions/ Last Dose :   DM- Prediabetes per note PT does not know if he is diabetic or prediabetic.   Rarely checks glucose at home per pt  No coivd test required- amb surgery  Hgba1c- 11/12/20 - 6.2  ASPIRIN- 81 MG LAST DOSE 2 MONTHS AGO PER PT  Blood pressure at preop was 167/95 in right arm and in left arm was 165/90.  Pt denies any chest pain, shortness of breath or dizziness.  Pt reports recent change in blood pressure medication from Ramipril to Amlodipine by PCP.

## 2020-11-19 ENCOUNTER — Encounter (HOSPITAL_COMMUNITY): Payer: Self-pay | Admitting: Surgery

## 2020-11-19 DIAGNOSIS — K432 Incisional hernia without obstruction or gangrene: Secondary | ICD-10-CM | POA: Diagnosis present

## 2020-11-19 NOTE — H&P (Signed)
REFERRING PHYSICIAN: Birdie Sons, MD  PROVIDER: Shin Lamour Charlotta Newton, MD  DOB: [birth year]   Chief Complaint: Hernia (Ventral incisional hernia)  History of Present Illness:  Patient is referred by Dr. Lelon Huh at [facility] family practice for surgical evaluation and management of a ventral incisional hernia. Patient had undergone urgent colon surgery in [place] in 2015. Procedure was done through a midline abdominal incision. Patient did not require colostomy placement. He did have a follow-up colonoscopy 1 year later. Patient has not had any other abdominal surgery. Patient has developed a bulge to the right of the midline incision in the upper abdomen. This is always been reducible. Patient does take chronic laxatives for constipation. Patient has had no prior hernia repairs. He presents today to discuss repair of incisional hernia.  Review of Systems: A complete review of systems was obtained from the patient. I have reviewed this information and discussed as appropriate with the patient. See HPI as well for other ROS.  Review of Systems  Constitutional: Negative.  HENT: Negative.  Eyes: Negative.  Respiratory: Negative.  Cardiovascular: Positive for leg swelling.  Gastrointestinal: Positive for abdominal pain.  Genitourinary: Negative.  Musculoskeletal: Negative.  Skin: Negative.  Neurological: Negative.  Endo/Heme/Allergies: Negative.  Psychiatric/Behavioral: Negative.    Medical History: Past Medical History:  Diagnosis Date   Arthritis   Diabetes mellitus without complication (CMS-HCC)   GERD (gastroesophageal reflux disease)   Patient Active Problem List  Diagnosis   Incisional hernia, without obstruction or gangrene   Abnormal liver enzymes   Acid reflux   Allergic to bees   Anxiety   Arthralgia of both hands   Bilateral lower extremity edema   Charcot's joint of left foot   Chronic, continuous use of opioids   Clinical  depression   Diverticulosis of colon   Episodic paroxysmal anxiety disorder   Essential hypertension   H/O alcohol abuse   Hyperlipidemia   Idiopathic neuropathy   Iron deficiency anemia   Lymphedema   Microalbuminuria   Prediabetes   PVD (peripheral vascular disease) (CMS-HCC)   Recurrent major depressive disorder, in full remission (CMS-HCC)   Rosacea   Varicose veins of both lower extremities   Venous insufficiency of both lower extremities   Ventral hernia without obstruction or gangrene   Past Surgical History:  Procedure Laterality Date   COLON SURGERY   SPINE SURGERY    No Known Allergies  Current Outpatient Medications on File Prior to Visit  Medication Sig Dispense Refill   amLODIPine (NORVASC) 2.5 MG tablet Take by mouth   azelaic acid 15 % Foam Apply 1 Application topically 2 (two) times daily   cilostazoL (PLETAL) 50 MG tablet Take 1 tablet by mouth 2 (two) times daily   diazePAM (VALIUM) 10 MG tablet Take 1 tablet by mouth once daily as needed   diclofenac (VOLTAREN) 1 % topical gel Apply 2 g topically 4 (four) times daily   HYDROcodone-acetaminophen (NORCO) 7.5-325 mg tablet 1/2-1 tablet up to four times a day as needed   lansoprazole (PREVACID) 30 MG DR capsule TAKE ONE CAPSULE BY MOUTH DAILY AT 12 NOON   meclizine (ANTIVERT) 25 mg tablet Take 25 mg by mouth 3 (three) times daily as needed   meloxicam (MOBIC) 15 MG tablet Take by mouth   pravastatin (PRAVACHOL) 40 MG tablet Take 40 mg by mouth once daily   pregabalin (LYRICA) 225 MG capsule Take by mouth   ramipriL (ALTACE) 10 MG capsule Take 1  capsule by mouth once daily   traMADoL (ULTRAM) 50 mg tablet Take by mouth   ascorbic acid, vitamin C, (VITAMIN C) 1000 MG tablet Take by mouth   aspirin 81 MG EC tablet Take by mouth   cholecalciferol (VITAMIN D3) 2,000 unit tablet Take 1 tablet by mouth once daily   cyanocobalamin (VITAMIN B12) 500 MCG tablet Take by mouth   docusate (COLACE) 100 MG capsule Take  100 mg by mouth 2 (two) times daily   iron,carb/vit C/vit B12/folic (IRON 123XX123 PLUS ORAL) Take by mouth   magnesium oxide (MAG-OX) 400 mg (241.3 mg magnesium) tablet Take 1 tablet by mouth once daily   selenium 200 mcg tablet Take by mouth   sildenafiL (VIAGRA) 100 MG tablet   terbinafine HCL (LAMISIL) 250 mg tablet   vitamin E 400 UNIT capsule Take by mouth   zinc sulfate 50 mg zinc (220 mg) Tab Take 1 tablet by mouth once daily   No current facility-administered medications on file prior to visit.   Family History  Problem Relation Age of Onset   Coronary Artery Disease (Blocked arteries around heart) Sister    Social History   Tobacco Use  Smoking Status Former Smoker  Smokeless Tobacco Never Used    Social History   Socioeconomic History   Marital status: Single  Tobacco Use   Smoking status: Former Smoker   Smokeless tobacco: Never Used  Scientific laboratory technician Use: Never used  Substance and Sexual Activity   Alcohol use: Yes   Drug use: Never   Objective:   Vitals:  BP: (!) 156/88  Pulse: 73  Temp: 36.9 C (98.4 F)  Weight: (!) 107.1 kg (236 lb 3.2 oz)  Height: 189.2 cm (6' 2.5")   Body mass index is 29.92 kg/m.  Physical Exam   GENERAL APPEARANCE Development: normal Nutritional status: normal Gross deformities: none  SKIN Rash, lesions, ulcers: none Induration, erythema: none Nodules: none palpable  EYES Conjunctiva and lids: normal Pupils: equal and reactive Iris: normal bilaterally  EARS, NOSE, MOUTH, THROAT External ears: no lesion or deformity External nose: no lesion or deformity Hearing: grossly normal Due to Covid-19 pandemic, patient is wearing a mask.  NECK Symmetric: yes Trachea: midline Thyroid: no palpable nodules in the thyroid bed  CHEST Respiratory effort: normal Retraction or accessory muscle use: no Breath sounds: normal bilaterally Rales, rhonchi, wheeze: none  CARDIOVASCULAR Auscultation: regular rhythm, normal  rate Murmurs: none Pulses: radial pulse 2+ palpable Lower extremity edema: mild bilateral, brace in left ankle  ABDOMEN Distension: none Masses: none palpable Tenderness: none Hepatosplenomegaly: not present Hernia: In the epigastrium there is a midline surgical scar. There is an obvious bulge just to the right of midline. This reduces. Fascial defect measures approximately 2 cm in diameter. It is minimally tender. It prolapses with Valsalva.  MUSCULOSKELETAL Station and gait: normal Digits and nails: no clubbing or cyanosis Muscle strength: grossly normal all extremities Range of motion: grossly normal all extremities Deformity: none  LYMPHATIC Cervical: none palpable Supraclavicular: none palpable  PSYCHIATRIC Oriented to person, place, and time: yes Mood and affect: normal for situation Judgment and insight: appropriate for situation  Assessment and Plan:   Incisional hernia, without obstruction or gangrene  Patient is referred by his primary care physician, Dr. Lelon Huh, for surgical evaluation and management of incisional hernia. Today we discussed the options for management. The hernia defect is relatively small. The remainder of the surgical wound appears to be intact. We discussed open  repair versus laparoscopic repair. We discussed the use of a mesh patch which could be implanted beneath the fascia. We discussed doing this as an outpatient surgical procedure. We discussed the risk of recurrence being approximately 10%. In the event of recurrence, I would recommend then moving to a laparoscopic repair.  We discussed the restrictions on his activities following surgery. The patient understands and wishes to proceed with this procedure in the near future.  The risks and benefits of the procedure have been discussed at length with the patient. The patient understands the proposed procedure, potential alternative treatments, and the course of recovery to be expected. All  of the patient's questions have been answered at this time. The patient wishes to proceed with surgery.   [name], MD [facility] [place] practice Office: [phone]

## 2020-11-21 ENCOUNTER — Other Ambulatory Visit: Payer: Self-pay

## 2020-11-21 ENCOUNTER — Other Ambulatory Visit: Payer: Self-pay | Admitting: Family Medicine

## 2020-11-21 DIAGNOSIS — G629 Polyneuropathy, unspecified: Secondary | ICD-10-CM

## 2020-11-21 MED ORDER — PREGABALIN 225 MG PO CAPS: 225.0000 mg | ORAL_CAPSULE | Freq: Three times a day (TID) | ORAL | 4 refills | Status: DC

## 2020-11-21 NOTE — Telephone Encounter (Signed)
Last refill: 05/08/2020 #270, with 1 refill  Last office visit: seen 08/31/2020 for f/u chronic problems Next office visit: 12/05/2020

## 2020-11-21 NOTE — Telephone Encounter (Signed)
[facility] faxed refill request for the following medications:    pregabalin (LYRICA) 225 MG capsule   Please advise.

## 2020-11-21 NOTE — Addendum Note (Signed)
Addended by: Ashley Royalty E on: 11/21/2020 04:11 PM   Modules accepted: Orders

## 2020-11-21 NOTE — Telephone Encounter (Signed)
Patient is asking for refill on Hydrocodone-Acet. 7.5 - 325 mg. Sent to [facility]

## 2020-11-21 NOTE — Telephone Encounter (Signed)
LOV: 03/12/2020  NOV: 12/05/2020  Last Refill: 10/16/2020 #120 0 Refills  Thanks,   -Mickel Baas

## 2020-11-22 ENCOUNTER — Ambulatory Visit (HOSPITAL_COMMUNITY): Payer: Medicare HMO | Admitting: Anesthesiology

## 2020-11-22 ENCOUNTER — Ambulatory Visit (HOSPITAL_COMMUNITY): Payer: Medicare HMO | Admitting: Physician Assistant

## 2020-11-22 ENCOUNTER — Encounter (HOSPITAL_COMMUNITY): Payer: Self-pay | Admitting: Surgery

## 2020-11-22 ENCOUNTER — Encounter (HOSPITAL_COMMUNITY)
Admission: RE | Disposition: A | Discharge status: Home / Self Care | Payer: Self-pay | Source: Home / Self Care | Attending: Surgery

## 2020-11-22 ENCOUNTER — Ambulatory Visit (HOSPITAL_COMMUNITY)
Admission: RE | Admit: 2020-11-22 | Discharge: 2020-11-22 | Disposition: A | Discharge status: Home / Self Care | Payer: Medicare HMO | Attending: Surgery | Admitting: Surgery

## 2020-11-22 DIAGNOSIS — Z7982 Long term (current) use of aspirin: Secondary | ICD-10-CM | POA: Insufficient documentation

## 2020-11-22 DIAGNOSIS — I872 Venous insufficiency (chronic) (peripheral): Secondary | ICD-10-CM | POA: Diagnosis not present

## 2020-11-22 DIAGNOSIS — K5900 Constipation, unspecified: Secondary | ICD-10-CM | POA: Insufficient documentation

## 2020-11-22 DIAGNOSIS — K430 Incisional hernia with obstruction, without gangrene: Secondary | ICD-10-CM | POA: Diagnosis not present

## 2020-11-22 DIAGNOSIS — Z87891 Personal history of nicotine dependence: Secondary | ICD-10-CM | POA: Insufficient documentation

## 2020-11-22 DIAGNOSIS — Z79899 Other long term (current) drug therapy: Secondary | ICD-10-CM | POA: Insufficient documentation

## 2020-11-22 DIAGNOSIS — K432 Incisional hernia without obstruction or gangrene: Secondary | ICD-10-CM | POA: Diagnosis not present

## 2020-11-22 DIAGNOSIS — Z79891 Long term (current) use of opiate analgesic: Secondary | ICD-10-CM | POA: Insufficient documentation

## 2020-11-22 DIAGNOSIS — D509 Iron deficiency anemia, unspecified: Secondary | ICD-10-CM | POA: Diagnosis not present

## 2020-11-22 DIAGNOSIS — Z9889 Other specified postprocedural states: Secondary | ICD-10-CM | POA: Diagnosis not present

## 2020-11-22 DIAGNOSIS — E785 Hyperlipidemia, unspecified: Secondary | ICD-10-CM | POA: Diagnosis not present

## 2020-11-22 HISTORY — PX: VENTRAL HERNIA REPAIR: SHX424

## 2020-11-22 LAB — GLUCOSE, CAPILLARY
Glucose-Capillary: 98 mg/dL (ref 70–99)
Glucose-Capillary: 135 mg/dL — ABNORMAL HIGH (ref 70–99)

## 2020-11-22 SURGERY — REPAIR, HERNIA, VENTRAL
Anesthesia: General | Site: Abdomen

## 2020-11-22 MED ORDER — SUGAMMADEX SODIUM 500 MG/5ML IV SOLN
INTRAVENOUS | Status: AC
  Filled 2020-11-22: qty 5

## 2020-11-22 MED ORDER — EPHEDRINE SULFATE-NACL 50-0.9 MG/10ML-% IV SOSY
PREFILLED_SYRINGE | INTRAVENOUS | Status: DC | PRN
  Administered 2020-11-22 (×2): 10 mg via INTRAVENOUS

## 2020-11-22 MED ORDER — EPHEDRINE 5 MG/ML INJ
INTRAVENOUS | Status: AC
  Filled 2020-11-22: qty 5

## 2020-11-22 MED ORDER — FENTANYL CITRATE (PF) 250 MCG/5ML IJ SOLN
INTRAMUSCULAR | Status: AC
  Filled 2020-11-22: qty 5

## 2020-11-22 MED ORDER — CEFAZOLIN SODIUM-DEXTROSE 2-4 GM/100ML-% IV SOLN
2.0000 g | INTRAVENOUS | Status: AC
  Administered 2020-11-22: 2 g via INTRAVENOUS
  Filled 2020-11-22: qty 100

## 2020-11-22 MED ORDER — ROCURONIUM BROMIDE 10 MG/ML (PF) SYRINGE
PREFILLED_SYRINGE | INTRAVENOUS | Status: DC | PRN
  Administered 2020-11-22: 100 mg via INTRAVENOUS

## 2020-11-22 MED ORDER — CHLORHEXIDINE GLUCONATE 0.12 % MT SOLN
15.0000 mL | Freq: Once | OROMUCOSAL | Status: AC
  Administered 2020-11-22: 15 mL via OROMUCOSAL

## 2020-11-22 MED ORDER — PROMETHAZINE HCL 25 MG/ML IJ SOLN: 6.2500 mg | INTRAMUSCULAR | Status: DC | PRN

## 2020-11-22 MED ORDER — MEPERIDINE HCL 50 MG/ML IJ SOLN: 6.2500 mg | INTRAMUSCULAR | Status: DC | PRN

## 2020-11-22 MED ORDER — HYDROMORPHONE HCL 1 MG/ML IJ SOLN
INTRAMUSCULAR | Status: AC
  Filled 2020-11-22: qty 1

## 2020-11-22 MED ORDER — BUPIVACAINE LIPOSOME 1.3 % IJ SUSP: 20.0000 mL | Freq: Once | INTRAMUSCULAR | Status: DC

## 2020-11-22 MED ORDER — HYDROCODONE-ACETAMINOPHEN 7.5-325 MG PO TABS
ORAL_TABLET | ORAL | 0 refills | Status: DC
Start: 2020-11-22 — End: 2020-12-18

## 2020-11-22 MED ORDER — SUGAMMADEX SODIUM 500 MG/5ML IV SOLN
INTRAVENOUS | Status: DC | PRN
  Administered 2020-11-22: 430 mg via INTRAVENOUS

## 2020-11-22 MED ORDER — OXYCODONE HCL 5 MG/5ML PO SOLN: 5.0000 mg | Freq: Once | ORAL | Status: AC | PRN

## 2020-11-22 MED ORDER — OXYCODONE HCL 5 MG PO TABS
5.0000 mg | ORAL_TABLET | Freq: Once | ORAL | Status: AC | PRN
  Administered 2020-11-22: 5 mg via ORAL

## 2020-11-22 MED ORDER — OXYCODONE HCL 5 MG PO TABS
ORAL_TABLET | ORAL | Status: AC
  Filled 2020-11-22: qty 1

## 2020-11-22 MED ORDER — ONDANSETRON HCL 4 MG/2ML IJ SOLN
INTRAMUSCULAR | Status: DC | PRN
  Administered 2020-11-22: 4 mg via INTRAVENOUS

## 2020-11-22 MED ORDER — DEXAMETHASONE SODIUM PHOSPHATE 10 MG/ML IJ SOLN
INTRAMUSCULAR | Status: DC | PRN
  Administered 2020-11-22: 4 mg via INTRAVENOUS

## 2020-11-22 MED ORDER — PROPOFOL 10 MG/ML IV BOLUS
INTRAVENOUS | Status: AC
  Filled 2020-11-22: qty 20

## 2020-11-22 MED ORDER — AMISULPRIDE (ANTIEMETIC) 5 MG/2ML IV SOLN: 10.0000 mg | Freq: Once | INTRAVENOUS | Status: DC | PRN

## 2020-11-22 MED ORDER — CHLORHEXIDINE GLUCONATE CLOTH 2 % EX PADS: 6.0000 | MEDICATED_PAD | Freq: Once | CUTANEOUS | Status: DC

## 2020-11-22 MED ORDER — MIDAZOLAM HCL 2 MG/2ML IJ SOLN
INTRAMUSCULAR | Status: AC
  Filled 2020-11-22: qty 2

## 2020-11-22 MED ORDER — 0.9 % SODIUM CHLORIDE (POUR BTL) OPTIME
TOPICAL | Status: DC | PRN
  Administered 2020-11-22: 1000 mL

## 2020-11-22 MED ORDER — LACTATED RINGERS IV SOLN: INTRAVENOUS | Status: DC

## 2020-11-22 MED ORDER — HYDROMORPHONE HCL 1 MG/ML IJ SOLN
0.2500 mg | INTRAMUSCULAR | Status: DC | PRN
  Administered 2020-11-22 (×2): 0.5 mg via INTRAVENOUS

## 2020-11-22 MED ORDER — HYDROMORPHONE HCL 1 MG/ML IJ SOLN
INTRAMUSCULAR | Status: AC
  Administered 2020-11-22: 0.5 mg via INTRAVENOUS
  Filled 2020-11-22: qty 1

## 2020-11-22 MED ORDER — BUPIVACAINE LIPOSOME 1.3 % IJ SUSP
INTRAMUSCULAR | Status: AC
  Filled 2020-11-22: qty 20

## 2020-11-22 MED ORDER — FENTANYL CITRATE (PF) 100 MCG/2ML IJ SOLN
INTRAMUSCULAR | Status: DC | PRN
  Administered 2020-11-22: 150 ug via INTRAVENOUS
  Administered 2020-11-22 (×2): 50 ug via INTRAVENOUS

## 2020-11-22 MED ORDER — PROPOFOL 10 MG/ML IV BOLUS
INTRAVENOUS | Status: DC | PRN
  Administered 2020-11-22: 200 mg via INTRAVENOUS

## 2020-11-22 MED ORDER — ORAL CARE MOUTH RINSE: 15.0000 mL | Freq: Once | OROMUCOSAL | Status: AC

## 2020-11-22 MED ORDER — MIDAZOLAM HCL 5 MG/5ML IJ SOLN
INTRAMUSCULAR | Status: DC | PRN
  Administered 2020-11-22: 2 mg via INTRAVENOUS

## 2020-11-22 MED ORDER — BUPIVACAINE LIPOSOME 1.3 % IJ SUSP
INTRAMUSCULAR | Status: DC | PRN
  Administered 2020-11-22: 20 mL

## 2020-11-22 MED ORDER — LIDOCAINE 2% (20 MG/ML) 5 ML SYRINGE
INTRAMUSCULAR | Status: DC | PRN
  Administered 2020-11-22: 80 mg via INTRAVENOUS

## 2020-11-22 SURGICAL SUPPLY — 26 items
BAG COUNTER SPONGE SURGICOUNT (BAG) IMPLANT
BINDER ABDOMINAL 12 ML 46-62 (SOFTGOODS) IMPLANT
CHLORAPREP W/TINT 26 (MISCELLANEOUS) ×4 IMPLANT
COVER SURGICAL LIGHT HANDLE (MISCELLANEOUS) ×2 IMPLANT
DERMABOND ADVANCED (GAUZE/BANDAGES/DRESSINGS) ×1
DERMABOND ADVANCED .7 DNX12 (GAUZE/BANDAGES/DRESSINGS) ×1 IMPLANT
DRAPE LAPAROSCOPIC ABDOMINAL (DRAPES) ×2 IMPLANT
ELECT REM PT RETURN 15FT ADLT (MISCELLANEOUS) ×2 IMPLANT
GAUZE SPONGE 4X4 12PLY STRL (GAUZE/BANDAGES/DRESSINGS) ×2 IMPLANT
GLOVE SURG ORTHO LTX SZ8 (GLOVE) ×2 IMPLANT
GLOVE SURG SYN 7.5  E (GLOVE) ×2
GLOVE SURG SYN 7.5 E (GLOVE) ×1 IMPLANT
GOWN STRL REUS W/TWL XL LVL3 (GOWN DISPOSABLE) ×4 IMPLANT
KIT BASIN OR (CUSTOM PROCEDURE TRAY) ×2 IMPLANT
KIT TURNOVER KIT A (KITS) ×2 IMPLANT
MARKER SKIN DUAL TIP RULER LAB (MISCELLANEOUS) ×2 IMPLANT
MESH VENTRALEX ST 1-7/10 CRC S (Mesh General) ×2 IMPLANT
PACK GENERAL/GYN (CUSTOM PROCEDURE TRAY) ×2 IMPLANT
STAPLER VISISTAT 35W (STAPLE) ×2 IMPLANT
SUT NOVA 1 T20/GS 25DT (SUTURE) ×2 IMPLANT
SUT NOVA NAB GS-21 0 18 T12 DT (SUTURE) IMPLANT
SUT PDS AB 1 CTX 36 (SUTURE) IMPLANT
SUT VIC AB 2-0 CT2 27 (SUTURE) IMPLANT
SUT VIC AB 3-0 SH 18 (SUTURE) ×2 IMPLANT
TOWEL OR 17X26 10 PK STRL BLUE (TOWEL DISPOSABLE) ×2 IMPLANT
TRAY FOLEY MTR SLVR 16FR STAT (SET/KITS/TRAYS/PACK) IMPLANT

## 2020-11-22 NOTE — Anesthesia Procedure Notes (Signed)
Procedure Name: Intubation Date/Time: 11/22/2020 10:38 AM Performed by: Lavina Hamman, CRNA Pre-anesthesia Checklist: Patient identified, Emergency Drugs available, Suction available, Patient being monitored and Timeout performed Patient Re-evaluated:Patient Re-evaluated prior to induction Oxygen Delivery Method: Circle system utilized Preoxygenation: Pre-oxygenation with 100% oxygen Induction Type: IV induction Ventilation: Mask ventilation without difficulty and Oral airway inserted - appropriate to patient size Laryngoscope Size: Mac and 4 Grade View: Grade I Tube type: Oral Tube size: 7.5 mm Number of attempts: 1 Airway Equipment and Method: Stylet Placement Confirmation: ETT inserted through vocal cords under direct vision, positive ETCO2, CO2 detector and breath sounds checked- equal and bilateral Secured at: 23 cm Tube secured with: Tape Dental Injury: Teeth and Oropharynx as per pre-operative assessment  Comments: ATOI

## 2020-11-22 NOTE — Op Note (Signed)
Operative Note  Pre-operative Diagnosis:  ventral incisional hernia  Post-operative Diagnosis:  same  Surgeon:  [name], MD  Assistant:  none   Procedure:  Open repair of ventral incisional hernia with mesh patch (4.3 cm Bard Ventralex)  Anesthesia:  general  Estimated Blood Loss:  minimal  Drains: none         Specimen: none  Indications:  Patient is referred by Dr. Lelon Huh at [facility] family practice for surgical evaluation and management of a ventral incisional hernia. Patient had undergone urgent colon surgery in [place] in 2015. Procedure was done through a midline abdominal incision. Patient did not require colostomy placement. He did have a follow-up colonoscopy 1 year later. Patient has not had any other abdominal surgery. Patient has developed a bulge to the right of the midline incision in the upper abdomen. This is always been reducible. Patient does take chronic laxatives for constipation. Patient has had no prior hernia repairs. He presents today to discuss repair of incisional hernia.  Procedure:  The patient was seen in the pre-op holding area. The risks, benefits, complications, treatment options, and expected outcomes were previously discussed with the patient. The patient agreed with the proposed plan and has signed the informed consent form.  The patient was brought to the operating room by the surgical team, identified as Dustin Guerra and the procedure verified. A "time out" was completed and the above information confirmed.  Following administration of general endotracheal anesthesia, the patient is positioned and then prepped and draped in the usual aseptic fashion.  After ascertaining that an adequate level of anesthesia been achieved, a transverse incision is made at the site of the hernia in the upper mid abdomen with a #10 blade.  Dissection is carried through subcutaneous tissues.  Herniated tissue was identified and dissected  out down to the underlying fascia.  Suture material from the previous midline closure is noted.  The fascial plane is developed circumferentially with the electrocautery.  Fascial defect appears to be approximately 1.5 cm in greatest diameter.  It contains incarcerated preperitoneal fat and omentum which is amputated and discarded.  The plane beneath the fascia is developed.  On palpation there are no other fascial defects near this single defect.  A Bard Ventralex 4.3 cm mesh patch is selected.  It is prepared and inserted beneath the fascia and deployed circumferentially.  Fascia is then closed incorporating the anterior layer of the mesh and the closure with interrupted #1 Novafil simple sutures.  Local field block is placed with Exparel.  Good hemostasis is noted.  Subcutaneous tissues are closed with interrupted 3-0 Vicryl sutures.  Skin is anesthetized with Exparel.  Skin edges are reapproximated with a running 4-0 Monocryl subcuticular suture.  Wound is washed and dried and Dermabond is applied as dressing.  Patient is awakened from anesthesia and transported to the recovery room.  The patient tolerated the procedure well.   [name], [place] Office: [phone]

## 2020-11-22 NOTE — Anesthesia Postprocedure Evaluation (Signed)
Anesthesia Post Note  Patient: Dustin Guerra  Procedure(s) Performed: OPEN VENTRAL INCISIONAL HERNIA REPAIR WITH MESH PATCH (Abdomen)     Patient location during evaluation: PACU Anesthesia Type: General Level of consciousness: awake and alert Pain management: pain level controlled Vital Signs Assessment: post-procedure vital signs reviewed and stable Respiratory status: spontaneous breathing, nonlabored ventilation and respiratory function stable Cardiovascular status: blood pressure returned to baseline and stable Postop Assessment: no apparent nausea or vomiting Anesthetic complications: no   No notable events documented.  Last Vitals:  Vitals:   11/22/20 1145 11/22/20 1200  BP: (!) 159/95 (!) 155/90  Pulse: 70 60  Resp: 12 13  Temp:    SpO2: 98% 94%    Last Pain:  Vitals:   11/22/20 1200  TempSrc:   PainSc: [place]

## 2020-11-22 NOTE — Transfer of Care (Signed)
Immediate Anesthesia Transfer of Care Note  Patient: Dustin Guerra  Procedure(s) Performed: Procedure(s): OPEN VENTRAL INCISIONAL HERNIA REPAIR WITH MESH PATCH (N/A)  Patient Location: PACU  Anesthesia Type:General  Level of Consciousness:  sedated, patient cooperative and responds to stimulation  Airway & Oxygen Therapy:Patient Spontanous Breathing and Patient connected to face mask oxgen  Post-op Assessment:  Report given to PACU RN and Post -op Vital signs reviewed and stable  Post vital signs:  Reviewed and stable  Last Vitals:  Vitals:   11/22/20 0911  BP: (!) 178/98  Pulse: 62  Resp: 18  Temp: 37.1 C  SpO2: 123XX123    Complications: No apparent anesthesia complications

## 2020-11-22 NOTE — Anesthesia Preprocedure Evaluation (Signed)
Anesthesia Evaluation  Patient identified by MRN, date of birth, ID band Patient awake    Reviewed: Allergy & Precautions, NPO status , Patient's Chart, lab work & pertinent test results  History of Anesthesia Complications (+) PONV  Airway Mallampati: II  TM Distance: >3 FB Neck ROM: Full    Dental no notable dental hx.    Pulmonary neg pulmonary ROS, former smoker,    Pulmonary exam normal breath sounds clear to auscultation       Cardiovascular hypertension, Pt. on medications + Peripheral Vascular Disease  Normal cardiovascular exam Rhythm:Regular Rate:Normal     Neuro/Psych Anxiety Depression negative neurological ROS  negative psych ROS   GI/Hepatic Neg liver ROS, GERD  ,  Endo/Other  negative endocrine ROSdiabetes  Renal/GU negative Renal ROS  negative genitourinary   Musculoskeletal  (+) Arthritis , Osteoarthritis,    Abdominal (+) + obese,   Peds negative pediatric ROS (+)  Hematology negative hematology ROS (+)   Anesthesia Other Findings   Reproductive/Obstetrics negative OB ROS                             Anesthesia Physical Anesthesia Plan  ASA: 3  Anesthesia Plan: General   Post-op Pain Management:    Induction: Intravenous  PONV Risk Score and Plan: 3 and Ondansetron, Dexamethasone, Midazolam and Treatment may vary due to age or medical condition  Airway Management Planned: Oral ETT  Additional Equipment:   Intra-op Plan:   Post-operative Plan: Extubation in OR  Informed Consent: I have reviewed the patients History and Physical, chart, labs and discussed the procedure including the risks, benefits and alternatives for the proposed anesthesia with the patient or authorized representative who has indicated his/her understanding and acceptance.     Dental advisory given  Plan Discussed with: CRNA  Anesthesia Plan Comments:         Anesthesia  Quick Evaluation

## 2020-11-22 NOTE — Interval H&P Note (Signed)
History and Physical Interval Note:  11/22/2020 10:02 AM  Dustin Guerra  has presented today for surgery, with the diagnosis of VENTRAL INCISIONAL HERNIA.  The various methods of treatment have been discussed with the patient and family. After consideration of risks, benefits and other options for treatment, the patient has consented to    Procedure(s): [name] (N/A) as a surgical intervention.    The patient's history has been reviewed, patient examined, no change in status, stable for surgery.  I have reviewed the patient's chart and labs.  Questions were answered to the patient's satisfaction.    [name], [facility] [place] [facility] Office: [place]

## 2020-11-23 ENCOUNTER — Encounter (HOSPITAL_COMMUNITY): Payer: Self-pay | Admitting: Surgery

## 2020-12-04 ENCOUNTER — Telehealth: Payer: Self-pay

## 2020-12-04 MED ORDER — TERBINAFINE HCL 250 MG PO TABS: ORAL_TABLET | ORAL | 2 refills | Status: DC

## 2020-12-04 NOTE — Telephone Encounter (Signed)
[facility] faxed refill request for the following medications:  terbinafine (LAMISIL) 250 MG tablet   Please advise.

## 2020-12-04 NOTE — Telephone Encounter (Signed)
LOV: 08/31/2020 NOV: 12/05/2020   Last Refill: 09/27/2020 #14 2 Refills.    Thanks,   -Mickel Baas

## 2020-12-05 ENCOUNTER — Other Ambulatory Visit: Payer: Self-pay

## 2020-12-05 ENCOUNTER — Other Ambulatory Visit: Payer: Self-pay | Admitting: Family Medicine

## 2020-12-05 ENCOUNTER — Ambulatory Visit (INDEPENDENT_AMBULATORY_CARE_PROVIDER_SITE_OTHER): Payer: Medicare HMO | Admitting: Family Medicine

## 2020-12-05 ENCOUNTER — Encounter: Payer: Self-pay | Admitting: Family Medicine

## 2020-12-05 VITALS — BP 171/87 | HR 85 | Temp 98.2°F | Resp 18 | Wt 239.0 lb

## 2020-12-05 DIAGNOSIS — R0683 Snoring: Secondary | ICD-10-CM

## 2020-12-05 DIAGNOSIS — R1032 Left lower quadrant pain: Secondary | ICD-10-CM | POA: Diagnosis not present

## 2020-12-05 DIAGNOSIS — I10 Essential (primary) hypertension: Secondary | ICD-10-CM | POA: Diagnosis not present

## 2020-12-05 DIAGNOSIS — G4710 Hypersomnia, unspecified: Secondary | ICD-10-CM

## 2020-12-05 MED ORDER — AMLODIPINE BESYLATE 5 MG PO TABS: 5.0000 mg | ORAL_TABLET | Freq: Every day | ORAL | 1 refills | Status: DC

## 2020-12-05 MED ORDER — AMLODIPINE BESYLATE 2.5 MG PO TABS: 5.0000 mg | ORAL_TABLET | Freq: Every day | ORAL | 0 refills | Status: DC

## 2020-12-05 NOTE — Progress Notes (Signed)
Established patient visit   Patient: Dustin Guerra   DOB: Aug 04, 1955   65 y.o. Male  MRN: [id number] Visit Date: 12/05/2020  Today's healthcare provider: Lelon Huh, MD   Chief Complaint  Patient presents with   Hypertension   Subjective    HPI  Hypertension, follow-up  BP Readings from Last 3 Encounters:  12/05/20 (!) 171/87  11/22/20 (!) 176/99  11/12/20 (!) 165/90   Wt Readings from Last 3 Encounters:  12/05/20 239 lb (108.4 kg)  11/22/20 233 lb 14.5 oz (106.1 kg)  11/12/20 234 lb (106.1 kg)     He was last seen for hypertension 3 months ago.  BP at that visit was 151/83. Management since that visit includes starting Amlodipine 2.48m daily.  He reports good compliance with treatment. He is not having side effects.  He is following a Regular diet. He is exercising. He does not smoke.  Use of agents associated with hypertension: NSAIDS.   Outside blood pressures are not checked. Symptoms: No chest pain No chest pressure  No palpitations No syncope  No dyspnea No orthopnea  No paroxysmal nocturnal dyspnea No lower extremity edema   Pertinent labs: Lab Results  Component Value Date   CHOL 123 08/31/2020   HDL 40 08/31/2020   LDLCALC 64 08/31/2020   TRIG 103 08/31/2020   CHOLHDL 3.1 08/31/2020   Lab Results  Component Value Date   NA 136 11/12/2020   K 4.3 11/12/2020   CREATININE 0.85 11/12/2020   EGFR 98 08/31/2020   GFRNONAA >60 11/12/2020   GFRAA 92 09/05/2019   GLUCOSE 153 (H) 11/12/2020     The ASCVD Risk score (Arnett DK, et al., 2019) failed to calculate for the following reasons:   The valid total cholesterol range is 130 to 320 mg/dL   ---------------------------------------------------------------------------------------------------  Abdominal pain: Patient complains of left lower quadrant abdominal pain that started 8 months ago. Pain worsens when having bowel movements. He has had pain off and on for several years since  having abdominal surgery in 2015.   He also reports that his family has complained of him snoring extremely loudly for several months.    Medications: Outpatient Medications Prior to Visit  Medication Sig   amLODipine (NORVASC) 2.5 MG tablet Take 1 tablet (2.5 mg total) by mouth daily.   Ascorbic Acid (VITAMIN C) 1000 MG tablet Take 1,000 mg by mouth daily.   Cholecalciferol (VITAMIN D3) 50 MCG (2000 UT) TABS Take 1 tablet by mouth daily.   cilostazol (PLETAL) 50 MG tablet TAKE ONE TABLET BY MOUTH TWICE A DAY   diazepam (VALIUM) 10 MG tablet TAKE ONE TABLET BY MOUTH DAILY AS NEEDED FOR ANXIETY   diclofenac Sodium (VOLTAREN) 1 % GEL Apply 2 g topically 4 (four) times daily.   EPINEPHrine 0.3 mg/0.3 mL IJ SOAJ injection as needed.    HYDROcodone-acetaminophen (NORCO) 7.5-325 MG tablet 1/2-1 tablet up to four times a day as needed   lansoprazole (PREVACID) 30 MG capsule TAKE ONE CAPSULE BY MOUTH DAILY AT 12 NOON (Patient taking differently: TAKE ONE CAPSULE BY MOUTH DAILY AT 5 pm)   Magnesium 400 MG TABS Take 1 tablet by mouth daily.   meclizine (ANTIVERT) 25 MG tablet Take 1 tablet (25 mg total) by mouth 3 (three) times daily as needed for dizziness.   meloxicam (MOBIC) 15 MG tablet Take 1 tablet (15 mg total) by mouth daily as needed.   pravastatin (PRAVACHOL) 40 MG tablet Take 1 tablet (40  mg total) by mouth daily. (Patient taking differently: Take 40 mg by mouth daily. Takes 20 mg daily)   pregabalin (LYRICA) 225 MG capsule Take 1 capsule (225 mg total) by mouth 3 (three) times daily.   psyllium (METAMUCIL) 58.6 % powder Take 1 packet by mouth 3 (three) times daily. Large spoonful in a cup of water per day   selenium 200 MCG TABS tablet Take 200 mcg by mouth daily.   sildenafil (VIAGRA) 100 MG tablet TAKE 0.5-1 TABLET BY MOUTH DAILY AS NEEDED FOR ERECTILE DYSFUNCTION   terbinafine (LAMISIL) 250 MG tablet TAKE TWO TABLETS BY MOUTH FOR SEVEN DAYS OF EACH MONTH   traMADol (ULTRAM) 50 MG  tablet Take 1 tablet (50 mg total) by mouth 3 (three) times daily as needed.   vitamin B-12 (CYANOCOBALAMIN) 500 MCG tablet Take 500 mcg by mouth daily.   vitamin E 180 MG (400 UNITS) capsule Take 400 Units by mouth daily.   Zinc 50 MG TABS Take 1 tablet by mouth daily.   aspirin 81 MG tablet Take by mouth. (Patient not taking: Reported on 12/05/2020)   docusate sodium (COLACE) 100 MG capsule Take 100 mg by mouth 2 (two) times daily. (Patient not taking: Reported on 12/05/2020)   No facility-administered medications prior to visit.    Review of Systems  Constitutional:  Negative for appetite change, chills and fever.  Respiratory:  Negative for chest tightness, shortness of breath and wheezing.   Cardiovascular:  Negative for chest pain and palpitations.  Gastrointestinal:  Positive for abdominal pain (LLQ pain x 8 months; worse with bowel movements). Negative for nausea and vomiting.      Objective    BP (!) 171/87 (BP Location: Right Arm, Patient Position: Sitting, Cuff Size: Large)   Pulse 85   Temp 98.2 F (36.8 C) (Temporal)   Resp 18   Wt 239 lb (108.4 kg)   BMI 30.69 kg/m  {Show previous vital signs (optional):23777} Today's Vitals   12/05/20 0942 12/05/20 0943  BP: (!) 161/89 (!) 171/87  Pulse: 85   Resp: 18   Temp: 98.2 F (36.8 C)   TempSrc: Temporal   Weight: 239 lb (108.4 kg)    Body mass index is 30.69 kg/m.    Physical Exam   General: Appearance:    Obese male in no acute distress  Eyes:    PERRL, conjunctiva/corneas clear, EOM's intact       Lungs:     Clear to auscultation bilaterally, respirations unlabored  Heart:    Normal heart rate. Normal rhythm. No murmurs, rubs, or gallops.    MS:   All extremities are intact.    Neurologic:   Awake, alert, oriented x 3. No apparent focal neurological defect.         Assessment & Plan     1. Essential hypertension Tolerating 2.23m amlodipine, BP improved but not to goal. Increase to  amLODipine (NORVASC)  2.5 MG tablet; Take 2 tablets (5 mg total) by mouth daily.  Dispense: 90 tablet; Refill: 0  Will send in prescription for 559mtablet to his mail order to take 1 daily when the 2.72m472mablets run out.   2. Left lower quadrant pain Chronic and increasing in frequency since abdominal surgery in 2015.  - CT Abdomen Pelvis W Contrast; Future  3. Hypersomnia  - Home sleep test  4. Loud snoring  - Home sleep test     Future Appointments  Date Time Provider DepLyman2/28/2022  9:40  AM Birdie Sons, MD [facility]        The entirety of the information documented in the History of Present Illness, Review of Systems and Physical Exam were personally obtained by me. Portions of this information were initially documented by the CMA and reviewed by me for thoroughness and accuracy.     Lelon Huh, MD  [facility] [phone] (phone) [phone] (fax)  [place]

## 2020-12-05 NOTE — Patient Instructions (Signed)
Please review the attached list of medications and notify my office if there are any errors.   Increase amlodipine to 2 of the 2.5mg  tablets every day. I'll send in a new prescription for 5mg  tablets to your mail order pharmacy. You'll just need to take take one of those a day when the 2.5 tablets run out.

## 2020-12-07 MED ORDER — TERBINAFINE HCL 250 MG PO TABS: ORAL_TABLET | ORAL | 2 refills | Status: DC

## 2020-12-07 NOTE — Addendum Note (Signed)
Addended by: Shawna Orleans on: 12/07/2020 01:49 PM   Modules accepted: Orders

## 2020-12-07 NOTE — Telephone Encounter (Signed)
This medication was supposed to go to [facility].  Looks like it was sent to [facility].  Can we send to the correct pharmacy.

## 2020-12-10 ENCOUNTER — Other Ambulatory Visit: Payer: Self-pay | Admitting: Family Medicine

## 2020-12-10 MED ORDER — MELOXICAM 15 MG PO TABS: 15.0000 mg | ORAL_TABLET | Freq: Every day | ORAL | 2 refills | Status: DC | PRN

## 2020-12-10 NOTE — Telephone Encounter (Signed)
[facility] faxed refill request for the following medications:   meloxicam (MOBIC) 15 MG tablet   Please advise.

## 2020-12-10 NOTE — Telephone Encounter (Signed)
LOV: 12/05/2020 NOV: 03/13/2021  Last Refill:  09/27/2020 #90 1 Refill.   Thanks,   -Mickel Baas

## 2020-12-17 ENCOUNTER — Telehealth: Payer: Self-pay

## 2020-12-17 DIAGNOSIS — G629 Polyneuropathy, unspecified: Secondary | ICD-10-CM

## 2020-12-17 NOTE — Telephone Encounter (Signed)
Patient said a prior authorization needs to be completed for Lyrica to be approved.   This is to be sent to [facility].He has 3 days left of pills so needs this done ASAP.

## 2020-12-17 NOTE — Telephone Encounter (Signed)
Patient is requesting refill on Hydrocodone 7.5 mg-325 mg. Sent to [facility].

## 2020-12-18 MED ORDER — HYDROCODONE-ACETAMINOPHEN 7.5-325 MG PO TABS: ORAL_TABLET | ORAL | 0 refills | Status: DC

## 2020-12-19 DIAGNOSIS — Z8719 Personal history of other diseases of the digestive system: Secondary | ICD-10-CM | POA: Insufficient documentation

## 2020-12-19 DIAGNOSIS — Z9889 Other specified postprocedural states: Secondary | ICD-10-CM | POA: Insufficient documentation

## 2020-12-25 ENCOUNTER — Ambulatory Visit
Admission: RE | Admit: 2020-12-25 | Discharge: 2020-12-25 | Disposition: A | Discharge status: Home / Self Care | Payer: Medicare HMO | Source: Ambulatory Visit | Attending: Family Medicine | Admitting: Family Medicine

## 2020-12-25 ENCOUNTER — Other Ambulatory Visit: Payer: Self-pay

## 2020-12-25 DIAGNOSIS — R1032 Left lower quadrant pain: Secondary | ICD-10-CM | POA: Insufficient documentation

## 2020-12-25 DIAGNOSIS — N281 Cyst of kidney, acquired: Secondary | ICD-10-CM | POA: Diagnosis not present

## 2020-12-25 DIAGNOSIS — K449 Diaphragmatic hernia without obstruction or gangrene: Secondary | ICD-10-CM | POA: Diagnosis not present

## 2020-12-25 DIAGNOSIS — K5900 Constipation, unspecified: Secondary | ICD-10-CM | POA: Diagnosis not present

## 2020-12-25 DIAGNOSIS — K5730 Diverticulosis of large intestine without perforation or abscess without bleeding: Secondary | ICD-10-CM | POA: Diagnosis not present

## 2020-12-25 LAB — POCT I-STAT CREATININE: Creatinine, Ser: 0.8 mg/dL (ref 0.61–1.24)

## 2020-12-25 MED ORDER — IOHEXOL 350 MG/ML SOLN
100.0000 mL | Freq: Once | INTRAVENOUS | Status: AC | PRN
  Administered 2020-12-25: 100 mL via INTRAVENOUS

## 2020-12-27 ENCOUNTER — Encounter: Payer: Self-pay | Admitting: Family Medicine

## 2020-12-27 DIAGNOSIS — I700 Atherosclerosis of aorta: Secondary | ICD-10-CM | POA: Insufficient documentation

## 2020-12-28 ENCOUNTER — Telehealth: Payer: Self-pay

## 2020-12-28 DIAGNOSIS — R1032 Left lower quadrant pain: Secondary | ICD-10-CM

## 2020-12-28 NOTE — Telephone Encounter (Signed)
Pt advised.  He agreed with the referral to GI.    Thanks,   -Mickel Baas

## 2020-12-28 NOTE — Telephone Encounter (Signed)
-----   Message from Birdie Sons, MD sent at 12/27/2020  9:04 AM EDT ----- Small gallstones, but nothing that would explain his abdominal pain. Recommend referral to GI for further evaluation.

## 2020-12-31 ENCOUNTER — Ambulatory Visit: Payer: Medicare HMO | Attending: Otolaryngology

## 2020-12-31 DIAGNOSIS — R0683 Snoring: Secondary | ICD-10-CM | POA: Insufficient documentation

## 2020-12-31 DIAGNOSIS — G4710 Hypersomnia, unspecified: Secondary | ICD-10-CM | POA: Insufficient documentation

## 2020-12-31 DIAGNOSIS — G4733 Obstructive sleep apnea (adult) (pediatric): Secondary | ICD-10-CM | POA: Diagnosis not present

## 2021-01-15 ENCOUNTER — Telehealth: Payer: Self-pay

## 2021-01-15 NOTE — Telephone Encounter (Signed)
Copied from [place] [id number]. Topic: General - Other >> Jan 15, 2021  3:34 PM Yvette Rack wrote: Reason for CRM: Pt called for update on the results of the sleep study. Pt requests call back. Cb# [phone]

## 2021-01-16 NOTE — Telephone Encounter (Signed)
Do you have the results of his sleep study?   Thanks,   -Mickel Baas

## 2021-01-21 ENCOUNTER — Other Ambulatory Visit: Payer: Self-pay

## 2021-01-21 DIAGNOSIS — G629 Polyneuropathy, unspecified: Secondary | ICD-10-CM

## 2021-01-21 MED ORDER — HYDROCODONE-ACETAMINOPHEN 7.5-325 MG PO TABS: ORAL_TABLET | ORAL | 0 refills | Status: DC

## 2021-01-21 NOTE — Telephone Encounter (Signed)
LOV: 12/05/2020  NOV: 03/13/2021   Last Refill 12/18/2020 #120 0 Refills.   Thanks,   Mickel Baas

## 2021-01-21 NOTE — Telephone Encounter (Signed)
Patient came by the office asking for sleep study results and also needs refill on Hydrocodone to be sent to [facility]

## 2021-01-25 ENCOUNTER — Other Ambulatory Visit: Payer: Self-pay

## 2021-02-08 ENCOUNTER — Encounter: Payer: Self-pay | Admitting: Family Medicine

## 2021-02-08 DIAGNOSIS — G4733 Obstructive sleep apnea (adult) (pediatric): Secondary | ICD-10-CM | POA: Insufficient documentation

## 2021-02-08 NOTE — Telephone Encounter (Signed)
He has moderate to severe sleep apnea. He needs order faxed to [facility] for  MPSG 12/2020 Moderate to severe OSA AHI=26.3. auto-titrating CPAP 4-20cm H2O

## 2021-02-12 NOTE — Telephone Encounter (Signed)
Pt advised.  The order for his Cpap has been faxed to [facility].  Fax number:  [phone].   Thanks,   -Mickel Baas

## 2021-02-17 ENCOUNTER — Other Ambulatory Visit: Payer: Self-pay | Admitting: Family Medicine

## 2021-02-17 NOTE — Telephone Encounter (Signed)
Requested medication (s) are due for refill today: yes  Requested medication (s) are on the active medication list: yes  Last refill:  12/07/20 #14 2 RF  Future visit scheduled: yes  Notes to clinic:  med not delegated to a protocol   Requested Prescriptions  Pending Prescriptions Disp Refills   terbinafine (LAMISIL) 250 MG tablet [Pharmacy Med Name: TERBINAFINE HCL 250 MG Tablet] 42 tablet     Sig: TAKE TWO TABLETS BY MOUTH FOR SEVEN DAYS OF EACH MONTH AS INSTRUCTED     Off-Protocol Failed - 02/17/2021  3:28 AM      Failed - Medication not assigned to a protocol, review manually.      Passed - Valid encounter within last 12 months    Recent Outpatient Visits           2 months ago Essential hypertension   [facility] Birdie Sons, MD   5 months ago Pre-diabetes   [facility] Birdie Sons, MD   11 months ago Pre-diabetes   [facility] Birdie Sons, MD   1 year ago Exudative tonsillitis   [place], PA-C   1 year ago Annual physical exam   [facility] Birdie Sons, MD       Future Appointments             In 3 weeks Fisher, Kirstie Peri, MD [facility], [place]

## 2021-02-19 ENCOUNTER — Other Ambulatory Visit: Payer: Self-pay | Admitting: Family Medicine

## 2021-02-19 DIAGNOSIS — G629 Polyneuropathy, unspecified: Secondary | ICD-10-CM

## 2021-02-19 NOTE — Telephone Encounter (Signed)
Medication Refill - Medication: HYDROcodone-acetaminophen  Has the patient contacted their pharmacy? No. (Agent: If no, request that the patient contact the pharmacy for the refill. If patient does not wish to contact the pharmacy document the reason why and proceed with request.)  The Medication is a controlled substance.  Preferred Pharmacy (with phone number or street name):  [facility] [id number], [place]  Phone:  [phone] Fax:  [phone]    Has the patient been seen for an appointment in the last year OR does the patient have an upcoming appointment? Yes.    Agent: Please be advised that RX refills may take up to 3 business days. We ask that you follow-up with your pharmacy.

## 2021-02-19 NOTE — Telephone Encounter (Signed)
Requested medications are due for refill today.  yes  Requested medications are on the active medications list.  yes  Last refill. 01/21/2021  Future visit scheduled.   yes  Notes to clinic.  Medication not delegated.    Requested Prescriptions  Pending Prescriptions Disp Refills   HYDROcodone-acetaminophen (NORCO) 7.5-325 MG tablet 120 tablet 0    Sig: 1/2-1 tablet up to four times a day as needed     Not Delegated - Analgesics:  Opioid Agonist Combinations Failed - 02/19/2021  4:08 PM      Failed - This refill cannot be delegated      Failed - Urine Drug Screen completed in last 360 days      Passed - Valid encounter within last 6 months    Recent Outpatient Visits           2 months ago Essential hypertension   [facility] Birdie Sons, MD   5 months ago Pre-diabetes   [facility] Birdie Sons, MD   11 months ago Pre-diabetes   [facility] Birdie Sons, MD   1 year ago Exudative tonsillitis   [place], PA-C   1 year ago Annual physical exam   [facility] Birdie Sons, MD       Future Appointments             In 3 weeks Fisher, Kirstie Peri, MD [facility], [place]

## 2021-02-20 MED ORDER — HYDROCODONE-ACETAMINOPHEN 7.5-325 MG PO TABS: ORAL_TABLET | ORAL | 0 refills | Status: DC

## 2021-02-25 ENCOUNTER — Ambulatory Visit: Payer: Medicare HMO | Admitting: Gastroenterology

## 2021-03-13 ENCOUNTER — Ambulatory Visit (INDEPENDENT_AMBULATORY_CARE_PROVIDER_SITE_OTHER): Payer: Medicare HMO | Admitting: Family Medicine

## 2021-03-13 ENCOUNTER — Other Ambulatory Visit: Payer: Self-pay

## 2021-03-13 ENCOUNTER — Encounter: Payer: Self-pay | Admitting: Family Medicine

## 2021-03-13 VITALS — BP 153/97 | HR 76 | Ht 74.0 in | Wt 242.6 lb

## 2021-03-13 DIAGNOSIS — N529 Male erectile dysfunction, unspecified: Secondary | ICD-10-CM

## 2021-03-13 DIAGNOSIS — I10 Essential (primary) hypertension: Secondary | ICD-10-CM

## 2021-03-13 MED ORDER — SILDENAFIL CITRATE 100 MG PO TABS: ORAL_TABLET | ORAL | 8 refills | Status: DC

## 2021-03-13 MED ORDER — HYDROCHLOROTHIAZIDE 12.5 MG PO TABS: 12.5000 mg | ORAL_TABLET | Freq: Every day | ORAL | 1 refills | Status: DC

## 2021-03-13 NOTE — Progress Notes (Signed)
Established patient visit   Patient: Dustin Guerra   DOB: [birth year]   65 y.o. Male  MRN: [id number] Visit Date: 03/13/2021  Today's healthcare provider: Lelon Huh, MD   Chief Complaint  Patient presents with   Follow-up   Hypertension   Hyperlipidemia   Subjective    HPI  Hypertension, follow-up  BP Readings from Last 3 Encounters:  12/05/20 (!) 171/87  11/22/20 (!) 176/99  11/12/20 (!) 165/90   Wt Readings from Last 3 Encounters:  12/05/20 239 lb (108.4 kg)  11/22/20 233 lb 14.5 oz (106.1 kg)  11/12/20 234 lb (106.1 kg)     He was last seen for hypertension 3 months ago.  BP at that visit was 171/87. Management since that visit includes amlodipine 2.5 mg 2 tabs po qd until they run out and then 5 mg po qd.  He reports excellent compliance with treatment. He is not having side effects.  He is following a Low Sodium diet. He is exercising. He does not smoke.  Use of agents associated with hypertension: none.   Outside blood pressures are none. Symptoms: No chest pain No chest pressure  No palpitations No syncope  No dyspnea No orthopnea  No paroxysmal nocturnal dyspnea No lower extremity edema   Pertinent labs: Lab Results  Component Value Date   CHOL 123 08/31/2020   HDL 40 08/31/2020   LDLCALC 64 08/31/2020   TRIG 103 08/31/2020   CHOLHDL 3.1 08/31/2020   Lab Results  Component Value Date   NA 136 11/12/2020   K 4.3 11/12/2020   CREATININE 0.80 12/25/2020   GFRNONAA >60 11/12/2020   GLUCOSE 153 (H) 11/12/2020   TSH 4.170 09/05/2019     The ASCVD Risk score (Arnett DK, et al., 2019) failed to calculate for the following reasons:   The valid total cholesterol range is 130 to 320 mg/dL   ---------------------------------------------------------------------------------------------------  Lipid/Cholesterol, Follow-up  Last lipid panel Other pertinent labs  Lab Results  Component Value Date   CHOL 123 08/31/2020   HDL 40  08/31/2020   LDLCALC 64 08/31/2020   TRIG 103 08/31/2020   CHOLHDL 3.1 08/31/2020   Lab Results  Component Value Date   ALT 17 08/31/2020   AST 16 08/31/2020   PLT 163 11/12/2020   TSH 4.170 09/05/2019     He was last seen for this 18 months ago. (09/05/19) Management since that visit includes continue statin.  He reports excellent compliance with treatment. He is not having side effects.   Symptoms: No chest pain No chest pressure/discomfort  No dyspnea No lower extremity edema  No numbness or tingling of extremity No orthopnea  No palpitations No paroxysmal nocturnal dyspnea  No speech difficulty No syncope   Current diet: low salt Current exercise: bicycling and weightlifting  The ASCVD Risk score (Arnett DK, et al., 2019) failed to calculate for the following reasons:   The valid total cholesterol range is 130 to 320 mg/dL  ---------------------------------------------------------------------------------------------------   Medications: Outpatient Medications Prior to Visit  Medication Sig   amLODipine (NORVASC) 5 MG tablet Take 1 tablet (5 mg total) by mouth daily.   Ascorbic Acid (VITAMIN C) 1000 MG tablet Take 1,000 mg by mouth daily.   aspirin 81 MG tablet Take by mouth.   Cholecalciferol (VITAMIN D3) 50 MCG (2000 UT) TABS Take 1 tablet by mouth daily.   cilostazol (PLETAL) 50 MG tablet TAKE ONE TABLET BY MOUTH TWICE A DAY   diazepam (VALIUM)  10 MG tablet TAKE ONE TABLET BY MOUTH DAILY AS NEEDED FOR ANXIETY   diclofenac Sodium (VOLTAREN) 1 % GEL Apply 2 g topically 4 (four) times daily.   docusate sodium (COLACE) 100 MG capsule Take 100 mg by mouth 2 (two) times daily.   EPINEPHrine 0.3 mg/0.3 mL IJ SOAJ injection as needed.    HYDROcodone-acetaminophen (NORCO) 7.5-325 MG tablet 1/2-1 tablet up to four times a day as needed   lansoprazole (PREVACID) 30 MG capsule TAKE ONE CAPSULE BY MOUTH DAILY AT 12 NOON (Patient taking differently: TAKE ONE CAPSULE BY MOUTH  DAILY AT 5 pm)   Magnesium 400 MG TABS Take 1 tablet by mouth daily.   meclizine (ANTIVERT) 25 MG tablet Take 1 tablet (25 mg total) by mouth 3 (three) times daily as needed for dizziness.   meloxicam (MOBIC) 15 MG tablet Take 1 tablet (15 mg total) by mouth daily as needed.   pravastatin (PRAVACHOL) 40 MG tablet Take 1 tablet (40 mg total) by mouth daily. (Patient taking differently: Take 40 mg by mouth daily. Takes 20 mg daily)   pregabalin (LYRICA) 225 MG capsule Take 1 capsule (225 mg total) by mouth 3 (three) times daily.   psyllium (METAMUCIL) 58.6 % powder Take 1 packet by mouth 3 (three) times daily. Large spoonful in a cup of water per day   selenium 200 MCG TABS tablet Take 200 mcg by mouth daily.   sildenafil (VIAGRA) 100 MG tablet TAKE 0.5-1 TABLET BY MOUTH DAILY AS NEEDED FOR ERECTILE DYSFUNCTION   terbinafine (LAMISIL) 250 MG tablet TAKE TWO TABLETS BY MOUTH FOR SEVEN DAYS OF EACH MONTH AS INSTRUCTED   traMADol (ULTRAM) 50 MG tablet Take 1 tablet (50 mg total) by mouth 3 (three) times daily as needed.   vitamin B-12 (CYANOCOBALAMIN) 500 MCG tablet Take 500 mcg by mouth daily.   vitamin E 180 MG (400 UNITS) capsule Take 400 Units by mouth daily.   Zinc 50 MG TABS Take 1 tablet by mouth daily.   No facility-administered medications prior to visit.    Last vitamin B12 and Folate Lab Results  Component Value Date   VITAMINB12 779 08/31/2020   FOLATE 3.4 08/31/2020       Objective    BP (!) 153/97 (BP Location: Right Arm, Patient Position: Sitting, Cuff Size: Large)    Pulse 76    Ht 6\' 2"  (1.88 m)    Wt 242 lb 9.6 oz (110 kg)    SpO2 97%    BMI 31.15 kg/m  BP Readings from Last 3 Encounters:  12/05/20 (!) 171/87  11/22/20 (!) 176/99  11/12/20 (!) 165/90   Wt Readings from Last 3 Encounters:  12/05/20 239 lb (108.4 kg)  11/22/20 233 lb 14.5 oz (106.1 kg)  11/12/20 234 lb (106.1 kg)     Physical Exam  General appearance: Mildly obese male, cooperative and in no acute  distress Head: Normocephalic, without obvious abnormality, atraumatic Respiratory: Respirations even and unlabored, normal respiratory rate Extremities: All extremities are intact.  Skin: Skin color, texture, turgor normal. No rashes seen  Psych: Appropriate mood and affect. Neurologic: Mental status: Alert, oriented to person, place, and time, thought content appropriate.   No results found for any visits on 03/13/21.  Assessment & Plan     1. Essential hypertension Doing better with titration of amlodipine to 5mg . Will add hydrochlorothiazide (HYDRODIURIL) 12.5 MG tablet; Take 1 tablet (12.5 mg total) by mouth daily.  Dispense: 90 tablet; Refill: 1  Future Appointments  Date Time Provider [place]  04/16/2021  1:45 PM Jonathon Bellows, MD [facility] None  06/04/2021 10:40 AM Caryn Section Kirstie Peri, MD [facility]    2. Erectile dysfunction, unspecified erectile dysfunction type Refill  sildenafil (VIAGRA) 100 MG tablet; TAKE 0.5-1 TABLET BY MOUTH DAILY AS NEEDED FOR ERECTILE DYSFUNCTION  Dispense: 10 tablet; Refill: 8         The entirety of the information documented in the History of Present Illness, Review of Systems and Physical Exam were personally obtained by me. Portions of this information were initially documented by the CMA and reviewed by me for thoroughness and accuracy.     Lelon Huh, MD  [facility] [phone] (phone) [phone] (fax)  [place]

## 2021-03-21 ENCOUNTER — Other Ambulatory Visit: Payer: Self-pay | Admitting: Family Medicine

## 2021-03-21 DIAGNOSIS — I10 Essential (primary) hypertension: Secondary | ICD-10-CM

## 2021-03-21 DIAGNOSIS — G629 Polyneuropathy, unspecified: Secondary | ICD-10-CM

## 2021-03-21 DIAGNOSIS — I739 Peripheral vascular disease, unspecified: Secondary | ICD-10-CM

## 2021-03-21 MED ORDER — PRAVASTATIN SODIUM 40 MG PO TABS: 40.0000 mg | ORAL_TABLET | Freq: Every day | ORAL | 1 refills | Status: DC

## 2021-03-21 MED ORDER — AMLODIPINE BESYLATE 5 MG PO TABS: 5.0000 mg | ORAL_TABLET | Freq: Every day | ORAL | 1 refills | Status: DC

## 2021-03-21 NOTE — Telephone Encounter (Signed)
Medication Refill - Medication: HYDROcodone-acetaminophen (NORCO) 7.5-325 MG tablet  Has the patient contacted their pharmacy? No. Stated pharmacy always tells him to contact PCP. (Agent: If no, request that the patient contact the pharmacy for the refill. If patient does not wish to contact the pharmacy document the reason why and proceed with request.)  Preferred Pharmacy (with phone number or street name):   [facility] [id number], [place]  [place] [address]  Phone: [phone] Fax: [phone]  Hours: Not open 24 hours    Has the patient been seen for an appointment in the last year OR does the patient have an upcoming appointment? Yes.    Agent: Please be advised that RX refills may take up to 3 business days. We ask that you follow-up with your pharmacy.

## 2021-03-21 NOTE — Telephone Encounter (Signed)
Patient needs refills on Amlodipine 5 mg., Cilostazol 50 mg, Diazepam 10 mg., Pravastatin 40 mg., Pregabalin 225 mg. As well as Hydrocodone - Acet   Sent to [facility].

## 2021-03-22 MED ORDER — CILOSTAZOL 50 MG PO TABS: 50.0000 mg | ORAL_TABLET | Freq: Two times a day (BID) | ORAL | 4 refills | Status: DC

## 2021-03-22 MED ORDER — PREGABALIN 225 MG PO CAPS: 225.0000 mg | ORAL_CAPSULE | Freq: Three times a day (TID) | ORAL | 4 refills | Status: DC

## 2021-03-22 MED ORDER — HYDROCODONE-ACETAMINOPHEN 7.5-325 MG PO TABS: ORAL_TABLET | ORAL | 0 refills | Status: DC

## 2021-03-22 MED ORDER — DIAZEPAM 10 MG PO TABS: ORAL_TABLET | ORAL | 1 refills | Status: DC

## 2021-03-22 NOTE — Telephone Encounter (Signed)
Requested medication (s) are due for refill today: has current rx for Lyrica at different pharm (not delegated) , pletal current at requesting pharm, Norco and Valium not delegated.  Requested medication (s) are on the active medication list: yes  Future visit scheduled: 06/04/21  Notes to clinic:  These medications (except Pletal)  can not be delegated, please assess.     Requested Prescriptions  Pending Prescriptions Disp Refills   HYDROcodone-acetaminophen (NORCO) 7.5-325 MG tablet 120 tablet 0    Sig: 1/2-1 tablet up to four times a day as needed     Not Delegated - Analgesics:  Opioid Agonist Combinations Failed - 03/21/2021  4:56 PM      Failed - This refill cannot be delegated      Failed - Urine Drug Screen completed in last 360 days      Passed - Valid encounter within last 6 months    Recent Outpatient Visits           1 week ago Essential hypertension   [facility] Birdie Sons, MD   3 months ago Essential hypertension   [facility] Birdie Sons, MD   6 months ago Pre-diabetes   [facility] Birdie Sons, MD   1 year ago [place], Donald E, MD   1 year ago Exudative tonsillitis   [place], Vickki Muff, PA-C               cilostazol (PLETAL) 50 MG tablet 180 tablet 4    Sig: Take 1 tablet (50 mg total) by mouth 2 (two) times daily.     Hematology: Antiplatelets - cilostazol Failed - 03/21/2021  4:56 PM      Failed - HCT in normal range and within 180 days    HCT  Date Value Ref Range Status  11/12/2020 38.7 (L) 39.0 - 52.0 % Final   Hematocrit  Date Value Ref Range Status  08/31/2020 36.1 (L) 37.5 - 51.0 % Final          Failed - HGB in normal range and within 180 days    Hemoglobin  Date Value Ref Range Status  11/12/2020 12.9 (L) 13.0 - 17.0 g/dL Final  08/31/2020 11.9 (L) 13.0 - 17.7 g/dL Final          Failed - WBC in normal  range and within 180 days    WBC  Date Value Ref Range Status  11/12/2020 3.7 (L) 4.0 - 10.5 K/uL Final          Passed - PLT in normal range and within 180 days    Platelets  Date Value Ref Range Status  11/12/2020 163 150 - 400 K/uL Final  08/31/2020 153 150 - 450 x10E3/uL Final          Passed - Valid encounter within last 6 months    Recent Outpatient Visits           1 week ago Essential hypertension   [facility] Birdie Sons, MD   3 months ago Essential hypertension   [facility] Birdie Sons, MD   6 months ago [place], Donald E, MD   1 year ago Pre-diabetes   [facility] Birdie Sons, MD   1 year ago Exudative tonsillitis   [place], Vickki Muff, [place]  diazepam (VALIUM) 10 MG tablet 90 tablet 1    Sig: TAKE ONE TABLET BY MOUTH DAILY AS NEEDED FOR ANXIETY     Not Delegated - Psychiatry:  Anxiolytics/Hypnotics Failed - 03/21/2021  4:56 PM      Failed - This refill cannot be delegated      Failed - Urine Drug Screen completed in last 360 days      Passed - Valid encounter within last 6 months    Recent Outpatient Visits           1 week ago Essential hypertension   [facility] Birdie Sons, MD   3 months ago Essential hypertension   [facility] Birdie Sons, MD   6 months ago Pre-diabetes   [facility] Birdie Sons, MD   1 year ago [place], Donald E, MD   1 year ago Exudative tonsillitis   [place], Vickki Muff, PA-C               pregabalin (LYRICA) 225 MG capsule 270 capsule 4    Sig: Take 1 capsule (225 mg total) by mouth 3 (three) times daily.     Not Delegated - Neurology:  Anticonvulsants - Controlled Failed - 03/21/2021  4:56 PM      Failed - This refill cannot be delegated       Passed - Valid encounter within last 12 months    Recent Outpatient Visits           1 week ago Essential hypertension   [facility] Birdie Sons, MD   3 months ago Essential hypertension   [facility] Birdie Sons, MD   6 months ago Pre-diabetes   [facility] Birdie Sons, MD   1 year ago [place], Donald E, MD   1 year ago Exudative tonsillitis   [place], Vickki Muff, PA-C              Signed Prescriptions Disp Refills   amLODipine (NORVASC) 5 MG tablet 90 tablet 1    Sig: Take 1 tablet (5 mg total) by mouth daily.     There is no refill protocol information for this order     pravastatin (PRAVACHOL) 40 MG tablet 90 tablet 1    Sig: Take 1 tablet (40 mg total) by mouth daily.     There is no refill protocol information for this order

## 2021-04-08 ENCOUNTER — Other Ambulatory Visit: Payer: Self-pay

## 2021-04-08 ENCOUNTER — Telehealth: Payer: Self-pay

## 2021-04-08 MED ORDER — MELOXICAM 15 MG PO TABS: 15.0000 mg | ORAL_TABLET | Freq: Every day | ORAL | 4 refills | Status: DC | PRN

## 2021-04-08 NOTE — Telephone Encounter (Signed)
Patient is asking for refill on Meloxicam to be sent to [facility].

## 2021-04-16 ENCOUNTER — Other Ambulatory Visit: Payer: Self-pay

## 2021-04-16 ENCOUNTER — Ambulatory Visit: Payer: Medicare HMO | Admitting: Gastroenterology

## 2021-04-25 ENCOUNTER — Other Ambulatory Visit: Payer: Self-pay | Admitting: Family Medicine

## 2021-04-25 DIAGNOSIS — G629 Polyneuropathy, unspecified: Secondary | ICD-10-CM

## 2021-04-25 MED ORDER — HYDROCODONE-ACETAMINOPHEN 7.5-325 MG PO TABS: ORAL_TABLET | ORAL | 0 refills | Status: DC

## 2021-04-25 NOTE — Telephone Encounter (Signed)
Requested medication (s) are due for refill today: yes  Requested medication (s) are on the active medication list: yes  Last refill:  03/22/21 #120/0  Future visit scheduled: yes  Notes to clinic:  Unable to refill per protocol, cannot delegate.      Requested Prescriptions  Pending Prescriptions Disp Refills   HYDROcodone-acetaminophen (NORCO) 7.5-325 MG tablet 120 tablet 0    Sig: 1/2-1 tablet up to four times a day as needed     Not Delegated - Analgesics:  Opioid Agonist Combinations Failed - 04/25/2021  1:04 PM      Failed - This refill cannot be delegated      Failed - Urine Drug Screen completed in last 360 days      Passed - Valid encounter within last 3 months    Recent Outpatient Visits           1 month ago Essential hypertension   [facility] Birdie Sons, MD   4 months ago Essential hypertension   [facility] Birdie Sons, MD   7 months ago Pre-diabetes   [facility] Birdie Sons, MD   1 year ago [place], Donald E, MD   1 year ago Exudative tonsillitis   [place], [place]

## 2021-04-25 NOTE — Telephone Encounter (Signed)
Medication Refill - Medication:HYDROcodone-acetaminophen (NORCO) 7.5-325 MG tablet  Has the patient contacted their pharmacy? yes (Agent: If no, request that the patient contact the pharmacy for the refill. If patient does not wish to contact the pharmacy document the reason why and proceed with request.) (Agent: If yes, when and what did the pharmacy advise?)contatc pcp  Preferred Pharmacy (with phone number or street name):    [facility] [id number], [place]  Phone:  [phone] Fax:  [phone] Has the patient been seen for an appointment in the last year OR does the patient have an upcoming appointment? yes  Agent: Please be advised that RX refills may take up to 3 business days. We ask that you follow-up with your pharmacy.

## 2021-04-29 ENCOUNTER — Ambulatory Visit (INDEPENDENT_AMBULATORY_CARE_PROVIDER_SITE_OTHER): Payer: Medicare HMO | Admitting: Family Medicine

## 2021-04-29 ENCOUNTER — Other Ambulatory Visit: Payer: Self-pay

## 2021-04-29 ENCOUNTER — Encounter: Payer: Self-pay | Admitting: Family Medicine

## 2021-04-29 VITALS — BP 142/86 | HR 81 | Temp 98.0°F | Resp 16 | Wt 249.0 lb

## 2021-04-29 DIAGNOSIS — J0110 Acute frontal sinusitis, unspecified: Secondary | ICD-10-CM | POA: Diagnosis not present

## 2021-04-29 MED ORDER — AMOXICILLIN-POT CLAVULANATE 875-125 MG PO TABS: 1.0000 | ORAL_TABLET | Freq: Two times a day (BID) | ORAL | 0 refills | Status: DC

## 2021-04-29 MED ORDER — FLUTICASONE PROPIONATE 50 MCG/ACT NA SUSP: 2.0000 | Freq: Every day | NASAL | 0 refills | Status: DC

## 2021-04-29 NOTE — Progress Notes (Signed)
I,Roshena L Chambers,acting as a scribe for Lelon Huh, MD.,have documented all relevant documentation on the behalf of Lelon Huh, MD,as directed by  Lelon Huh, MD while in the presence of Lelon Huh, MD.   Established patient visit   Patient: Dustin Guerra   DOB: [birth year]   66 y.o. Male  MRN: [id number] Visit Date: 04/29/2021  Today's healthcare provider: Lelon Huh, MD   Chief Complaint  Patient presents with   Chest Congestion   Subjective    HPI  Chest congestion: Patient complains of chest congestion for the past 4 weeks. Symptoms started as sinus congestion, which has now moved into his chest. He reports he noticed symptoms started after using his new CPAP machine. He has tried Robitussin DM with no improvement.He denies any fever, shortness of breath or cough. He has been able to hack up thick yellow -white sputum. States it started with sinus congestion and pressure, which moved to his chest, but has been a little more congested in his sinuses again over the last week.   Medications: Outpatient Medications Prior to Visit  Medication Sig   amLODipine (NORVASC) 5 MG tablet Take 1 tablet (5 mg total) by mouth daily.   Ascorbic Acid (VITAMIN C) 1000 MG tablet Take 1,000 mg by mouth daily.   aspirin 81 MG EC tablet Take by mouth.   Azelaic Acid 15 % FOAM Apply topically.   Cholecalciferol (VITAMIN D3) 50 MCG (2000 UT) TABS Take 1 tablet by mouth daily.   cilostazol (PLETAL) 50 MG tablet Take 1 tablet (50 mg total) by mouth 2 (two) times daily.   diazepam (VALIUM) 10 MG tablet Take 1 tablet by mouth daily as needed.   diclofenac Sodium (VOLTAREN) 1 % GEL Apply 2 g topically 4 (four) times daily.   EPINEPHrine 0.3 mg/0.3 mL IJ SOAJ injection as needed.    hydrochlorothiazide (HYDRODIURIL) 12.5 MG tablet Take 1 tablet (12.5 mg total) by mouth daily.   HYDROcodone-acetaminophen (NORCO) 7.5-325 MG tablet 1/2-1 tablet up to four times a day as needed    lansoprazole (PREVACID) 30 MG capsule TAKE ONE CAPSULE BY MOUTH DAILY AT 12 NOON (Patient taking differently: TAKE ONE CAPSULE BY MOUTH DAILY AT 5 pm)   magnesium oxide (MAG-OX) 400 MG tablet Take 1 tablet by mouth daily.   meclizine (ANTIVERT) 25 MG tablet Take 1 tablet (25 mg total) by mouth 3 (three) times daily as needed for dizziness.   meloxicam (MOBIC) 15 MG tablet Take 1 tablet (15 mg total) by mouth daily as needed.   pravastatin (PRAVACHOL) 40 MG tablet Take 1 tablet (40 mg total) by mouth daily.   pregabalin (LYRICA) 225 MG capsule Take 1 capsule (225 mg total) by mouth 3 (three) times daily.   psyllium (METAMUCIL) 58.6 % powder Take 1 packet by mouth 3 (three) times daily. Large spoonful in a cup of water per day   ramipril (ALTACE) 10 MG capsule    selenium 200 MCG TABS tablet Take 200 mcg by mouth daily.   sildenafil (VIAGRA) 100 MG tablet TAKE 0.5-1 TABLET BY MOUTH DAILY AS NEEDED FOR ERECTILE DYSFUNCTION   terbinafine (LAMISIL) 250 MG tablet TAKE TWO TABLETS BY MOUTH FOR SEVEN DAYS OF EACH MONTH AS INSTRUCTED   traMADol (ULTRAM) 50 MG tablet Take 1 tablet (50 mg total) by mouth 3 (three) times daily as needed.   vitamin B-12 (CYANOCOBALAMIN) 500 MCG tablet Take 500 mcg by mouth daily.   vitamin E 180 MG (400 UNITS)  capsule Take 400 Units by mouth daily.   Zinc Sulfate 220 (50 Zn) MG TABS Take 1 tablet by mouth daily.   [DISCONTINUED] docusate sodium (COLACE) 100 MG capsule Take 100 mg by mouth 2 (two) times daily. (Patient not taking: Reported on 04/29/2021)   [DISCONTINUED] IRON CARBONYL-VITAMIN C-FOS PO Take by mouth. (Patient not taking: Reported on 04/29/2021)   No facility-administered medications prior to visit.         Objective    BP (!) 142/86 (BP Location: Right Arm, Patient Position: Sitting, Cuff Size: Large)    Pulse 81    Temp 98 F (36.7 C) (Oral)    Resp 16    Wt 249 lb (112.9 kg)    SpO2 97% Comment: room air   BMI 31.97 kg/m  {Show previous vital signs  (optional):23777} Today's Vitals   04/29/21 1509 04/29/21 1516  BP: (!) 142/82 (!) 142/86  Pulse: 81   Resp: 16   Temp: 98 F (36.7 C)   TempSrc: Oral   SpO2: 97%   Weight: 249 lb (112.9 kg)    Body mass index is 31.97 kg/m.   Physical Exam   General Appearance:    Mildly obese male, alert, cooperative, in no acute distress  HENT:   neck without nodes, throat normal without erythema or exudate, nasal mucosa congested, and nasal mucosa pale and congested  Eyes:    PERRL, conjunctiva/corneas clear, EOM's intact       Lungs:     Clear to auscultation bilaterally, respirations unlabored  Heart:    Normal heart rate. Normal rhythm. No murmurs, rubs, or gallops.    Neurologic:   Awake, alert, oriented x 3. No apparent focal neurological           defect.         Assessment & Plan     1. Acute frontal sinusitis, recurrence not specified  - amoxicillin-clavulanate (AUGMENTIN) 875-125 MG tablet; Take 1 tablet by mouth 2 (two) times daily.  Dispense: 20 tablet; Refill: 0 - fluticasone (FLONASE) 50 MCG/ACT nasal spray; Place 2 sprays into both nostrils daily.  Dispense: 16 g; Refill: 0    Call if symptoms change or if not rapidly improving.        The entirety of the information documented in the History of Present Illness, Review of Systems and Physical Exam were personally obtained by me. Portions of this information were initially documented by the CMA and reviewed by me for thoroughness and accuracy.     Lelon Huh, MD  [facility] [phone] (phone) [phone] (fax)  [place]

## 2021-05-24 ENCOUNTER — Other Ambulatory Visit: Payer: Self-pay | Admitting: Family Medicine

## 2021-05-24 DIAGNOSIS — G629 Polyneuropathy, unspecified: Secondary | ICD-10-CM

## 2021-05-24 NOTE — Telephone Encounter (Signed)
Requested medication (s) are due for refill today: yes ? ?Requested medication (s) are on the active medication list: yes ? ?Last refill:  04/25/21 #120 0 refills ? ?Future visit scheduled: no ? ?Notes to clinic:  not delegated per protocol.  ? ? ?  ?Requested Prescriptions  ?Pending Prescriptions Disp Refills  ? HYDROcodone-acetaminophen (NORCO) 7.5-325 MG tablet 120 tablet 0  ?  Sig: 1/2-1 tablet up to four times a day as needed  ?  ? Not Delegated - Analgesics:  Opioid Agonist Combinations Failed - 05/24/2021 11:41 AM  ?  ?  Failed - This refill cannot be delegated  ?  ?  Failed - Urine Drug Screen completed in last 360 days  ?  ?  Passed - Valid encounter within last 3 months  ?  Recent Outpatient Visits   ? ?      ? 3 weeks ago Acute frontal sinusitis, recurrence not specified  ? [facility] Birdie Sons, MD  ? 2 months ago Essential hypertension  ? [facility] Birdie Sons, MD  ? 5 months ago Essential hypertension  ? [facility] Birdie Sons, MD  ? 8 months ago Pre-diabetes  ? [facility] Caryn Section, Kirstie Peri, MD  ? 1 year ago Pre-diabetes  ? [facility] Caryn Section, Kirstie Peri, MD  ? ?  ?  ? ?  ?  ?  ? ?

## 2021-05-24 NOTE — Telephone Encounter (Signed)
Requested medication (s) are due for refill today: yes ? ?Requested medication (s) are on the active medication list: yes ? ?Last refill:  10/30/20 #90/4 ? ?Future visit scheduled: no ? ?Notes to clinic:  Unable to refill per protocol, cannot delegate. ? ? ? ?  ?Requested Prescriptions  ?Pending Prescriptions Disp Refills  ? traMADol (ULTRAM) 50 MG tablet [Pharmacy Med Name: traMADol HCL '50MG'$  TABLET] 90 tablet   ?  Sig: TAKE ONE TABLET BY MOUTH THREE TIMES A DAY AS NEEDED  ?  ? Not Delegated - Analgesics:  Opioid Agonists Failed - 05/24/2021  2:36 PM  ?  ?  Failed - This refill cannot be delegated  ?  ?  Failed - Urine Drug Screen completed in last 360 days  ?  ?  Passed - Valid encounter within last 3 months  ?  Recent Outpatient Visits   ? ?      ? 3 weeks ago Acute frontal sinusitis, recurrence not specified  ? [facility] Birdie Sons, MD  ? 2 months ago Essential hypertension  ? [facility] Birdie Sons, MD  ? 5 months ago Essential hypertension  ? [facility] Birdie Sons, MD  ? 8 months ago Pre-diabetes  ? [facility] Caryn Section, Kirstie Peri, MD  ? 1 year ago Pre-diabetes  ? [facility] Caryn Section, Kirstie Peri, MD  ? ?  ?  ? ?  ?  ?  ? ?

## 2021-05-24 NOTE — Telephone Encounter (Signed)
Copied from [place] [phone]. Topic: Quick Communication - Rx Refill/Question ?>> May 24, 2021 10:50 AM Leward Quan A wrote: ?Medication: HYDROcodone-acetaminophen (NORCO) 7.5-325 MG tablet  ? ?Has the patient contacted their pharmacy? No. Since its controlled ?(Agent: If no, request that the patient contact the pharmacy for the refill. If patient does not wish to contact the pharmacy document the reason why and proceed with request.) ?(Agent: If yes, when and what did the pharmacy advise?) ? ?Preferred Pharmacy (with phone number or street name): [facility] [id number], [place]  ?Phone:  [phone] ?Fax:  [phone] ? ? ? ?Has the patient been seen for an appointment in the last year OR does the patient have an upcoming appointment? Yes.   ? ?Agent: Please be advised that RX refills may take up to 3 business days. We ask that you follow-up with your pharmacy. ?

## 2021-05-25 MED ORDER — HYDROCODONE-ACETAMINOPHEN 7.5-325 MG PO TABS: ORAL_TABLET | ORAL | 0 refills | Status: DC

## 2021-06-04 ENCOUNTER — Encounter: Payer: Self-pay | Admitting: Family Medicine

## 2021-06-04 ENCOUNTER — Ambulatory Visit (INDEPENDENT_AMBULATORY_CARE_PROVIDER_SITE_OTHER): Payer: Medicare HMO | Admitting: Family Medicine

## 2021-06-04 ENCOUNTER — Other Ambulatory Visit: Payer: Self-pay

## 2021-06-04 VITALS — BP 138/77 | HR 63 | Temp 98.0°F | Resp 16 | Ht 74.0 in | Wt 246.0 lb

## 2021-06-04 DIAGNOSIS — F3342 Major depressive disorder, recurrent, in full remission: Secondary | ICD-10-CM | POA: Diagnosis not present

## 2021-06-04 DIAGNOSIS — I10 Essential (primary) hypertension: Secondary | ICD-10-CM

## 2021-06-04 DIAGNOSIS — D509 Iron deficiency anemia, unspecified: Secondary | ICD-10-CM | POA: Diagnosis not present

## 2021-06-04 DIAGNOSIS — G4733 Obstructive sleep apnea (adult) (pediatric): Secondary | ICD-10-CM | POA: Diagnosis not present

## 2021-06-04 DIAGNOSIS — E785 Hyperlipidemia, unspecified: Secondary | ICD-10-CM | POA: Diagnosis not present

## 2021-06-04 DIAGNOSIS — E1143 Type 2 diabetes mellitus with diabetic autonomic (poly)neuropathy: Secondary | ICD-10-CM | POA: Diagnosis not present

## 2021-06-04 DIAGNOSIS — Z0000 Encounter for general adult medical examination without abnormal findings: Secondary | ICD-10-CM

## 2021-06-04 DIAGNOSIS — I739 Peripheral vascular disease, unspecified: Secondary | ICD-10-CM | POA: Diagnosis not present

## 2021-06-04 NOTE — Progress Notes (Signed)
? ? ? ?I,Dustin Guerra,acting as a scribe for Dustin Huh, MD.,have documented all relevant documentation on the behalf of Dustin Huh, MD,as directed by  Dustin Huh, MD while in the presence of Dustin Huh, MD.  ? ?Complete Physical Exam  ? ?  ? ?Patient: Dustin Guerra, Male    DOB: 1955-06-27, 66 y.o.   MRN: [id number] ?Visit Date: 06/04/2021 ? ?Today's Provider: Lelon Huh, MD  ? ?Chief Complaint  ?Patient presents with  ? Medicare Wellness  ? Hypertension  ? ?Subjective  ?  ?Dustin Guerra is a 66 y.o. male who presents today for his complete physical examination ? ?He reports consuming a general diet. Home exercise routine includes yardwork. He generally feels fairly well. He reports sleeping fairly well. He does have additional problems to discuss today (prostate concerns).  ? ?HPI ?Hypertension, follow-up ? ?BP Readings from Last 3 Encounters:  ?06/04/21 138/77  ?04/29/21 (!) 142/86  ?03/13/21 (!) 153/97  ? Wt Readings from Last 3 Encounters:  ?06/04/21 246 lb (111.6 kg)  ?04/29/21 249 lb (112.9 kg)  ?03/13/21 242 lb 9.6 oz (110 kg)  ?  ? ?He was last seen for hypertension 3 months ago.  ?BP at that visit was 153/97. Management since that visit includes add hydrochlorothiazide (HYDRODIURIL) 12.5 MG tablet daily. ? ?He reports good compliance with treatment. ?He is not having side effects.  ?He is following a Regular diet. ?He is exercising. ?He does not smoke. ? ?Use of agents associated with hypertension: NSAIDS.  ? ?Outside blood pressures are not checked. ?Symptoms: ?No chest pain No chest pressure  ?No palpitations No syncope  ?No dyspnea No orthopnea  ?No paroxysmal nocturnal dyspnea No lower extremity edema  ? ?Pertinent labs: ?Lab Results  ?Component Value Date  ? CHOL 123 08/31/2020  ? HDL 40 08/31/2020  ? [place] 64 08/31/2020  ? TRIG 103 08/31/2020  ? CHOLHDL 3.1 08/31/2020  ? Lab Results  ?Component Value Date  ? NA 136 11/12/2020  ? K 4.3 11/12/2020  ? CREATININE  0.80 12/25/2020  ? GFRNONAA >60 11/12/2020  ? GLUCOSE 153 (H) 11/12/2020  ? TSH 4.170 09/05/2019  ?  ? ?The ASCVD Risk score (Arnett DK, et al., 2019) failed to calculate for the following reasons: ?  The valid total cholesterol range is 130 to 320 mg/dL  ? ?---------------------------------------------------------------------------------------------------  ? ?Prediabetes, Follow-up ? ?Lab Results  ?Component Value Date  ? HGBA1C 6.2 (H) 11/12/2020  ? HGBA1C 6.0 (A) 08/31/2020  ? HGBA1C 5.5 03/12/2020  ? GLUCOSE 153 (H) 11/12/2020  ? GLUCOSE 151 (H) 08/31/2020  ? GLUCOSE 128 (H) 09/05/2019  ? ? ?Last seen for for this 9 months ago.  ?Management since that visit includes continuing lifestyle modifications. ?Current symptoms include none and have been stable. ? ?Prior visit with dietician: no ?Current diet: in general, a "healthy" diet   ?Current exercise: yard work ? ? ?-----------------------------------------------------------------------------------------  ? ?Lipid/Cholesterol, Follow-up ? ?Last lipid panel Other pertinent labs  ?Lab Results  ?Component Value Date  ? CHOL 123 08/31/2020  ? HDL 40 08/31/2020  ? [place] 64 08/31/2020  ? TRIG 103 08/31/2020  ? CHOLHDL 3.1 08/31/2020  ? Lab Results  ?Component Value Date  ? ALT 17 08/31/2020  ? AST 16 08/31/2020  ? PLT 163 11/12/2020  ? TSH 4.170 09/05/2019  ?  ? ?He was last seen for this more than 1 year ago.   ?Management since that visit includes continuing statin. ? ?He reports good compliance  with treatment. ?He is not having side effects.  ? ?Symptoms: ?No chest pain No chest pressure/discomfort  ?No dyspnea No lower extremity edema  ?No numbness or tingling of extremity No orthopnea  ?No palpitations No paroxysmal nocturnal dyspnea  ?No speech difficulty No syncope  ? ?Current diet: in general, a "healthy" diet   ?Current exercise: yard work ? ?The ASCVD Risk score (Arnett DK, et al., 2019) failed to calculate for the following reasons: ?  The valid total  cholesterol range is 130 to 320 mg/dL ? ?---------------------------------------------------------------------------------------------------  ? ? ?Medications: ?Outpatient Medications Prior to Visit  ?Medication Sig  ? amLODipine (NORVASC) 5 MG tablet Take 1 tablet (5 mg total) by mouth daily.  ? Ascorbic Acid (VITAMIN C) 1000 MG tablet Take 1,000 mg by mouth daily.  ? aspirin 81 MG EC tablet Take by mouth.  ? Azelaic Acid 15 % FOAM Apply topically.  ? Cholecalciferol (VITAMIN D3) 50 MCG (2000 UT) TABS Take 1 tablet by mouth daily.  ? cilostazol (PLETAL) 50 MG tablet Take 1 tablet (50 mg total) by mouth 2 (two) times daily.  ? diazepam (VALIUM) 10 MG tablet Take 1 tablet by mouth daily as needed.  ? diclofenac Sodium (VOLTAREN) 1 % GEL Apply 2 g topically 4 (four) times daily.  ? EPINEPHrine 0.3 mg/0.3 mL IJ SOAJ injection as needed.   ? fluticasone (FLONASE) 50 MCG/ACT nasal spray Place 2 sprays into both nostrils daily.  ? hydrochlorothiazide (HYDRODIURIL) 12.5 MG tablet Take 1 tablet (12.5 mg total) by mouth daily.  ? HYDROcodone-acetaminophen (NORCO) 7.5-325 MG tablet 1/2-1 tablet up to four times a day as needed  ? lansoprazole (PREVACID) 30 MG capsule TAKE ONE CAPSULE BY MOUTH DAILY AT 12 NOON (Patient taking differently: TAKE ONE CAPSULE BY MOUTH DAILY AT 5 pm)  ? magnesium oxide (MAG-OX) 400 MG tablet Take 1 tablet by mouth daily.  ? meclizine (ANTIVERT) 25 MG tablet Take 1 tablet (25 mg total) by mouth 3 (three) times daily as needed for dizziness.  ? meloxicam (MOBIC) 15 MG tablet Take 1 tablet (15 mg total) by mouth daily as needed.  ? pravastatin (PRAVACHOL) 40 MG tablet Take 1 tablet (40 mg total) by mouth daily.  ? pregabalin (LYRICA) 225 MG capsule Take 1 capsule (225 mg total) by mouth 3 (three) times daily.  ? psyllium (METAMUCIL) 58.6 % powder Take 1 packet by mouth 3 (three) times daily. Large spoonful in a cup of water per day  ? selenium 200 MCG TABS tablet Take 200 mcg by mouth daily.  ?  sildenafil (VIAGRA) 100 MG tablet TAKE 0.5-1 TABLET BY MOUTH DAILY AS NEEDED FOR ERECTILE DYSFUNCTION  ? terbinafine (LAMISIL) 250 MG tablet TAKE TWO TABLETS BY MOUTH FOR SEVEN DAYS OF EACH MONTH AS INSTRUCTED  ? traMADol (ULTRAM) 50 MG tablet TAKE ONE TABLET BY MOUTH THREE TIMES A DAY AS NEEDED  ? vitamin B-12 (CYANOCOBALAMIN) 500 MCG tablet Take 500 mcg by mouth daily.  ? vitamin E 180 MG (400 UNITS) capsule Take 400 Units by mouth daily.  ? Zinc Sulfate 220 (50 Zn) MG TABS Take 1 tablet by mouth daily.  ? [DISCONTINUED] amoxicillin-clavulanate (AUGMENTIN) 875-125 MG tablet Take 1 tablet by mouth 2 (two) times daily. (Patient not taking: Reported on 06/04/2021)  ? [DISCONTINUED] ramipril (ALTACE) 10 MG capsule  (Patient not taking: Reported on 06/04/2021)  ? ?No facility-administered medications prior to visit.  ?  ?Allergies  ?Allergen Reactions  ? Ibuprofen   ?  No ibuprofen  per pt due to another drug he takes   ? ? ?Patient Care Team: ?Birdie Sons, MD as PCP - General (Family Medicine) ?Schnier, Dolores Lory, MD (Vascular Surgery) ?Edrick Kins, DPM as Consulting Physician (Podiatry) ? ?Review of Systems  ?Constitutional:  Negative for appetite change, chills, fatigue and fever.  ?HENT:  Negative for congestion, ear pain, hearing loss, nosebleeds and trouble swallowing.   ?Eyes:  Negative for pain and visual disturbance.  ?Respiratory:  Negative for cough, chest tightness and shortness of breath.   ?Cardiovascular:  Negative for chest pain, palpitations and leg swelling.  ?Gastrointestinal:  Negative for abdominal pain, blood in stool, constipation, diarrhea, nausea and vomiting.  ?Endocrine: Negative for polydipsia, polyphagia and polyuria.  ?Genitourinary:  Negative for dysuria and flank pain.  ?Musculoskeletal:  Negative for arthralgias, back pain, joint swelling, myalgias and neck stiffness.  ?Skin:  Negative for color change, rash and wound.  ?Neurological:  Negative for dizziness, tremors, seizures,  speech difficulty, weakness, light-headedness and headaches.  ?Psychiatric/Behavioral:  Negative for behavioral problems, confusion, decreased concentration, dysphoric mood and sleep disturbance. The patient is not

## 2021-06-05 LAB — LIPID PANEL
Chol/HDL Ratio: 2.9 ratio (ref 0.0–5.0)
Cholesterol, Total: 112 mg/dL (ref 100–199)
HDL: 39 mg/dL — ABNORMAL LOW (ref >39)
LDL Chol Calc (NIH): 60 mg/dL (ref 0–99)
Triglycerides: 61 mg/dL (ref 0–149)
VLDL Cholesterol Cal: 13 mg/dL (ref 5–40)

## 2021-06-05 LAB — COMPREHENSIVE METABOLIC PANEL
ALT: 16 IU/L (ref 0–44)
AST: 19 IU/L (ref 0–40)
Albumin/Globulin Ratio: 2 (ref 1.2–2.2)
Albumin: 4.5 g/dL (ref 3.8–4.8)
Alkaline Phosphatase: 124 IU/L — ABNORMAL HIGH (ref 44–121)
BUN/Creatinine Ratio: 10 (ref 10–24)
BUN: 11 mg/dL (ref 8–27)
Bilirubin Total: 0.6 mg/dL (ref 0.0–1.2)
CO2: 26 mmol/L (ref 20–29)
Calcium: 9.3 mg/dL (ref 8.6–10.2)
Chloride: 99 mmol/L (ref 96–106)
Creatinine, Ser: 1.05 mg/dL (ref 0.76–1.27)
Globulin, Total: 2.2 g/dL (ref 1.5–4.5)
Glucose: 111 mg/dL — ABNORMAL HIGH (ref 70–99)
Potassium: 4.2 mmol/L (ref 3.5–5.2)
Sodium: 138 mmol/L (ref 134–144)
Total Protein: 6.7 g/dL (ref 6.0–8.5)
eGFR: 80 mL/min/{1.73_m2} (ref >59)

## 2021-06-05 LAB — HEMOGLOBIN A1C
Est. average glucose Bld gHb Est-mCnc: 128 mg/dL
Hgb A1c MFr Bld: 6.1 % — ABNORMAL HIGH (ref 4.8–5.6)

## 2021-06-05 LAB — CBC
Hematocrit: 34.6 % — ABNORMAL LOW (ref 37.5–51.0)
Hemoglobin: 11.7 g/dL — ABNORMAL LOW (ref 13.0–17.7)
MCH: 32.6 pg (ref 26.6–33.0)
MCHC: 33.8 g/dL (ref 31.5–35.7)
MCV: 96 fL (ref 79–97)
Platelets: 162 10*3/uL (ref 150–450)
RBC: 3.59 x10E6/uL — ABNORMAL LOW (ref 4.14–5.80)
RDW: 12.9 % (ref 11.6–15.4)
WBC: 3.5 10*3/uL (ref 3.4–10.8)

## 2021-06-05 LAB — FERRITIN: Ferritin: 31 ng/mL (ref 30–400)

## 2021-06-15 DIAGNOSIS — E1143 Type 2 diabetes mellitus with diabetic autonomic (poly)neuropathy: Secondary | ICD-10-CM | POA: Insufficient documentation

## 2021-06-15 NOTE — Progress Notes (Signed)
? ? ? ?Annual Wellness Visit ? ?  ? ?Patient: Dustin Guerra, Male    DOB: [birth year], 66 y.o.   MRN: [id number] ?Visit Date: 06/04/2021 ? ?Today's Provider: Lelon Huh, MD  ? ?Chief Complaint  ?Patient presents with  ? Medicare Wellness  ? Hypertension  ? ?Subjective  ?  ?Dustin Guerra is a 66 y.o. male who presents today for his Annual Wellness Visit. ? ? ?Medications: ?Outpatient Medications Prior to Visit  ?Medication Sig  ? amLODipine (NORVASC) 5 MG tablet Take 1 tablet (5 mg total) by mouth daily.  ? Ascorbic Acid (VITAMIN C) 1000 MG tablet Take 1,000 mg by mouth daily.  ? aspirin 81 MG EC tablet Take by mouth.  ? Azelaic Acid 15 % FOAM Apply topically.  ? Cholecalciferol (VITAMIN D3) 50 MCG (2000 UT) TABS Take 1 tablet by mouth daily.  ? cilostazol (PLETAL) 50 MG tablet Take 1 tablet (50 mg total) by mouth 2 (two) times daily.  ? diazepam (VALIUM) 10 MG tablet Take 1 tablet by mouth daily as needed.  ? diclofenac Sodium (VOLTAREN) 1 % GEL Apply 2 g topically 4 (four) times daily.  ? EPINEPHrine 0.3 mg/0.3 mL IJ SOAJ injection as needed.   ? fluticasone (FLONASE) 50 MCG/ACT nasal spray Place 2 sprays into both nostrils daily.  ? hydrochlorothiazide (HYDRODIURIL) 12.5 MG tablet Take 1 tablet (12.5 mg total) by mouth daily.  ? HYDROcodone-acetaminophen (NORCO) 7.5-325 MG tablet 1/2-1 tablet up to four times a day as needed  ? lansoprazole (PREVACID) 30 MG capsule TAKE ONE CAPSULE BY MOUTH DAILY AT 12 NOON (Patient taking differently: TAKE ONE CAPSULE BY MOUTH DAILY AT 5 pm)  ? magnesium oxide (MAG-OX) 400 MG tablet Take 1 tablet by mouth daily.  ? meclizine (ANTIVERT) 25 MG tablet Take 1 tablet (25 mg total) by mouth 3 (three) times daily as needed for dizziness.  ? meloxicam (MOBIC) 15 MG tablet Take 1 tablet (15 mg total) by mouth daily as needed.  ? pravastatin (PRAVACHOL) 40 MG tablet Take 1 tablet (40 mg total) by mouth daily.  ? pregabalin (LYRICA) 225 MG capsule Take 1 capsule (225 mg  total) by mouth 3 (three) times daily.  ? psyllium (METAMUCIL) 58.6 % powder Take 1 packet by mouth 3 (three) times daily. Large spoonful in a cup of water per day  ? selenium 200 MCG TABS tablet Take 200 mcg by mouth daily.  ? sildenafil (VIAGRA) 100 MG tablet TAKE 0.5-1 TABLET BY MOUTH DAILY AS NEEDED FOR ERECTILE DYSFUNCTION  ? terbinafine (LAMISIL) 250 MG tablet TAKE TWO TABLETS BY MOUTH FOR SEVEN DAYS OF EACH MONTH AS INSTRUCTED  ? traMADol (ULTRAM) 50 MG tablet TAKE ONE TABLET BY MOUTH THREE TIMES A DAY AS NEEDED  ? vitamin B-12 (CYANOCOBALAMIN) 500 MCG tablet Take 500 mcg by mouth daily.  ? vitamin E 180 MG (400 UNITS) capsule Take 400 Units by mouth daily.  ? Zinc Sulfate 220 (50 Zn) MG TABS Take 1 tablet by mouth daily.  ? [DISCONTINUED] amoxicillin-clavulanate (AUGMENTIN) 875-125 MG tablet Take 1 tablet by mouth 2 (two) times daily. (Patient not taking: Reported on 06/04/2021)  ? [DISCONTINUED] ramipril (ALTACE) 10 MG capsule  (Patient not taking: Reported on 06/04/2021)  ? ?No facility-administered medications prior to visit.  ?  ?Allergies  ?Allergen Reactions  ? Ibuprofen   ?  No ibuprofen per pt due to another drug he takes   ? ? ?Patient Care Team: ?Birdie Sons, MD as PCP -  General (Family Medicine) ?Schnier, Dolores Lory, MD (Vascular Surgery) ?Edrick Kins, DPM as Consulting Physician (Podiatry) ? ? ? ? Objective  ?  ? ? ?Most recent functional status assessment: ? ?  06/04/2021  ? 11:00 AM  ?In your present state of health, do you have any difficulty performing the following activities:  ?Hearing? 1  ?Vision? 1  ?Difficulty concentrating or making decisions? 0  ?Walking or climbing stairs? 1  ?Dressing or bathing? 0  ?Doing errands, shopping? 0  ? ?Most recent fall risk assessment: ? ?  06/04/2021  ? 11:01 AM  ?Fall Risk   ?Falls in the past year? 0  ?Number falls in past yr: 0  ?Injury with Fall? 0  ?Risk for fall due to : No Fall Risks  ?Follow up Falls evaluation completed  ? ? Most recent  depression screenings: ? ?  06/04/2021  ? 11:00 AM 08/31/2020  ?  9:50 AM  ?PHQ 2/9 Scores  ?PHQ - 2 Score 0 0  ?PHQ- 9 Score 1 1  ? ?Most recent cognitive screening: ?   ? View : No data to display.  ?  ?  ?  ? ?Most recent Audit-C alcohol use screening ? ?  08/31/2020  ?  9:50 AM  ?Alcohol Use Disorder Test (AUDIT)  ?1. How often do you have a drink containing alcohol? 4  ?2. How many drinks containing alcohol do you have on a typical day when you are drinking? 0  ?3. How often do you have six or more drinks on one occasion? 0  ?AUDIT-C Score 4  ? ?A score of 3 or more in women, and 4 or more in men indicates increased risk for alcohol abuse, EXCEPT if all of the points are from question 1  ? ? ? Assessment & Plan  ?  ? ?Annual wellness visit done today including the all of the following: ?Reviewed patient's Family Medical History ?Reviewed and updated list of patient's medical providers ?Assessment of cognitive impairment was done ?Assessed patient's functional ability ?Established a written schedule for health screening services ?Health Risk Assessent Completed and Reviewed ? ?Exercise Activities and Dietary recommendations ? Goals   ?None ?  ? ? ?Immunization History  ?Administered Date(s) Administered  ? Tdap 08/27/2016  ? Zoster Recombinat (Shingrix) 12/09/2017, 03/26/2018  ? ? ?Health Maintenance  ?Topic Date Due  ? COVID-19 Vaccine (1) Never done  ? URINE MICROALBUMIN  11/20/2017  ? INFLUENZA VACCINE  10/15/2021  ? COLONOSCOPY (Pts 45-69yr Insurance coverage will need to be confirmed)  01/16/2025  ? TETANUS/TDAP  08/28/2026  ? Hepatitis C Screening  Completed  ? HIV Screening  Completed  ? Zoster Vaccines- Shingrix  Completed  ? HPV VACCINES  Aged Out  ? ? ? ?Discussed health benefits of physical activity, and encouraged him to engage in regular exercise appropriate for his age and condition.  ? ? ? ?  ?  ? ?The entirety of the information documented in the History of Present Illness, Review of Systems and  Physical Exam were personally obtained by me. Portions of this information were initially documented by the CMA and reviewed by me for thoroughness and accuracy.   ? ? ?DLelon Huh MD  ?[facility]?[phone](phone) ?[phone](fax) ? ?[facility]   ?

## 2021-06-21 ENCOUNTER — Other Ambulatory Visit: Payer: Self-pay | Admitting: Family Medicine

## 2021-06-21 DIAGNOSIS — G629 Polyneuropathy, unspecified: Secondary | ICD-10-CM

## 2021-06-21 MED ORDER — LANSOPRAZOLE 30 MG PO CPDR: DELAYED_RELEASE_CAPSULE | ORAL | 4 refills | Status: DC

## 2021-06-21 NOTE — Telephone Encounter (Signed)
Medication: HYDROcodone-acetaminophen (NORCO) 7.5-325 MG tablet [[id number]]  ? ?Has the patient contacted their pharmacy? YES ?(Agent: If no, request that the patient contact the pharmacy for the refill. If patient does not wish to contact the pharmacy document the reason why and proceed with request.) ?(Agent: If yes, when and what did the pharmacy advise?) ? ?Preferred Pharmacy (with phone number or street name): [facility] [id number], [place] ?[place] [address] ?Phone: [phone] Fax: [phone] ?Hours: Not open 24 hours ? ? ?Has the patient been seen for an appointment in the last year OR does the patient have an upcoming appointment? YES 06/04/21 ? ?Agent: Please be advised that RX refills may take up to 3 business days. We ask that you follow-up with your pharmacy. ?

## 2021-06-21 NOTE — Telephone Encounter (Signed)
Requested Prescriptions  ?Pending Prescriptions Disp Refills  ?? lansoprazole (PREVACID) 30 MG capsule 90 capsule 4  ?  Sig: TAKE ONE CAPSULE BY MOUTH DAILY AT 12 NOON  ?  ? Gastroenterology: Proton Pump Inhibitors 2 Passed - 06/21/2021  3:53 PM  ?  ?  Passed - ALT in normal range and within 360 days  ?  ALT  ?Date Value Ref Range Status  ?06/04/2021 16 0 - 44 IU/L Final  ? ?SGPT (ALT)  ?Date Value Ref Range Status  ?06/03/2012 34 12 - 78 U/L Final  ?   ?  ?  Passed - AST in normal range and within 360 days  ?  AST  ?Date Value Ref Range Status  ?06/04/2021 19 0 - 40 IU/L Final  ? ?SGOT(AST)  ?Date Value Ref Range Status  ?06/03/2012 48 (H) 15 - 37 Unit/L Final  ?   ?  ?  Passed - Valid encounter within last 12 months  ?  Recent Outpatient Visits   ?      ? 2 weeks ago Annual physical exam  ? [facility] Birdie Sons, MD  ? 1 month ago Acute frontal sinusitis, recurrence not specified  ? [facility] Birdie Sons, MD  ? 3 months ago Essential hypertension  ? [facility] Birdie Sons, MD  ? 6 months ago Essential hypertension  ? [facility] Birdie Sons, MD  ? 9 months ago Pre-diabetes  ? [facility] Caryn Section, Kirstie Peri, MD  ?  ?  ?Future Appointments   ?        ? In 5 months Fisher, Kirstie Peri, MD [facility]  ?  ? ?  ?  ?  ? ? ?

## 2021-06-21 NOTE — Telephone Encounter (Signed)
Requested medication (s) are due for refill today: no ? ?Requested medication (s) are on the active medication list: yes ? ?Last refill:  05/25/21 #120/0 ? ?Future visit scheduled: yes ? ?Notes to clinic:  Unable to refill per protocol, cannot delegate. ? ? ?  ?Requested Prescriptions  ?Pending Prescriptions Disp Refills  ? HYDROcodone-acetaminophen (NORCO) 7.5-325 MG tablet 120 tablet 0  ?  Sig: 1/2-1 tablet up to four times a day as needed  ?  ? Not Delegated - Analgesics:  Opioid Agonist Combinations Failed - 06/21/2021  1:47 PM  ?  ?  Failed - This refill cannot be delegated  ?  ?  Failed - Urine Drug Screen completed in last 360 days  ?  ?  Passed - Valid encounter within last 3 months  ?  Recent Outpatient Visits   ? ?      ? 2 weeks ago Annual physical exam  ? [facility] Birdie Sons, MD  ? 1 month ago Acute frontal sinusitis, recurrence not specified  ? [facility] Birdie Sons, MD  ? 3 months ago Essential hypertension  ? [facility] Birdie Sons, MD  ? 6 months ago Essential hypertension  ? [facility] Birdie Sons, MD  ? 9 months ago Pre-diabetes  ? [facility] Caryn Section, Kirstie Peri, MD  ? ?  ?  ?Future Appointments   ? ?        ? In 5 months Fisher, Kirstie Peri, MD [facility]  ? ?  ? ?  ?  ?  ? ?

## 2021-06-21 NOTE — Telephone Encounter (Signed)
Copied from [place] [id number]. Topic: Quick Communication - Rx Refill/Question ?>> Jun 21, 2021  9:48 AM Leward Quan A wrote: ?Medication: lansoprazole (PREVACID) 30 MG capsule  ? ?Has the patient contacted their pharmacy? No. Need Rx transferred ?(Agent: If no, request that the patient contact the pharmacy for the refill. If patient does not wish to contact the pharmacy document the reason why and proceed with request.) ?(Agent: If yes, when and what did the pharmacy advise?) ? ?Preferred Pharmacy (with phone number or street name): [facility] [id number], [place]  ?Phone:  [phone] ?Fax:  [phone] ? ? ? ?Has the patient been seen for an appointment in the last year OR does the patient have an upcoming appointment? Yes.   ? ?Agent: Please be advised that RX refills may take up to 3 business days. We ask that you follow-up with your pharmacy. ?

## 2021-06-22 MED ORDER — HYDROCODONE-ACETAMINOPHEN 7.5-325 MG PO TABS: ORAL_TABLET | ORAL | 0 refills | Status: DC

## 2021-07-26 ENCOUNTER — Other Ambulatory Visit: Payer: Self-pay | Admitting: Family Medicine

## 2021-07-26 DIAGNOSIS — G629 Polyneuropathy, unspecified: Secondary | ICD-10-CM

## 2021-07-26 NOTE — Telephone Encounter (Signed)
Copied from [place] [id number]. Topic: General - Other ?>> Jul 26, 2021  9:19 AM Tessa Lerner A wrote: ?Reason for CRM: Medication Refill - Medication: HYDROcodone-acetaminophen (NORCO) 7.5-325 MG tablet [[id number]]  ? ?Has the patient contacted their pharmacy? No. ?(Agent: If no, request that the patient contact the pharmacy for the refill. If patient does not wish to contact the pharmacy document the reason why and proceed with request.) ?(Agent: If yes, when and what did the pharmacy advise?) ? ?Preferred Pharmacy (with phone number or street name): [facility] [id number], [place] ?[place] [address] ?Phone: [phone] Fax: [phone] ?Hours: Not open 24 hours ? ? ?Has the patient been seen for an appointment in the last year OR does the patient have an upcoming appointment? Yes.   ? ?Agent: Please be advised that RX refills may take up to 3 business days. We ask that you follow-up with your pharmacy. ?

## 2021-07-29 MED ORDER — HYDROCODONE-ACETAMINOPHEN 7.5-325 MG PO TABS: ORAL_TABLET | ORAL | 0 refills | Status: DC

## 2021-07-29 NOTE — Telephone Encounter (Signed)
Requested medication (s) are due for refill today: yes ? ?Requested medication (s) are on the active medication list: yes ? ?Last refill:  07/02/21 #120 ? ?Future visit scheduled: yes ? ?Notes to clinic:  med not delegated to NT to RF ? ? ?Requested Prescriptions  ?Pending Prescriptions Disp Refills  ? HYDROcodone-acetaminophen (NORCO) 7.5-325 MG tablet 120 tablet 0  ?  Sig: 1/2-1 tablet up to four times a day as needed  ?  ? Not Delegated - Analgesics:  Opioid Agonist Combinations Failed - 07/26/2021 10:19 AM  ?  ?  Failed - This refill cannot be delegated  ?  ?  Failed - Urine Drug Screen completed in last 360 days  ?  ?  Passed - Valid encounter within last 3 months  ?  Recent Outpatient Visits   ? ?      ? 1 month ago Annual physical exam  ? [facility] Birdie Sons, MD  ? 3 months ago Acute frontal sinusitis, recurrence not specified  ? [facility] Birdie Sons, MD  ? 4 months ago Essential hypertension  ? [facility] Birdie Sons, MD  ? 7 months ago Essential hypertension  ? [facility] Birdie Sons, MD  ? 11 months ago Pre-diabetes  ? [facility] Caryn Section, Kirstie Peri, MD  ? ?  ?  ?Future Appointments   ? ?        ? In 4 months Fisher, Kirstie Peri, MD [facility]  ? ?  ? ? ?  ?  ?  ? ? ? ? ?

## 2021-08-26 ENCOUNTER — Other Ambulatory Visit: Payer: Self-pay | Admitting: Family Medicine

## 2021-08-26 DIAGNOSIS — I10 Essential (primary) hypertension: Secondary | ICD-10-CM

## 2021-08-26 DIAGNOSIS — G629 Polyneuropathy, unspecified: Secondary | ICD-10-CM

## 2021-08-26 NOTE — Telephone Encounter (Signed)
Medication Refill - Medication: HYDROcodone-acetaminophen (NORCO) 7.5-325 MG tablet [[id number]]   Has the patient contacted their pharmacy? Yes.     Preferred Pharmacy (with phone number or street name):  [facility] [id number], [place]  [place] [address]  Phone: [phone] Fax: [phone]  Hours: Not open 24 hours     Has the patient been seen for an appointment in the last year OR does the patient have an upcoming appointment? Yes.    Agent: Please be advised that RX refills may take up to 3 business days. We ask that you follow-up with your pharmacy.

## 2021-08-27 MED ORDER — HYDROCODONE-ACETAMINOPHEN 7.5-325 MG PO TABS: ORAL_TABLET | ORAL | 0 refills | Status: DC

## 2021-08-27 NOTE — Telephone Encounter (Signed)
Requested medication (s) are due for refill today: yes  Requested medication (s) are on the active medication list: yes  Last refill:  07/29/21 #120   Future visit scheduled: yes  Notes to clinic:  med not delegated to NT to RF   Requested Prescriptions  Pending Prescriptions Disp Refills   HYDROcodone-acetaminophen ([place]) 7.5-325 MG tablet 120 tablet 0    Sig: 1/2-1 tablet up to four times a day as needed     Not Delegated - Analgesics:  Opioid Agonist Combinations Failed - 08/26/2021  2:39 PM      Failed - This refill cannot be delegated      Failed - Urine Drug Screen completed in last 360 days      Failed - Valid encounter within last 3 months    Recent Outpatient Visits           2 months ago Annual physical exam   [facility] Birdie Sons, MD   4 months ago Acute frontal sinusitis, recurrence not specified   [facility] Birdie Sons, MD   5 months ago Essential hypertension   [facility] Birdie Sons, MD   8 months ago Essential hypertension   [facility] Birdie Sons, MD   12 months ago [place], Kirstie Peri, MD       Future Appointments             In 3 months Fisher, Kirstie Peri, MD [facility] [place]

## 2021-09-14 ENCOUNTER — Other Ambulatory Visit: Payer: Self-pay | Admitting: Family Medicine

## 2021-09-14 DIAGNOSIS — I10 Essential (primary) hypertension: Secondary | ICD-10-CM

## 2021-09-16 NOTE — Telephone Encounter (Signed)
Requested Prescriptions  Pending Prescriptions Disp Refills  . amLODipine (NORVASC) 5 MG tablet [Pharmacy Med Name: amLODIPine BESYLATE 5 MG TAB] 90 tablet 1    Sig: TAKE ONE TABLET BY MOUTH DAILY     Cardiovascular: Calcium Channel Blockers 2 Passed - 09/14/2021  8:51 AM      Passed - Last BP in normal range    BP Readings from Last 1 Encounters:  06/04/21 138/77         Passed - Last Heart Rate in normal range    Pulse Readings from Last 1 Encounters:  06/04/21 63         Passed - Valid encounter within last 6 months    Recent Outpatient Visits          3 months ago Annual physical exam   [facility] Birdie Sons, MD   4 months ago Acute frontal sinusitis, recurrence not specified   [facility] Birdie Sons, MD   6 months ago Essential hypertension   [facility] Birdie Sons, MD   9 months ago Essential hypertension   [facility] Birdie Sons, MD   1 year ago Pre-diabetes   [place], Kirstie Peri, MD      Future Appointments            In 2 months Fisher, Kirstie Peri, MD [facility], [place]

## 2021-09-26 ENCOUNTER — Other Ambulatory Visit: Payer: Self-pay | Admitting: Family Medicine

## 2021-09-26 DIAGNOSIS — G629 Polyneuropathy, unspecified: Secondary | ICD-10-CM

## 2021-09-26 MED ORDER — HYDROCODONE-ACETAMINOPHEN 7.5-325 MG PO TABS: ORAL_TABLET | ORAL | 0 refills | Status: DC

## 2021-09-26 NOTE — Telephone Encounter (Signed)
Requested medication (s) are due for refill today - yes  Requested medication (s) are on the active medication list -yes  Future visit scheduled -yes  Last refill: 08/27/21 #120  Notes to clinic: non delegated Rx  Requested Prescriptions  Pending Prescriptions Disp Refills   HYDROcodone-acetaminophen ([place]) 7.5-325 MG tablet 120 tablet 0    Sig: 1/2-1 tablet up to four times a day as needed     Not Delegated - Analgesics:  Opioid Agonist Combinations Failed - 09/26/2021 10:01 AM      Failed - This refill cannot be delegated      Failed - Urine Drug Screen completed in last 360 days      Failed - Valid encounter within last 3 months    Recent Outpatient Visits           3 months ago Annual physical exam   [facility] Birdie Sons, MD   5 months ago Acute frontal sinusitis, recurrence not specified   [facility] Birdie Sons, MD   6 months ago Essential hypertension   [facility] Birdie Sons, MD   9 months ago Essential hypertension   [place], Kirstie Peri, MD   1 year ago [place], Donald E, MD       Future Appointments             In 2 months Fisher, Kirstie Peri, MD [facility], [place]               Requested Prescriptions  Pending Prescriptions Disp Refills   HYDROcodone-acetaminophen ([place]) 7.5-325 MG tablet 120 tablet 0    Sig: 1/2-1 tablet up to four times a day as needed     Not Delegated - Analgesics:  Opioid Agonist Combinations Failed - 09/26/2021 10:01 AM      Failed - This refill cannot be delegated      Failed - Urine Drug Screen completed in last 360 days      Failed - Valid encounter within last 3 months    Recent Outpatient Visits           3 months ago Annual physical exam   [facility] Birdie Sons, MD   5 months ago Acute frontal sinusitis, recurrence not specified   [facility] Birdie Sons, MD   6 months ago Essential hypertension   [facility] Birdie Sons, MD   9 months ago Essential hypertension   [facility] Birdie Sons, MD   1 year ago Pre-diabetes   [place], Kirstie Peri, MD       Future Appointments             In 2 months Fisher, Kirstie Peri, MD [facility], [place]

## 2021-09-26 NOTE — Telephone Encounter (Signed)
Copied from [place] [id number]. Topic: General - Other >> Sep 26, 2021  9:57 AM Everette C wrote: Reason for CRM: Medication Refill - Medication: HYDROcodone-acetaminophen (NORCO) 7.5-325 MG tablet [[id number]]   Has the patient contacted their pharmacy? No. (Agent: If no, request that the patient contact the pharmacy for the refill. If patient does not wish to contact the pharmacy document the reason why and proceed with request.) (Agent: If yes, when and what did the pharmacy advise?)  Preferred Pharmacy (with phone number or street name): [facility] [id number], [place] [address] Phone: [phone] Fax: [phone] Hours: Not open 24 hours  Has the patient been seen for an appointment in the last year OR does the patient have an upcoming appointment? Yes.    Agent: Please be advised that RX refills may take up to 3 business days. We ask that you follow-up with your pharmacy.

## 2021-10-29 ENCOUNTER — Other Ambulatory Visit: Payer: Self-pay | Admitting: Family Medicine

## 2021-10-29 DIAGNOSIS — G629 Polyneuropathy, unspecified: Secondary | ICD-10-CM

## 2021-10-29 NOTE — Telephone Encounter (Signed)
Requested medication (s) are due for refill today: yes  Requested medication (s) are on the active medication list: yes    Last refill: 09/26/21  #120  0 refills  Future visit scheduled Yes 12/13/21  Notes to clinic:Not delegated, please review. Thank you.  Requested Prescriptions  Pending Prescriptions Disp Refills   HYDROcodone-acetaminophen (NORCO) 7.5-325 MG tablet 120 tablet 0    Sig: 1/2-1 tablet up to four times a day as needed     Not Delegated - Analgesics:  Opioid Agonist Combinations Failed - 10/29/2021 12:23 PM      Failed - This refill cannot be delegated      Failed - Urine Drug Screen completed in last 360 days      Failed - Valid encounter within last 3 months    Recent Outpatient Visits           4 months ago Annual physical exam   [facility] Birdie Sons, MD   6 months ago Acute frontal sinusitis, recurrence not specified   [facility] Birdie Sons, MD   7 months ago Essential hypertension   [facility] Birdie Sons, MD   10 months ago Essential hypertension   [facility] Birdie Sons, MD   1 year ago [place], Kirstie Peri, MD       Future Appointments             In 1 month Fisher, Kirstie Peri, MD [facility], [place]

## 2021-10-29 NOTE — Telephone Encounter (Unsigned)
Copied from [place] [id number]. Topic: General - Other >> Oct 29, 2021  8:41 AM Everette C wrote: Reason for CRM: Medication Refill - Medication: HYDROcodone-acetaminophen (NORCO) 7.5-325 MG tablet [[id number]]   Has the patient contacted their pharmacy? No.   (Agent: If no, request that the patient contact the pharmacy for the refill. If patient does not wish to contact the pharmacy document the reason why and proceed with request.) (Agent: If yes, when and what did the pharmacy advise?)  Preferred Pharmacy (with phone number or street name): [facility] [id number], [place] [address] Phone: [phone] Fax: [phone] Hours: Not open 24 hours   Has the patient been seen for an appointment in the last year OR does the patient have an upcoming appointment? Yes.    Agent: Please be advised that RX refills may take up to 3 business days. We ask that you follow-up with your pharmacy.

## 2021-10-30 MED ORDER — HYDROCODONE-ACETAMINOPHEN 7.5-325 MG PO TABS: ORAL_TABLET | ORAL | 0 refills | Status: DC

## 2021-11-11 ENCOUNTER — Ambulatory Visit (INDEPENDENT_AMBULATORY_CARE_PROVIDER_SITE_OTHER): Payer: Medicare HMO | Admitting: Podiatry

## 2021-11-11 DIAGNOSIS — G629 Polyneuropathy, unspecified: Secondary | ICD-10-CM

## 2021-11-11 DIAGNOSIS — L03039 Cellulitis of unspecified toe: Secondary | ICD-10-CM

## 2021-11-11 DIAGNOSIS — L03032 Cellulitis of left toe: Secondary | ICD-10-CM | POA: Diagnosis not present

## 2021-11-11 NOTE — Progress Notes (Signed)
   Chief Complaint  Patient presents with   Nail Problem    L great toe, blisters on nail, severe nueropathy, diabetic, patient states that his toenails are dying and the blisters started on this past Thursday.    HPI: 66 y.o. male presenting today for evaluation of draining and a blister to the left hallux nail plate.  Patient is diabetic with severe neuropathy.  He was concerned because he was diabetic and he presents to have it evaluated.  He has no feeling in his feet but he denies a history of injury.  Past Medical History:  Diagnosis Date   Anxiety    Arthritis    Charcot's joint    left ankle   Diabetes mellitus without complication (HCC)    GERD (gastroesophageal reflux disease)    Hypertension    Neuropathy    bilateral feet per pt   Plantar fasciitis 08/04/2015   PONV (postoperative nausea and vomiting)     Past Surgical History:  Procedure Laterality Date   BACK SURGERY     COLON SURGERY  2015   Colon resection due to diverticulosis   VEIN LIGATION AND STRIPPING     VEIN SURGERY     Vein stripping at age 49-25   VENTRAL HERNIA REPAIR N/A 11/22/2020   Procedure: OPEN VENTRAL [place];  Surgeon: [name], MD;  Location: [facility];  Service: General;  Laterality: N/A;    Allergies  Allergen Reactions   Ibuprofen     No ibuprofen per pt due to another drug he takes      Physical Exam: General: The patient is alert and oriented x3 in no acute distress.  Dermatology: Loosely adhered left hallux nail plate with underlying drainage noted.  Vascular: Palpable pedal pulses bilaterally. Capillary refill within normal limits.  Negative for any significant edema or erythema  Neurological: Light touch and protective threshold diminished  Musculoskeletal Exam: No pedal deformities noted  Assessment: 1.  Loosely adhered dystrophic nail left hallux nail plate 2.  Severe peripheral polyneuropathy secondary to diabetes   Plan of Care:   1. Patient evaluated.  2.  After evaluation of the left hallux nail plate decision was made to remove the nail in its entirety.  Patient is neuropathic and has no sensation so no local anesthesia was utilized.  The left hallux nail plate was avulsed in its entirety and a light dressing applied.  Post care instructions provided. 3.  Today we are going to initiate authorization to see if the patient is approved for [employer] application for the peripheral neuropathy this may provide significant benefit.  He has failed oral medication and continues to have severe pins-and-needles and burning sensation to the feet 4.  Return to clinic in 2 weeks for follow-up left hallux nail avulsion     Edrick Kins, DPM [facility]  Dr. Edrick Kins, DPM    [address], [place] [address]                Office [phone]  Fax [phone]

## 2021-11-25 ENCOUNTER — Telehealth: Payer: Self-pay | Admitting: *Deleted

## 2021-11-25 NOTE — Telephone Encounter (Signed)
Patient is asking for a sooner appointment, have developed blisters on his toes,very concerned.

## 2021-11-28 ENCOUNTER — Other Ambulatory Visit: Payer: Self-pay | Admitting: Family Medicine

## 2021-11-28 DIAGNOSIS — G629 Polyneuropathy, unspecified: Secondary | ICD-10-CM

## 2021-11-28 NOTE — Telephone Encounter (Unsigned)
Copied from [place] [id number]. Topic: General - Other >> Nov 28, 2021  1:55 PM Everette C wrote: Reason for CRM: Medication Refill - Medication: HYDROcodone-acetaminophen ([place]) 7.5-325 MG tablet [[id number]]   Has the patient contacted their pharmacy? No. (Agent: If no, request that the patient contact the pharmacy for the refill. If patient does not wish to contact the pharmacy document the reason why and proceed with request.) (Agent: If yes, when and what did the pharmacy advise?)  Preferred Pharmacy (with phone number or street name): [facility] [id number], [place] [address] Phone: [phone] Fax: [phone] Hours: Not open 24 hours   Has the patient been seen for an appointment in the last year OR does the patient have an upcoming appointment? Yes.    Agent: Please be advised that RX refills may take up to 3 business days. We ask that you follow-up with your pharmacy.

## 2021-11-29 ENCOUNTER — Ambulatory Visit (INDEPENDENT_AMBULATORY_CARE_PROVIDER_SITE_OTHER): Payer: Medicare HMO | Admitting: Podiatry

## 2021-11-29 DIAGNOSIS — L97522 Non-pressure chronic ulcer of other part of left foot with fat layer exposed: Secondary | ICD-10-CM | POA: Diagnosis not present

## 2021-11-29 MED ORDER — HYDROCODONE-ACETAMINOPHEN 7.5-325 MG PO TABS: ORAL_TABLET | ORAL | 0 refills | Status: DC

## 2021-11-29 MED ORDER — SILVER SULFADIAZINE 1 % EX CREA: 1.0000 | TOPICAL_CREAM | Freq: Every day | CUTANEOUS | 1 refills | Status: AC

## 2021-11-29 NOTE — Progress Notes (Signed)
   Chief Complaint  Patient presents with   Follow-up    Patient is here for left foot great toe nail check, patient states that he has a blister on left foot 3rd toe that he noticed on last week Tuesday.    HPI: 66 y.o. male presenting today for new complaint of an ulcer that developed to the distal portion of the left foot overlying the third toe.  Patient states that last week he wore a pair of tennis shoes that he had not worn for a long time and he did not wear socks.  He noticed a blister developing and presents for further treatment and evaluation  Past Medical History:  Diagnosis Date   Anxiety    Arthritis    Charcot's joint    left ankle   Diabetes mellitus without complication (HCC)    GERD (gastroesophageal reflux disease)    Hypertension    Neuropathy    bilateral feet per pt   Plantar fasciitis 08/04/2015   PONV (postoperative nausea and vomiting)     Past Surgical History:  Procedure Laterality Date   BACK SURGERY     COLON SURGERY  2015   Colon resection due to diverticulosis   VEIN LIGATION AND STRIPPING     VEIN SURGERY     Vein stripping at age 52-25   VENTRAL HERNIA REPAIR N/A 11/22/2020   Procedure: OPEN VENTRAL [place];  Surgeon: [name], MD;  Location: [facility];  Service: General;  Laterality: N/A;    Allergies  Allergen Reactions   Ibuprofen     No ibuprofen per pt due to another drug he takes      Physical Exam: General: The patient is alert and oriented x3 in no acute distress.  Dermatology: Ulcer noted along the proximal portion of the third toe left foot.  Please see above noted photo.  Minimal serous drainage.  There is no exposed bone muscle tendon ligament or joint.  No malodor.  Clinically no indication of infection.  All other previous wounds have healed  Vascular: Palpable pedal pulses bilaterally. Capillary refill within normal limits.  Negative for any significant edema or erythema  Neurological:  Light touch and protective threshold grossly intact  Musculoskeletal Exam: No pedal deformities noted.  No prior amputations   Assessment: 1.  Ulcer third toe left foot   Plan of Care:  1. Patient evaluated.  2.  The ulcer appears very superficial and stable. 3.  Prescription for Silvadene cream sent to the pharmacy apply 2 times daily with a light dressing 4.  Resume postsurgical shoe or shoes that do not constrict or irritate the toebox area 5.  Return to clinic in 3 weeks     Edrick Kins, DPM [facility]  Dr. Edrick Kins, DPM    [address],  [address]                Office [phone]  Fax [phone]

## 2021-11-29 NOTE — Telephone Encounter (Signed)
Requested medication (s) are due for refill today:   Provider to review  Requested medication (s) are on the active medication list:   Yes  Future visit scheduled:   Yes   Last ordered: 10/30/2021 #120, 0 refills  Non delegated refill    Requested Prescriptions  Pending Prescriptions Disp Refills   HYDROcodone-acetaminophen ([place]) 7.5-325 MG tablet 120 tablet 0    Sig: 1/2-1 tablet up to four times a day as needed     Not Delegated - Analgesics:  Opioid Agonist Combinations Failed - 11/28/2021  2:08 PM      Failed - This refill cannot be delegated      Failed - Urine Drug Screen completed in last 360 days      Failed - Valid encounter within last 3 months    Recent Outpatient Visits           5 months ago Annual physical exam   [facility] Birdie Sons, MD   7 months ago Acute frontal sinusitis, recurrence not specified   [facility] Birdie Sons, MD   8 months ago Essential hypertension   [facility] Birdie Sons, MD   11 months ago Essential hypertension   [facility] Birdie Sons, MD   1 year ago [place], Kirstie Peri, MD       Future Appointments             In 2 weeks Fisher, Kirstie Peri, MD [facility], [place]

## 2021-12-12 NOTE — Progress Notes (Signed)
I,Roshena L Chambers,acting as a scribe for Lelon Huh, MD.,have documented all relevant documentation on the behalf of Lelon Huh, MD,as directed by  Lelon Huh, MD while in the presence of Lelon Huh, MD.   Established patient visit   Patient: Dustin Guerra   DOB: [birth year]   66 y.o. Male  MRN: [id number] Visit Date: 12/13/2021  Today's healthcare provider: Lelon Huh, MD   Chief Complaint  Patient presents with   Hypertension   Prediabetes   Anemia   Subjective    HPI  Hypertension, follow-up  BP Readings from Last 3 Encounters:  12/13/21 (!) 148/78  06/04/21 138/77  04/29/21 (!) 142/86   Wt Readings from Last 3 Encounters:  12/13/21 245 lb (111.1 kg)  06/04/21 246 lb (111.6 kg)  04/29/21 249 lb (112.9 kg)     He was last seen for hypertension 6 months ago.  BP at that visit was 138/77. Management since that visit includes continue same medication.  He reports good compliance with treatment. He is not having side effects.  He is following a Regular diet. He is exercising. He does not smoke.  Use of agents associated with hypertension: NSAIDS.   Outside blood pressures are not checked. Symptoms: No chest pain No chest pressure  No palpitations No syncope  No dyspnea No orthopnea  No paroxysmal nocturnal dyspnea No lower extremity edema   Pertinent labs Lab Results  Component Value Date   CHOL 112 06/04/2021   HDL 39 (L) 06/04/2021   LDLCALC 60 06/04/2021   TRIG 61 06/04/2021   CHOLHDL 2.9 06/04/2021   Lab Results  Component Value Date   NA 138 06/04/2021   K 4.2 06/04/2021   CREATININE 1.05 06/04/2021   EGFR 80 06/04/2021   GLUCOSE 111 (H) 06/04/2021   TSH 4.170 09/05/2019     The ASCVD Risk score (Arnett DK, et al., 2019) failed to calculate for the following reasons:   The valid total cholesterol range is 130 to 320  mg/dL  ---------------------------------------------------------------------------------------------------   Prediabetes, Follow-up  Lab Results  Component Value Date   HGBA1C 6.4 (A) 12/13/2021   HGBA1C 6.1 (H) 06/04/2021   HGBA1C 6.2 (H) 11/12/2020   Wt Readings from Last 3 Encounters:  12/13/21 245 lb (111.1 kg)  06/04/21 246 lb (111.6 kg)  04/29/21 249 lb (112.9 kg)   Last seen for this problem 6 months ago.  Management since then includes continue same medication. He reports good compliance with treatment. He is not having side effects.  Symptoms: No fatigue No foot ulcerations  No appetite changes No nausea  No paresthesia of the feet  No polydipsia  No polyuria No visual disturbances   No vomiting     Home blood sugar records:  blood sugars are not checked  Episodes of hypoglycemia? No    Most Recent Eye Exam: >1 year ago Current exercise: walking Current diet habits: well balanced  ---------------------------------------------------------------------------------------------------   Follow up for anemia:  The patient was last seen for this 6 months ago. Changes made at last visit include advising patient to take OTC iron sulfate 336m every day.  He reports poor compliance with treatment. Patient did not start taking iron supplement due to it causing constipation in the past. He feels that condition is Unchanged. He had also been having some intermittent lower abdominal pain and referred to GI for evaluation, however he states he was called for [employer] had to cancel his appointment.    -----------------------------------------------------------------------------------------  He also reports recent episode of blood in semen. No pain with intercourse or ejaculation. However he states there have been small dark spots present on several occasions over the course of several months.   Medications: Outpatient Medications Prior to Visit  Medication Sig    amLODipine (NORVASC) 5 MG tablet TAKE ONE TABLET BY MOUTH DAILY   Ascorbic Acid (VITAMIN C) 1000 MG tablet Take 1,000 mg by mouth daily.   Azelaic Acid 15 % FOAM Apply topically.   Cholecalciferol (VITAMIN D3) 50 MCG (2000 UT) TABS Take 1 tablet by mouth daily.   cilostazol (PLETAL) 50 MG tablet Take 1 tablet (50 mg total) by mouth 2 (two) times daily.   diazepam (VALIUM) 10 MG tablet TAKE ONE TABLET BY MOUTH DAILY AS NEEDED FOR ANXIETY   diclofenac Sodium (VOLTAREN) 1 % GEL Apply 2 g topically 4 (four) times daily.   EPINEPHrine 0.3 mg/0.3 mL IJ SOAJ injection as needed.    hydrochlorothiazide (HYDRODIURIL) 12.5 MG tablet TAKE ONE TABLET BY MOUTH DAILY   HYDROcodone-acetaminophen (NORCO) 7.5-325 MG tablet 1/2-1 tablet up to four times a day as needed   lansoprazole (PREVACID) 30 MG capsule TAKE ONE CAPSULE BY MOUTH DAILY AT 12 NOON   magnesium oxide (MAG-OX) 400 MG tablet Take 1 tablet by mouth daily.   meclizine (ANTIVERT) 25 MG tablet Take 1 tablet (25 mg total) by mouth 3 (three) times daily as needed for dizziness.   meloxicam (MOBIC) 15 MG tablet Take 1 tablet (15 mg total) by mouth daily as needed.   pravastatin (PRAVACHOL) 40 MG tablet Take 1 tablet (40 mg total) by mouth daily.   pregabalin (LYRICA) 225 MG capsule Take 1 capsule (225 mg total) by mouth 3 (three) times daily.   psyllium (METAMUCIL) 58.6 % powder Take 1 packet by mouth 3 (three) times daily. Large spoonful in a cup of water per day   selenium 200 MCG TABS tablet Take 200 mcg by mouth daily.   sildenafil (VIAGRA) 100 MG tablet TAKE 0.5-1 TABLET BY MOUTH DAILY AS NEEDED FOR ERECTILE DYSFUNCTION   silver sulfADIAZINE (SILVADENE) 1 % cream Apply 1 Application topically daily.   traMADol (ULTRAM) 50 MG tablet TAKE ONE TABLET BY MOUTH THREE TIMES A DAY AS NEEDED   vitamin B-12 (CYANOCOBALAMIN) 500 MCG tablet Take 500 mcg by mouth daily.   vitamin E 180 MG (400 UNITS) capsule Take 400 Units by mouth daily.   Zinc Sulfate 220  (50 Zn) MG TABS Take 1 tablet by mouth daily.   aspirin 81 MG EC tablet Take by mouth.   terbinafine (LAMISIL) 250 MG tablet TAKE TWO TABLETS BY MOUTH FOR SEVEN DAYS OF EACH MONTH AS INSTRUCTED   [DISCONTINUED] fluticasone (FLONASE) 50 MCG/ACT nasal spray Place 2 sprays into both nostrils daily.   No facility-administered medications prior to visit.    Review of Systems  Constitutional:  Negative for appetite change, chills and fever.  Respiratory:  Negative for chest tightness, shortness of breath and wheezing.   Cardiovascular:  Negative for chest pain and palpitations.  Gastrointestinal:  Negative for abdominal pain, nausea and vomiting.       Objective    BP (!) 148/78 (BP Location: Right Arm, Patient Position: Sitting, Cuff Size: Large)   Pulse 84   Temp 97.9 F (36.6 C) (Oral)   Resp 14   Wt 245 lb (111.1 kg)   SpO2 96% Comment: room air  BMI 31.46 kg/m    Physical Exam   General: Appearance:  Mildly obese male in no acute distress  Eyes:    PERRL, conjunctiva/corneas clear, EOM's intact       Lungs:     Clear to auscultation bilaterally, respirations unlabored  Heart:    Normal heart rate. Normal rhythm. No murmurs, rubs, or gallops.    MS:   All extremities are intact.    Neurologic:   Awake, alert, oriented x 3. No apparent focal neurological defect.         Results for orders placed or performed in visit on 12/13/21  POCT HgB A1C  Result Value Ref Range   Hemoglobin A1C 6.4 (A) 4.0 - 5.6 %   Est. average glucose Bld gHb Est-mCnc 137     Assessment & Plan     1. Pre-diabetes A1c up a bit. Is to work on diet improvements and controlling weight  2. Left lower quadrant pain   3. Other iron deficiency anemia Was previously referred to GI but had to cancel due to being called for jury duty.  Referral resent  - Iron, TIBC and Ferritin Panel - CBC  4. Hematospermia One episode of frank blood, but several episodes of small black specks that were likely  small blood clots.   - Urine cytology ancillary only - Urinalysis, Routine w reflex microscopic  5. Prostate cancer screening  - PSA Total (Reflex To Free) ([facility] only)  6. Essential hypertension Fairly well controlled. Continue current medications.    He declined flu vaccine today.       The entirety of the information documented in the History of Present Illness, Review of Systems and Physical Exam were personally obtained by me. Portions of this information were initially documented by the CMA and reviewed by me for thoroughness and accuracy.      Lelon Huh, MD  [facility] [phone] (phone) [phone] (fax)  [place]

## 2021-12-13 ENCOUNTER — Other Ambulatory Visit (HOSPITAL_COMMUNITY)
Admission: RE | Admit: 2021-12-13 | Discharge: 2021-12-13 | Disposition: A | Discharge status: Home / Self Care | Payer: Medicare HMO | Source: Ambulatory Visit | Attending: Family Medicine | Admitting: Family Medicine

## 2021-12-13 ENCOUNTER — Encounter: Payer: Self-pay | Admitting: Family Medicine

## 2021-12-13 ENCOUNTER — Ambulatory Visit (INDEPENDENT_AMBULATORY_CARE_PROVIDER_SITE_OTHER): Payer: Medicare HMO | Admitting: Family Medicine

## 2021-12-13 VITALS — BP 148/78 | HR 84 | Temp 97.9°F | Resp 14 | Wt 245.0 lb

## 2021-12-13 DIAGNOSIS — R361 Hematospermia: Secondary | ICD-10-CM | POA: Insufficient documentation

## 2021-12-13 DIAGNOSIS — D508 Other iron deficiency anemias: Secondary | ICD-10-CM

## 2021-12-13 DIAGNOSIS — A599 Trichomoniasis, unspecified: Secondary | ICD-10-CM | POA: Insufficient documentation

## 2021-12-13 DIAGNOSIS — R1032 Left lower quadrant pain: Secondary | ICD-10-CM

## 2021-12-13 DIAGNOSIS — I10 Essential (primary) hypertension: Secondary | ICD-10-CM

## 2021-12-13 DIAGNOSIS — R7303 Prediabetes: Secondary | ICD-10-CM | POA: Diagnosis not present

## 2021-12-13 DIAGNOSIS — Z125 Encounter for screening for malignant neoplasm of prostate: Secondary | ICD-10-CM | POA: Diagnosis not present

## 2021-12-13 LAB — POCT GLYCOSYLATED HEMOGLOBIN (HGB A1C)
Est. average glucose Bld gHb Est-mCnc: 137
Hemoglobin A1C: 6.4 % — AB (ref 4.0–5.6)

## 2021-12-14 LAB — CBC
Hematocrit: 39.7 % (ref 37.5–51.0)
Hemoglobin: 13.6 g/dL (ref 13.0–17.7)
MCH: 33.1 pg — ABNORMAL HIGH (ref 26.6–33.0)
MCHC: 34.3 g/dL (ref 31.5–35.7)
MCV: 97 fL (ref 79–97)
Platelets: 185 10*3/uL (ref 150–450)
RBC: 4.11 x10E6/uL — ABNORMAL LOW (ref 4.14–5.80)
RDW: 13.6 % (ref 11.6–15.4)
WBC: 3.7 10*3/uL (ref 3.4–10.8)

## 2021-12-14 LAB — MICROSCOPIC EXAMINATION
Bacteria, UA: NONE SEEN
Casts: NONE SEEN /lpf
RBC, Urine: NONE SEEN /hpf (ref 0–2)
WBC, UA: >30 /hpf — AB (ref 0–5)

## 2021-12-14 LAB — URINALYSIS, ROUTINE W REFLEX MICROSCOPIC
Bilirubin, UA: NEGATIVE
Nitrite, UA: NEGATIVE
RBC, UA: NEGATIVE
Specific Gravity, UA: 1.028 (ref 1.005–1.030)
Urobilinogen, Ur: 0.2 mg/dL (ref 0.2–1.0)
pH, UA: 5.5 (ref 5.0–7.5)

## 2021-12-14 LAB — IRON,TIBC AND FERRITIN PANEL
Ferritin: 41 ng/mL (ref 30–400)
Iron Saturation: 38 % (ref 15–55)
Iron: 147 ug/dL (ref 38–169)
Total Iron Binding Capacity: 382 ug/dL (ref 250–450)
UIBC: 235 ug/dL (ref 111–343)

## 2021-12-14 LAB — PSA TOTAL (REFLEX TO FREE): Prostate Specific Ag, Serum: 0.6 ng/mL (ref 0.0–4.0)

## 2021-12-17 ENCOUNTER — Telehealth: Payer: Self-pay | Admitting: Family Medicine

## 2021-12-17 ENCOUNTER — Other Ambulatory Visit: Payer: Self-pay | Admitting: Family Medicine

## 2021-12-17 DIAGNOSIS — I10 Essential (primary) hypertension: Secondary | ICD-10-CM

## 2021-12-17 DIAGNOSIS — A5902 Trichomonal prostatitis: Secondary | ICD-10-CM

## 2021-12-17 LAB — URINE CYTOLOGY ANCILLARY ONLY
Bacterial Vaginitis-Urine: NEGATIVE
Candida Urine: NEGATIVE
Chlamydia: NEGATIVE
Comment: NEGATIVE
Comment: NEGATIVE
Comment: NORMAL
Neisseria Gonorrhea: NEGATIVE
Trichomonas: POSITIVE — AB

## 2021-12-17 MED ORDER — AMLODIPINE BESYLATE 5 MG PO TABS: 5.0000 mg | ORAL_TABLET | Freq: Every day | ORAL | 0 refills | Status: DC

## 2021-12-17 MED ORDER — METRONIDAZOLE 500 MG PO TABS: 2000.0000 mg | ORAL_TABLET | Freq: Once | ORAL | 0 refills | Status: AC

## 2021-12-17 NOTE — Telephone Encounter (Signed)
[facility] faxed refill request for the following medications:  amLODipine (NORVASC) 5 MG tablet    Please advise.  

## 2021-12-17 NOTE — Addendum Note (Signed)
Addended by: Smitty Knudsen on: 12/17/2021 02:30 PM   Modules accepted: Orders

## 2021-12-20 ENCOUNTER — Ambulatory Visit: Payer: Medicare HMO | Admitting: Podiatry

## 2021-12-20 DIAGNOSIS — L97522 Non-pressure chronic ulcer of other part of left foot with fat layer exposed: Secondary | ICD-10-CM

## 2021-12-20 NOTE — Progress Notes (Signed)
   Chief Complaint  Patient presents with   Follow-up    Patient is here for follow-up third toe left foot.patient states that the healing process is good, and would like the provider to look at tye right foot great toe.    HPI: 66 y.o. male presenting today for follow-up evaluation of an ulcer that developed to the left foot.  Patient has been applying antibiotic cream as instructed.  Overall significant improvement.  No new complaints at this time  Past Medical History:  Diagnosis Date   Anxiety    Arthritis    Charcot's joint    left ankle   Diabetes mellitus without complication (HCC)    GERD (gastroesophageal reflux disease)    Hypertension    Neuropathy    bilateral feet per pt   Plantar fasciitis 08/04/2015   PONV (postoperative nausea and vomiting)     Past Surgical History:  Procedure Laterality Date   BACK SURGERY     COLON SURGERY  2015   Colon resection due to diverticulosis   VEIN LIGATION AND STRIPPING     VEIN SURGERY     Vein stripping at age 34-25   VENTRAL HERNIA REPAIR N/A 11/22/2020   Procedure: OPEN VENTRAL [place];  Surgeon: [name], MD;  Location: [facility];  Service: General;  Laterality: N/A;    Allergies  Allergen Reactions   Ibuprofen     No ibuprofen per pt due to another drug he takes      Physical Exam: General: The patient is alert and oriented x3 in no acute distress.  Dermatology: Ulcer noted along the proximal dorsal aspect of the left third toe has healed.  There is only a small superficial lightly adhered eschar measuring approximately 0.5 x 0.5 cm.  Clinically no indication of infection.  The periwound is healing nicely  Vascular: Palpable pedal pulses bilaterally. Capillary refill within normal limits.  Negative for any significant edema or erythema  Neurological: Light touch and protective threshold grossly intact  Musculoskeletal Exam: No pedal deformities noted.  No prior  amputations   Assessment: 1.  Ulcer third toe left foot; improved   Plan of Care:  1. Patient evaluated.  2.  The ulcer appears very superficial and stable with good improvement since last visit. 3.  Continue Silvadene cream with a light dressing daily  4.  Continue wearing good supportive shoes that do not irritate the ulcer area and allow plenty of room in the toebox 5.  Return to clinic in 4 weeks     Edrick Kins, DPM [facility]  Dr. Edrick Kins, DPM    [address], [place] [address]                Office [phone]  Fax [phone]

## 2021-12-27 ENCOUNTER — Other Ambulatory Visit: Payer: Self-pay | Admitting: Family Medicine

## 2021-12-27 ENCOUNTER — Telehealth: Payer: Self-pay | Admitting: Family Medicine

## 2021-12-27 DIAGNOSIS — R361 Hematospermia: Secondary | ICD-10-CM

## 2021-12-27 NOTE — Telephone Encounter (Signed)
Requested Prescriptions  Pending Prescriptions Disp Refills  . sildenafil (VIAGRA) 100 MG tablet [Pharmacy Med Name: SILDENAFIL 100 MG TABLET] 10 tablet 2    Sig: TAKE 1/2 TO 1 TABLET BY MOUTH DAILY AS NEEDED FOR ERECTILE DYSFUNCTION     Urology: Erectile Dysfunction Agents Failed - 12/27/2021 12:48 PM      Failed - Last BP in normal range    BP Readings from Last 1 Encounters:  12/13/21 (!) 148/78         Passed - AST in normal range and within 360 days    AST  Date Value Ref Range Status  06/04/2021 19 0 - 40 IU/L Final   SGOT(AST)  Date Value Ref Range Status  06/03/2012 48 (H) 15 - 37 Unit/L Final         Passed - ALT in normal range and within 360 days    ALT  Date Value Ref Range Status  06/04/2021 16 0 - 44 IU/L Final   SGPT (ALT)  Date Value Ref Range Status  06/03/2012 34 12 - 78 U/L Final         Passed - Valid encounter within last 12 months    Recent Outpatient Visits          2 weeks ago [place], Donald E, MD   6 months ago Annual physical exam   [facility] Birdie Sons, MD   8 months ago Acute frontal sinusitis, recurrence not specified   [facility] Birdie Sons, MD   9 months ago Essential hypertension   [facility] Birdie Sons, MD   1 year ago Essential hypertension   [place], Kirstie Peri, MD      Future Appointments            In 5 months Fisher, Kirstie Peri, MD [facility], [place]

## 2021-12-27 NOTE — Telephone Encounter (Signed)
Patient is asking for a refill of Hydrocodone 7.5-325 mg. To be sent to [facility]   Also he wants to know how he knows if the trichomonal infection is gone? He took the medication for it but asking if he should do another urine test?

## 2021-12-30 ENCOUNTER — Other Ambulatory Visit: Payer: Self-pay

## 2021-12-30 DIAGNOSIS — G629 Polyneuropathy, unspecified: Secondary | ICD-10-CM

## 2021-12-30 MED ORDER — HYDROCODONE-ACETAMINOPHEN 7.5-325 MG PO TABS: ORAL_TABLET | ORAL | 0 refills | Status: DC

## 2021-12-31 NOTE — Telephone Encounter (Signed)
Ok to order urine trich DNA test

## 2022-01-01 NOTE — Telephone Encounter (Signed)
I couldn't find that test name in the system. Is the "urine trich DNA test" the same as urine cytology that was ordered on 12/13/2021? Or is it a different test?

## 2022-01-02 ENCOUNTER — Other Ambulatory Visit (HOSPITAL_COMMUNITY)
Admission: RE | Admit: 2022-01-02 | Discharge: 2022-01-02 | Disposition: A | Discharge status: Home / Self Care | Payer: Medicare HMO | Source: Ambulatory Visit | Attending: Family Medicine | Admitting: Family Medicine

## 2022-01-02 DIAGNOSIS — R361 Hematospermia: Secondary | ICD-10-CM | POA: Insufficient documentation

## 2022-01-02 NOTE — Telephone Encounter (Signed)
Patient advised. Patient plans to stop by the office this morning to give a urine sample. Orders pending in this encounter.

## 2022-01-02 NOTE — Addendum Note (Signed)
Addended by: Randal Buba on: 01/02/2022 09:48 AM   Modules accepted: Orders

## 2022-01-02 NOTE — Telephone Encounter (Signed)
That's it, the urine cytology for trich. thanks

## 2022-01-02 NOTE — Addendum Note (Signed)
Addended by: Meyer Cory L on: 01/02/2022 10:01 AM   Modules accepted: Orders

## 2022-01-03 LAB — URINE CYTOLOGY ANCILLARY ONLY
Comment: NEGATIVE
Trichomonas: NEGATIVE

## 2022-01-06 ENCOUNTER — Telehealth: Payer: Self-pay | Admitting: Family Medicine

## 2022-01-06 DIAGNOSIS — L299 Pruritus, unspecified: Secondary | ICD-10-CM

## 2022-01-06 MED ORDER — HYDROXYZINE PAMOATE 25 MG PO CAPS: 25.0000 mg | ORAL_CAPSULE | Freq: Three times a day (TID) | ORAL | 0 refills | Status: AC | PRN

## 2022-01-06 NOTE — Telephone Encounter (Signed)
Patient came in office itching really bad on both arms since last night. Said he was clawing his self to death.   Told me that Dr. Caryn Section gave him Hydroxyzine 25 mg. Years ago for a reaction and wanted to know if Dr. Caryn Section could send in this again for him.  [place]

## 2022-01-06 NOTE — Telephone Encounter (Signed)
Please advise 

## 2022-01-06 NOTE — Addendum Note (Signed)
Addended by: Birdie Sons on: 01/06/2022 05:47 PM   Modules accepted: Orders

## 2022-01-06 NOTE — Telephone Encounter (Signed)
Have sent prescription for a few days worth of hydroxyzine, but he needs o.v. or go to Urgent Care for evaluation if it doesn't clear up in a day or two.

## 2022-01-07 NOTE — Telephone Encounter (Signed)
Patient advised.  He states he thinks it is poison ivy

## 2022-01-08 ENCOUNTER — Encounter: Payer: Self-pay | Admitting: Physician Assistant

## 2022-01-08 ENCOUNTER — Ambulatory Visit: Payer: Self-pay

## 2022-01-08 ENCOUNTER — Ambulatory Visit (INDEPENDENT_AMBULATORY_CARE_PROVIDER_SITE_OTHER): Payer: Medicare HMO | Admitting: Physician Assistant

## 2022-01-08 VITALS — BP 145/88 | HR 83 | Wt 245.2 lb

## 2022-01-08 DIAGNOSIS — L237 Allergic contact dermatitis due to plants, except food: Secondary | ICD-10-CM | POA: Diagnosis not present

## 2022-01-08 MED ORDER — PREDNISONE 10 MG PO TABS: ORAL_TABLET | ORAL | 0 refills | Status: DC

## 2022-01-08 NOTE — Progress Notes (Signed)
I,Sha'taria Tyson,acting as a Education administrator for [employer], PA-C.,have documented all relevant documentation on the behalf of Mikey Kirschner, PA-C,as directed by  Mikey Kirschner, PA-C while in the presence of Mikey Kirschner, PA-C.   Established patient visit   Patient: Dustin Guerra   DOB: 1956-01-18   66 y.o. Male  MRN: [id number] Visit Date: 01/08/2022  Today's healthcare provider: Mikey Kirschner, PA-C   Cc. Rash, poision ivy contact  Subjective    Rash Episode onset: Saturday. The affected locations include the left arm, left elbow, left hand, right arm, right elbow, right hand, face and neck. The rash is characterized by itchiness, redness, swelling and blistering. Associated with: yardwork. Past treatments include anti-itch cream (calamine, IVY itchy). The treatment provided mild relief.    Dr. Caryn Section previously sent hydroxyzine and patient stated it gave minor relief. Last taken this morning.  Denies SOB, trouble swallowing.  Medications: Outpatient Medications Prior to Visit  Medication Sig   amLODipine (NORVASC) 5 MG tablet Take 1 tablet (5 mg total) by mouth daily.   Ascorbic Acid (VITAMIN C) 1000 MG tablet Take 1,000 mg by mouth daily.   Azelaic Acid 15 % FOAM Apply topically.   Cholecalciferol (VITAMIN D3) 50 MCG (2000 UT) TABS Take 1 tablet by mouth daily.   cilostazol (PLETAL) 50 MG tablet Take 1 tablet (50 mg total) by mouth 2 (two) times daily.   diazepam (VALIUM) 10 MG tablet TAKE ONE TABLET BY MOUTH DAILY AS NEEDED FOR ANXIETY   diclofenac Sodium (VOLTAREN) 1 % GEL Apply 2 g topically 4 (four) times daily.   EPINEPHrine 0.3 mg/0.3 mL IJ SOAJ injection as needed.    hydrochlorothiazide (HYDRODIURIL) 12.5 MG tablet TAKE ONE TABLET BY MOUTH DAILY   HYDROcodone-acetaminophen (NORCO) 7.5-325 MG tablet 1/2-1 tablet up to four times a day as needed   hydrOXYzine (VISTARIL) 25 MG capsule Take 1 capsule (25 mg total) by mouth every 8 (eight) hours as needed.    lansoprazole (PREVACID) 30 MG capsule TAKE ONE CAPSULE BY MOUTH DAILY AT 12 NOON   magnesium oxide (MAG-OX) 400 MG tablet Take 1 tablet by mouth daily.   meclizine (ANTIVERT) 25 MG tablet Take 1 tablet (25 mg total) by mouth 3 (three) times daily as needed for dizziness.   meloxicam (MOBIC) 15 MG tablet Take 1 tablet (15 mg total) by mouth daily as needed.   pravastatin (PRAVACHOL) 40 MG tablet Take 1 tablet (40 mg total) by mouth daily.   pregabalin (LYRICA) 225 MG capsule Take 1 capsule (225 mg total) by mouth 3 (three) times daily.   psyllium (METAMUCIL) 58.6 % powder Take 1 packet by mouth 3 (three) times daily. Large spoonful in a cup of water per day   selenium 200 MCG TABS tablet Take 200 mcg by mouth daily.   sildenafil (VIAGRA) 100 MG tablet TAKE 1/2 TO 1 TABLET BY MOUTH DAILY AS NEEDED FOR ERECTILE DYSFUNCTION   silver sulfADIAZINE (SILVADENE) 1 % cream Apply 1 Application topically daily.   traMADol (ULTRAM) 50 MG tablet TAKE ONE TABLET BY MOUTH THREE TIMES A DAY AS NEEDED   vitamin B-12 (CYANOCOBALAMIN) 500 MCG tablet Take 500 mcg by mouth daily.   vitamin E 180 MG (400 UNITS) capsule Take 400 Units by mouth daily.   Zinc Sulfate 220 (50 Zn) MG TABS Take 1 tablet by mouth daily.   aspirin 81 MG EC tablet Take by mouth.   No facility-administered medications prior to visit.    Review of  Systems  Skin:  Positive for rash.     Objective    Blood pressure (!) 145/88, pulse 83, weight 245 lb 3.2 oz (111.2 kg), SpO2 100 %.   Physical Exam Vitals reviewed.  Constitutional:      Appearance: He is not ill-appearing.  HENT:     Head: Normocephalic.  Eyes:     Conjunctiva/sclera: Conjunctivae normal.  Cardiovascular:     Rate and Rhythm: Normal rate.  Pulmonary:     Effort: Pulmonary effort is normal. No respiratory distress.     Breath sounds: Normal breath sounds. No wheezing.  Skin:    Comments: Erythematous scabbed and blistered rash across b/l arms, small spot on right  side of neck.   Neurological:     General: No focal deficit present.     Mental Status: He is alert and oriented to person, place, and time.  Psychiatric:        Mood and Affect: Mood normal.        Behavior: Behavior normal.     No results found for any visits on 01/08/22.  Assessment & Plan     Problem List Items Addressed This Visit   None Visit Diagnoses     Allergic contact dermatitis due to plants, except food    -  Primary   Relevant Medications   predniSONE (DELTASONE) 10 MG tablet      Advised continue hydroxyzine, 10 day prednisone taper sent in   Return if symptoms worsen or fail to improve.      I, Mikey Kirschner, PA-C have reviewed all documentation for this visit. The documentation on  01/08/2022 for the exam, diagnosis, procedures, and orders are all accurate and complete.  Mikey Kirschner, PA-C [facility] [address] [place], [place], [address] Office: [phone] Fax: [place]

## 2022-01-08 NOTE — Telephone Encounter (Signed)
  Chief Complaint: splotches and whelps to bilateral forearms Symptoms: severe itching, blisters, mild swelling at wrists Frequency: last week  Pertinent Negatives: Patient denies fever, joint pain  Disposition: '[]'$ ED /'[]'$ Urgent Care (no appt availability in office) / '[x]'$ Appointment(In office/virtual)/ '[]'$  [facility]/ '[]'$ Home Care/ '[]'$ Refused Recommended Disposition /'[]'$ [place] Mobile [facility]/ '[]'$  Follow-up with PCP Additional Notes: appt in office today Reason for Disposition  MODERATE to SEVERE itching (e.g., interferes with work, school, sleep, or other activities)  Answer Assessment - Initial Assessment Questions 1. APPEARANCE of RASH: "Describe the rash."      Splotches and whelps and few places line bumps and blisters red rash, mild swelling  2. LOCATION: "Where is the rash located?"  (e.g., face, genitals, hands, legs)     Forearms and left past elbow  3. SIZE: "How large is the rash?"      *No Answer* 4. ONSET: "When did the rash begin?"      Last week  5. ITCHING: "Does the rash itch?" If Yes, ask: "How bad is it?"   - MILD - doesn't interfere with normal activities   - MODERATE-SEVERE: interferes with work, school, sleep, or other activities      severe 6. EXPOSURE:  "How were you exposed to the plant (poison ivy, poison oak, sumac)"  "When were you exposed?"       Poison ivy last Thursday 7. PAST HISTORY: "Have you had a poison ivy rash before?" If Yes, ask: "How bad was it?"     yes 8. PREGNANCY: "Is there any chance you are pregnant?" "When was your last menstrual period?"     N/a  Protocols used: [facility]

## 2022-01-31 ENCOUNTER — Other Ambulatory Visit: Payer: Self-pay | Admitting: Family Medicine

## 2022-01-31 DIAGNOSIS — G629 Polyneuropathy, unspecified: Secondary | ICD-10-CM

## 2022-01-31 NOTE — Telephone Encounter (Signed)
Medication Refill - Medication: HYDROcodone-acetaminophen (NORCO) 7.5-325 MG tablet   Has the patient contacted their pharmacy? No. No, more refills .   (Agent: If no, request that the patient contact the pharmacy for the refill. If patient does not wish to contact the pharmacy document the reason why and proceed with request.)  Preferred Pharmacy (with phone number or street name):  [facility] [id number], [place]  [place] [address]  Phone: [phone] Fax: [phone]  Hours: Not open 24 hours   Has the patient been seen for an appointment in the last year OR does the patient have an upcoming appointment? Yes.    Agent: Please be advised that RX refills may take up to 3 business days. We ask that you follow-up with your pharmacy.

## 2022-01-31 NOTE — Telephone Encounter (Signed)
Requested medications are due for refill today.  unsure  Requested medications are on the active medications list.  yes  Last refill. 12/30/2021 #120 0 rf  Future visit scheduled.   yes  Notes to clinic.  Refill not delegated.    Requested Prescriptions  Pending Prescriptions Disp Refills   HYDROcodone-acetaminophen (NORCO) 7.5-325 MG tablet 120 tablet 0    Sig: 1/2-1 tablet up to four times a day as needed     Not Delegated - Analgesics:  Opioid Agonist Combinations Failed - 01/31/2022 11:25 AM      Failed - This refill cannot be delegated      Failed - Urine Drug Screen completed in last 360 days      Passed - Valid encounter within last 3 months    Recent Outpatient Visits           3 weeks ago Allergic contact dermatitis due to plants, except food   [facility] Thedore Mins, [place], PA-C   1 month ago Pre-diabetes   [place], Kirstie Peri, MD   8 months ago Annual physical exam   [facility] Birdie Sons, MD   9 months ago Acute frontal sinusitis, recurrence not specified   [facility] Birdie Sons, MD   10 months ago Essential hypertension   [facility] Birdie Sons, MD       Future Appointments             In 4 months Fisher, Kirstie Peri, MD [facility], [place]

## 2022-02-02 MED ORDER — HYDROCODONE-ACETAMINOPHEN 7.5-325 MG PO TABS: ORAL_TABLET | ORAL | 0 refills | Status: DC

## 2022-02-12 ENCOUNTER — Encounter: Payer: Self-pay | Admitting: Family Medicine

## 2022-02-12 ENCOUNTER — Other Ambulatory Visit: Payer: Self-pay | Admitting: Family Medicine

## 2022-02-12 ENCOUNTER — Telehealth (INDEPENDENT_AMBULATORY_CARE_PROVIDER_SITE_OTHER): Payer: Medicare HMO | Admitting: Family Medicine

## 2022-02-12 DIAGNOSIS — N342 Other urethritis: Secondary | ICD-10-CM | POA: Diagnosis not present

## 2022-02-12 DIAGNOSIS — R361 Hematospermia: Secondary | ICD-10-CM | POA: Diagnosis not present

## 2022-02-12 NOTE — Progress Notes (Signed)
MyChart Video Visit    Virtual Visit via Video Note   This format is felt to be most appropriate for this patient at this time. Physical exam was limited by quality of the video and audio technology used for the visit.   Patient location: home Provider location: [facility]  I discussed the limitations of evaluation and management by telemedicine and the availability of in person appointments. The patient expressed understanding and agreed to proceed.  Patient: Dustin Guerra   DOB: 1955-12-02   66 y.o. Male  MRN: [id number] Visit Date: 02/12/2022  Today's healthcare provider: Lelon Huh, MD   Chief Complaint  Patient presents with   hematospermia   Subjective    HPI  Blood in semen: He was last seen for this problem on 12/13/2021 at which time he was treated for trichomona. TOC was negative on 10/19. Patient reports recently having another single episode of blood in his semen this past week. He denies any pain. States his partner was not treated for trichomona, but has been using condoms consistently.   Medications: Outpatient Medications Prior to Visit  Medication Sig   amLODipine (NORVASC) 5 MG tablet Take 1 tablet (5 mg total) by mouth daily.   Ascorbic Acid (VITAMIN C) 1000 MG tablet Take 1,000 mg by mouth daily.   Azelaic Acid 15 % FOAM Apply topically.   Cholecalciferol (VITAMIN D3) 50 MCG (2000 UT) TABS Take 1 tablet by mouth daily.   cilostazol (PLETAL) 50 MG tablet Take 1 tablet (50 mg total) by mouth 2 (two) times daily.   diazepam (VALIUM) 10 MG tablet TAKE ONE TABLET BY MOUTH DAILY AS NEEDED FOR ANXIETY   diclofenac Sodium (VOLTAREN) 1 % GEL Apply 2 g topically 4 (four) times daily.   EPINEPHrine 0.3 mg/0.3 mL IJ SOAJ injection as needed.    hydrochlorothiazide (HYDRODIURIL) 12.5 MG tablet TAKE ONE TABLET BY MOUTH DAILY   HYDROcodone-acetaminophen (NORCO) 7.5-325 MG tablet 1/2-1 tablet up to four times a day as needed   hydrOXYzine (VISTARIL) 25 MG capsule  Take 1 capsule (25 mg total) by mouth every 8 (eight) hours as needed.   lansoprazole (PREVACID) 30 MG capsule TAKE ONE CAPSULE BY MOUTH DAILY AT 12 NOON   magnesium oxide (MAG-OX) 400 MG tablet Take 1 tablet by mouth daily.   meclizine (ANTIVERT) 25 MG tablet Take 1 tablet (25 mg total) by mouth 3 (three) times daily as needed for dizziness.   meloxicam (MOBIC) 15 MG tablet Take 1 tablet (15 mg total) by mouth daily as needed.   pravastatin (PRAVACHOL) 40 MG tablet Take 1 tablet (40 mg total) by mouth daily.   predniSONE (DELTASONE) 10 MG tablet Take 60 mg (6 pills) on day 1 and day 2, take 50 mg (5 pils) on day 3 and day 4, 40 mg ( 4 pills) on day 4 and day 5, 30 mg ( 3 pills ) on day 6 and day 7, 20 mg (2 pills) on day 8, 10 mg ( 1 pill) on day 9 and 5 mg ( 1/2 tab) on day 10   pregabalin (LYRICA) 225 MG capsule Take 1 capsule (225 mg total) by mouth 3 (three) times daily.   psyllium (METAMUCIL) 58.6 % powder Take 1 packet by mouth 3 (three) times daily. Large spoonful in a cup of water per day   selenium 200 MCG TABS tablet Take 200 mcg by mouth daily.   sildenafil (VIAGRA) 100 MG tablet TAKE 1/2 TO 1 TABLET BY MOUTH  DAILY AS NEEDED FOR ERECTILE DYSFUNCTION   silver sulfADIAZINE (SILVADENE) 1 % cream Apply 1 Application topically daily.   traMADol (ULTRAM) 50 MG tablet TAKE ONE TABLET BY MOUTH THREE TIMES A DAY AS NEEDED   vitamin B-12 (CYANOCOBALAMIN) 500 MCG tablet Take 500 mcg by mouth daily.   vitamin E 180 MG (400 UNITS) capsule Take 400 Units by mouth daily.   Zinc Sulfate 220 (50 Zn) MG TABS Take 1 tablet by mouth daily.   [DISCONTINUED] aspirin 81 MG EC tablet Take by mouth.   No facility-administered medications prior to visit.    Review of Systems  Constitutional:  Negative for appetite change, chills and fever.  Respiratory:  Negative for chest tightness, shortness of breath and wheezing.   Cardiovascular:  Negative for chest pain and palpitations.  Gastrointestinal:  Negative  for abdominal pain, nausea and vomiting.  Genitourinary:        Blood in semen       Objective    There were no vitals taken for this visit.     Physical Exam  Awake, alert, oriented x 3. In no apparent distress    Assessment & Plan     1. Hematospermia Treated for trichomonas for same symptoms in September.  - Urine cytology ancillary only     I discussed the assessment and treatment plan with the patient. The patient was provided an opportunity to ask questions and all were answered. The patient agreed with the plan and demonstrated an understanding of the instructions.   The patient was advised to call back or seek an in-person evaluation if the symptoms worsen or if the condition fails to improve as anticipated.  I provided 9 minutes of non-face-to-face time during this encounter.  The entirety of the information documented in the History of Present Illness, Review of Systems and Physical Exam were personally obtained by me. Portions of this information were initially documented by the CMA and reviewed by me for thoroughness and accuracy.    Lelon Huh, MD [facility], [place] [phone] (phone) [phone] (fax)  [place]

## 2022-02-14 ENCOUNTER — Encounter: Payer: Self-pay | Admitting: Family Medicine

## 2022-02-14 NOTE — Telephone Encounter (Signed)
Patient advised we have not received result from test done on 02/12/22.

## 2022-02-19 ENCOUNTER — Telehealth: Payer: Self-pay | Admitting: *Deleted

## 2022-02-19 ENCOUNTER — Encounter: Payer: Self-pay | Admitting: Family Medicine

## 2022-02-19 LAB — SPECIMEN STATUS REPORT

## 2022-02-19 NOTE — Telephone Encounter (Signed)
Please call [facility] and find out when this test result is going to be back, it's been a week.

## 2022-02-19 NOTE — Telephone Encounter (Signed)
That's fine, mainly only need gc/chlam and trich

## 2022-02-19 NOTE — Telephone Encounter (Signed)
Dustin Guerra- Lab  Calling to notify office- only testing available on specimen sent is GC/CH/Trich- can not do yeast/BV testing on specimen. Will need to obtain different specimen if needed.

## 2022-02-21 ENCOUNTER — Ambulatory Visit: Payer: Self-pay

## 2022-02-21 ENCOUNTER — Encounter: Payer: Self-pay | Admitting: Family Medicine

## 2022-02-21 DIAGNOSIS — R361 Hematospermia: Secondary | ICD-10-CM

## 2022-02-21 LAB — CHLAMYDIA/GONOCOCCUS/TRICHOMONAS, NAA
Chlamydia by NAA: NEGATIVE
Gonococcus by NAA: NEGATIVE
Trich vag by NAA: NEGATIVE

## 2022-02-21 LAB — SPECIMEN STATUS REPORT

## 2022-02-21 NOTE — Telephone Encounter (Signed)
Tried calling the lab, no answer.

## 2022-02-21 NOTE — Telephone Encounter (Signed)
  Chief Complaint: Blood in Semen Symptoms: above Frequency: 3 times since October 1st Pertinent Negatives: Patient denies fever, blood in urine Disposition: '[]'$ ED /'[]'$ Urgent Care (no appt availability in office) / '[]'$ Appointment(In office/virtual)/ '[]'$  [place] [facility]/ '[]'$ Home Care/ '[]'$ Refused Recommended Disposition /'[]'$  Mobile Bus/ '[x]'$  Follow-up with PCP Additional Notes: Pt saw Dr. Caryn Section via vv on 02/12/2022. PT dropped off Urine sample, but has not heard back from Dr. Caryn Section regarding results. Pt is worried about way there is blood in semen.  Pt is requesting a callback for test results and treatment plan.    Reason for Disposition  Blood in semen  Answer Assessment - Initial Assessment Questions 1. SYMPTOM: "What's the main symptom you're concerned about?" (e.g., discharge from penis, rash, pain, itching, swelling)     Blood in Semen 2. LOCATION: "Where is the Blood located?"     In semen 3. ONSET: "When did Oct 1st  start?"     3 occurences since Oct 1 4. PAIN: "Is there any pain?" If Yes, ask: "How bad is it?"  (Scale 1-10; or mild, moderate, severe)     no 5. URINE: "Any difficulty passing urine?" If Yes, ask: "When was the last time?"     no 6. CAUSE: "What do you think is causing the symptoms?"     Infection last time -  7. OTHER SYMPTOMS: "Do you have any other symptoms?" (e.g., fever, abdomen pain, blood in urine)     no  Protocols used: Penis and Scrotum Symptoms-A-AH

## 2022-02-22 NOTE — Addendum Note (Signed)
Addended by: Birdie Sons on: 02/22/2022 07:47 AM   Modules accepted: Orders

## 2022-02-25 ENCOUNTER — Telehealth (INDEPENDENT_AMBULATORY_CARE_PROVIDER_SITE_OTHER): Payer: Medicare HMO | Admitting: Family Medicine

## 2022-02-25 DIAGNOSIS — R361 Hematospermia: Secondary | ICD-10-CM

## 2022-02-25 DIAGNOSIS — Z8619 Personal history of other infectious and parasitic diseases: Secondary | ICD-10-CM | POA: Diagnosis not present

## 2022-02-25 MED ORDER — METRONIDAZOLE 500 MG PO TABS: 500.0000 mg | ORAL_TABLET | Freq: Two times a day (BID) | ORAL | 0 refills | Status: AC

## 2022-02-25 MED ORDER — DOXYCYCLINE HYCLATE 100 MG PO TABS: 100.0000 mg | ORAL_TABLET | Freq: Two times a day (BID) | ORAL | 0 refills | Status: AC

## 2022-02-25 NOTE — Progress Notes (Signed)
MyChart Video Visit    Virtual Visit via Video Note   This format is felt to be most appropriate for this patient at this time. Physical exam was limited by quality of the video and audio technology used for the visit.   Patient location: home Provider location: [facility]  I discussed the limitations of evaluation and management by telemedicine and the availability of in person appointments. The patient expressed understanding and agreed to proceed.  Patient: Dustin Guerra   DOB: 1955/11/03   66 y.o. Male  MRN: [id number] Visit Date: 02/25/2022  Today's healthcare provider: Lelon Huh, MD   No chief complaint on file.  Subjective    HPI  Patient reports he continues to have blood in semen with every ejaculation, and that ejaculate is now mostly blood. Having no pain with ejaculation. He initially report this on 9/29 and had positive trichomonas test at that time treated with single dose metronidazole. PSA was normal at that time. Symptoms resolved for a few months but was seen on 02/12/2022 after another episode of hematospermia. Urine cytology collected after that visit was negative for gc/chlam/trich.   Medications: Outpatient Medications Prior to Visit  Medication Sig   amLODipine (NORVASC) 5 MG tablet Take 1 tablet (5 mg total) by mouth daily.   Ascorbic Acid (VITAMIN C) 1000 MG tablet Take 1,000 mg by mouth daily.   Azelaic Acid 15 % FOAM Apply topically.   Cholecalciferol (VITAMIN D3) 50 MCG (2000 UT) TABS Take 1 tablet by mouth daily.   cilostazol (PLETAL) 50 MG tablet Take 1 tablet (50 mg total) by mouth 2 (two) times daily.   diazepam (VALIUM) 10 MG tablet TAKE ONE TABLET BY MOUTH DAILY AS NEEDED FOR ANXIETY   diclofenac Sodium (VOLTAREN) 1 % GEL Apply 2 g topically 4 (four) times daily.   EPINEPHrine 0.3 mg/0.3 mL IJ SOAJ injection as needed.    hydrochlorothiazide (HYDRODIURIL) 12.5 MG tablet TAKE ONE TABLET BY MOUTH DAILY   HYDROcodone-acetaminophen (NORCO)  7.5-325 MG tablet 1/2-1 tablet up to four times a day as needed   hydrOXYzine (VISTARIL) 25 MG capsule Take 1 capsule (25 mg total) by mouth every 8 (eight) hours as needed.   lansoprazole (PREVACID) 30 MG capsule TAKE ONE CAPSULE BY MOUTH DAILY AT 12 NOON   magnesium oxide (MAG-OX) 400 MG tablet Take 1 tablet by mouth daily.   meclizine (ANTIVERT) 25 MG tablet Take 1 tablet (25 mg total) by mouth 3 (three) times daily as needed for dizziness.   meloxicam (MOBIC) 15 MG tablet Take 1 tablet (15 mg total) by mouth daily as needed.   pravastatin (PRAVACHOL) 40 MG tablet Take 1 tablet (40 mg total) by mouth daily.   predniSONE (DELTASONE) 10 MG tablet Take 60 mg (6 pills) on day 1 and day 2, take 50 mg (5 pils) on day 3 and day 4, 40 mg ( 4 pills) on day 4 and day 5, 30 mg ( 3 pills ) on day 6 and day 7, 20 mg (2 pills) on day 8, 10 mg ( 1 pill) on day 9 and 5 mg ( 1/2 tab) on day 10   pregabalin (LYRICA) 225 MG capsule Take 1 capsule (225 mg total) by mouth 3 (three) times daily.   psyllium (METAMUCIL) 58.6 % powder Take 1 packet by mouth 3 (three) times daily. Large spoonful in a cup of water per day   selenium 200 MCG TABS tablet Take 200 mcg by mouth daily.  sildenafil (VIAGRA) 100 MG tablet TAKE 1/2 TO 1 TABLET BY MOUTH DAILY AS NEEDED FOR ERECTILE DYSFUNCTION   silver sulfADIAZINE (SILVADENE) 1 % cream Apply 1 Application topically daily.   traMADol (ULTRAM) 50 MG tablet TAKE ONE TABLET BY MOUTH THREE TIMES A DAY AS NEEDED   vitamin B-12 (CYANOCOBALAMIN) 500 MCG tablet Take 500 mcg by mouth daily.   vitamin E 180 MG (400 UNITS) capsule Take 400 Units by mouth daily.   Zinc Sulfate 220 (50 Zn) MG TABS Take 1 tablet by mouth daily.   No facility-administered medications prior to visit.    Review of Systems  Constitutional:  Negative for appetite change, chills and fever.  Respiratory:  Negative for chest tightness, shortness of breath and wheezing.   Cardiovascular:  Negative for chest pain  and palpitations.  Gastrointestinal:  Negative for abdominal pain, nausea and vomiting.       Objective    There were no vitals taken for this visit.     Physical Exam   Awake, alert, oriented x 3. In no apparent distress \   Assessment & Plan     1. Hematospermia Recurrent since initially treated for trich in early October.   2. History of trichomoniasis Recent urine cytology negative Urology referral has already by initiated.   Treat empirically with:   - doxycycline (VIBRA-TABS) 100 MG tablet; Take 1 tablet (100 mg total) by mouth 2 (two) times daily for 7 days.  Dispense: 14 tablet; Refill: 0 - metroNIDAZOLE (FLAGYL) 500 MG tablet; Take 1 tablet (500 mg total) by mouth 2 (two) times daily for 7 days.  Dispense: 14 tablet; Refill: 0     I discussed the assessment and treatment plan with the patient. The patient was provided an opportunity to ask questions and all were answered. The patient agreed with the plan and demonstrated an understanding of the instructions.   The patient was advised to call back or seek an in-person evaluation if the symptoms worsen or if the condition fails to improve as anticipated.  I provided 9 minutes of non-face-to-face time during this encounter.  The entirety of the information documented in the History of Present Illness, Review of Systems and Physical Exam were personally obtained by me. Portions of this information were initially documented by the CMA and reviewed by me for thoroughness and accuracy.    Lelon Huh, MD [facility] [phone] (phone) [phone] (fax)  [facility]

## 2022-02-27 ENCOUNTER — Encounter: Payer: Self-pay | Admitting: Urology

## 2022-02-27 ENCOUNTER — Ambulatory Visit: Payer: Medicare HMO | Admitting: Urology

## 2022-02-27 VITALS — BP 160/75 | HR 94 | Ht 74.0 in | Wt 235.0 lb

## 2022-02-27 DIAGNOSIS — R361 Hematospermia: Secondary | ICD-10-CM | POA: Diagnosis not present

## 2022-02-27 NOTE — Progress Notes (Signed)
   02/27/22 1:48 PM   Dustin Guerra 1955/06/18 [id number]  CC: Hematospermia  HPI: 66 year old male referred for hematospermia.  He was originally found to have hematospermia in September 2023 and was found to have a trichomonas infection and treated with antibiotics.  His hematospermia resolved, but he had a recurrent episode last week and was referred to urology.  Repeat STD testing was negative, urinalysis at time of trichomonas infection showed greater than 30 WBCs but 0 RBCs.  He denies any gross hematuria or urinary symptoms.  He had a CT scan of the abdomen pelvis in October 2022 that was benign.   PMH: Past Medical History:  Diagnosis Date   Anxiety    Arthritis    Charcot's joint    left ankle   Diabetes mellitus without complication (HCC)    GERD (gastroesophageal reflux disease)    Hypertension    Neuropathy    bilateral feet per pt   Plantar fasciitis 08/04/2015   PONV (postoperative nausea and vomiting)     Surgical History: Past Surgical History:  Procedure Laterality Date   BACK SURGERY     COLON SURGERY  2015   Colon resection due to diverticulosis   VEIN LIGATION AND STRIPPING     VEIN SURGERY     Vein stripping at age 45-25   VENTRAL HERNIA REPAIR N/A 11/22/2020   Procedure: OPEN VENTRAL [place];  Surgeon: [name], MD;  Location: [facility];  Service: General;  Laterality: N/A;    Family History: Family History  Problem Relation Age of Onset   Alcohol abuse Mother    Esophageal cancer Father    Heart failure Brother     Social History:  reports that he has quit smoking. He has never been exposed to tobacco smoke. He has quit using smokeless tobacco. He reports that he does not drink alcohol and does not use drugs.  Physical Exam: BP (!) 160/75   Pulse 94   Ht '6\' 2"'$  (1.88 m)   Wt 235 lb (106.6 kg)   BMI 30.17 kg/m    Constitutional:  Alert and oriented, No acute distress. Cardiovascular: No  clubbing, cyanosis, or edema. Respiratory: Normal respiratory effort, no increased work of breathing. GI: Abdomen is soft, nontender, nondistended, no abdominal masses GU: Circumcised phallus with patent meatus, no lesions, testicles 20 cc and descended bilaterally DRE: 40 g, smooth, no nodules or masses   Pertinent Imaging: I have personally viewed and interpreted the CT abdomen and pelvis from October 2022 with no urologic abnormalities.  Assessment & Plan:   66 year old male with hematospermia.  Reassurance was provided that this is typically a benign condition.  CT last year was benign, urinalysis with no evidence of microscopic hematuria, and he denies any gross hematuria or urinary symptoms.  DRE today benign, PSA normal at 0.6.  Follow-up with urology as needed.  If gross hematuria or microscopic hematuria consider workup with CT urogram and cystoscopy.   Nickolas Madrid, MD 02/27/2022  [facility] [address] [place], [place] [address] [phone]

## 2022-02-27 NOTE — Patient Instructions (Signed)
Hematospermia(blood in the semen) is almost always benign.  If you develop blood in the urine or things do not resolve over the next few months, please contact the urology clinic.  Your PSA blood test, prostate exam, and urinalysis were all normal with no evidence of abnormalities or blood in the urine.  You also had a normal CT scan in October 2022.

## 2022-02-28 ENCOUNTER — Other Ambulatory Visit: Payer: Self-pay | Admitting: Family Medicine

## 2022-02-28 DIAGNOSIS — G629 Polyneuropathy, unspecified: Secondary | ICD-10-CM

## 2022-02-28 MED ORDER — TRAMADOL HCL 50 MG PO TABS: 50.0000 mg | ORAL_TABLET | Freq: Three times a day (TID) | ORAL | 4 refills | Status: DC | PRN

## 2022-03-02 ENCOUNTER — Other Ambulatory Visit: Payer: Self-pay | Admitting: Family Medicine

## 2022-03-02 DIAGNOSIS — G629 Polyneuropathy, unspecified: Secondary | ICD-10-CM

## 2022-03-03 MED ORDER — HYDROCODONE-ACETAMINOPHEN 7.5-325 MG PO TABS: ORAL_TABLET | ORAL | 0 refills | Status: DC

## 2022-03-06 ENCOUNTER — Ambulatory Visit: Payer: Medicare HMO | Admitting: Gastroenterology

## 2022-03-06 ENCOUNTER — Encounter: Payer: Self-pay | Admitting: Gastroenterology

## 2022-03-06 ENCOUNTER — Other Ambulatory Visit: Payer: Self-pay

## 2022-03-06 VITALS — BP 178/97 | HR 87 | Temp 97.7°F | Ht 74.0 in | Wt 239.2 lb

## 2022-03-06 DIAGNOSIS — D509 Iron deficiency anemia, unspecified: Secondary | ICD-10-CM

## 2022-03-06 DIAGNOSIS — R1032 Left lower quadrant pain: Secondary | ICD-10-CM

## 2022-03-06 DIAGNOSIS — K5909 Other constipation: Secondary | ICD-10-CM | POA: Diagnosis not present

## 2022-03-06 DIAGNOSIS — G8929 Other chronic pain: Secondary | ICD-10-CM | POA: Diagnosis not present

## 2022-03-06 MED ORDER — CLENPIQ 10-3.5-12 MG-GM -GM/175ML PO SOLN: 175.0000 mL | Freq: Once | ORAL | 0 refills | Status: AC

## 2022-03-06 NOTE — Progress Notes (Signed)
Cephas Darby, MD [address]  [place]  [place], [place] [address]  Main: [phone]  Fax: [phone]    Gastroenterology Consultation  Referring Provider:     Birdie Sons, MD Primary Care Physician:  Birdie Sons, MD Primary Gastroenterologist:  Dr. Cephas Darby Reason for Consultation: Left lower quadrant pain, iron deficiency anemia        HPI:   Dustin Guerra is a 66 y.o. male referred by Dr. Birdie Sons, MD  for consultation & management of of lower quadrant pain and iron deficiency anemia.  Patient had history of perforated diverticulitis s/p resection in 2015.  He reports having intermittent constipation.  Lately, he has been experiencing left lower quadrant pain especially when he has irregular bowel movements.  He is also found to have mild iron deficiency anemia, responded to oral iron treatment.  He denies any rectal bleeding.  He denies any weight loss.  NSAIDs: None  Antiplts/Anticoagulants/Anti thrombotics: None  GI Procedures:  Colonoscopy 01/17/2015 revealed colocolonic anastomosis, otherwise normal colon  Past Medical History:  Diagnosis Date   Anxiety    Arthritis    Charcot's joint    left ankle   Diabetes mellitus without complication (HCC)    GERD (gastroesophageal reflux disease)    Hypertension    Neuropathy    bilateral feet per pt   Plantar fasciitis 08/04/2015   PONV (postoperative nausea and vomiting)     Past Surgical History:  Procedure Laterality Date   BACK SURGERY     COLON SURGERY  2015   Colon resection due to diverticulosis   VEIN LIGATION AND STRIPPING     VEIN SURGERY     Vein stripping at age 57-25   VENTRAL HERNIA REPAIR N/A 11/22/2020   Procedure: OPEN VENTRAL [place];  Surgeon: [name], MD;  Location: [facility];  Service: General;  Laterality: N/A;     Current Outpatient Medications:    amLODipine (NORVASC) 5 MG tablet, Take 1 tablet (5 mg total) by  mouth daily., Disp: 90 tablet, Rfl: 0   Ascorbic Acid (VITAMIN C) 1000 MG tablet, Take 1,000 mg by mouth daily., Disp: , Rfl:    Azelaic Acid 15 % FOAM, Apply topically., Disp: , Rfl:    Cholecalciferol (VITAMIN D3) 50 MCG (2000 UT) TABS, Take 1 tablet by mouth daily., Disp: , Rfl:    cilostazol (PLETAL) 50 MG tablet, Take 1 tablet (50 mg total) by mouth 2 (two) times daily., Disp: 180 tablet, Rfl: 4   diazepam (VALIUM) 10 MG tablet, TAKE ONE TABLET BY MOUTH DAILY AS NEEDED FOR ANXIETY, Disp: 30 tablet, Rfl: 4   diclofenac Sodium (VOLTAREN) 1 % GEL, Apply 2 g topically 4 (four) times daily., Disp: 50 g, Rfl: 3   EPINEPHrine 0.3 mg/0.3 mL IJ SOAJ injection, as needed. , Disp: , Rfl:    hydrochlorothiazide (HYDRODIURIL) 12.5 MG tablet, TAKE ONE TABLET BY MOUTH DAILY, Disp: 90 tablet, Rfl: 4   HYDROcodone-acetaminophen (NORCO) 7.5-325 MG tablet, 1/2-1 tablet up to four times a day as needed, Disp: 120 tablet, Rfl: 0   hydrOXYzine (VISTARIL) 25 MG capsule, Take 1 capsule (25 mg total) by mouth every 8 (eight) hours as needed., Disp: 15 capsule, Rfl: 0   lansoprazole (PREVACID) 30 MG capsule, TAKE ONE CAPSULE BY MOUTH DAILY AT 12 NOON, Disp: 90 capsule, Rfl: 4   magnesium oxide (MAG-OX) 400 MG tablet, Take 1 tablet by mouth daily., Disp: ,  Rfl:    meclizine (ANTIVERT) 25 MG tablet, Take 1 tablet (25 mg total) by mouth 3 (three) times daily as needed for dizziness., Disp: 30 tablet, Rfl: 0   meloxicam (MOBIC) 15 MG tablet, Take 1 tablet (15 mg total) by mouth daily as needed., Disp: 90 tablet, Rfl: 4   pravastatin (PRAVACHOL) 40 MG tablet, Take 1 tablet (40 mg total) by mouth daily., Disp: 90 tablet, Rfl: 1   pregabalin (LYRICA) 225 MG capsule, Take 1 capsule (225 mg total) by mouth 3 (three) times daily., Disp: 270 capsule, Rfl: 4   psyllium (METAMUCIL) 58.6 % powder, Take 1 packet by mouth 3 (three) times daily. Large spoonful in a cup of water per day, Disp: , Rfl:    selenium 200 MCG TABS tablet,  Take 200 mcg by mouth daily., Disp: , Rfl:    sildenafil (VIAGRA) 100 MG tablet, TAKE 1/2 TO 1 TABLET BY MOUTH DAILY AS NEEDED FOR ERECTILE DYSFUNCTION, Disp: 10 tablet, Rfl: 2   silver sulfADIAZINE (SILVADENE) 1 % cream, Apply 1 Application topically daily., Disp: 50 g, Rfl: 1   traMADol (ULTRAM) 50 MG tablet, Take 1 tablet (50 mg total) by mouth 3 (three) times daily as needed., Disp: 90 tablet, Rfl: 4   vitamin B-12 (CYANOCOBALAMIN) 500 MCG tablet, Take 500 mcg by mouth daily., Disp: , Rfl:    vitamin E 180 MG (400 UNITS) capsule, Take 400 Units by mouth daily., Disp: , Rfl:    Zinc Sulfate 220 (50 Zn) MG TABS, Take 1 tablet by mouth daily., Disp: , Rfl:    Family History  Problem Relation Age of Onset   Alcohol abuse Mother    Esophageal cancer Father    Heart failure Brother      Social History   Tobacco Use   Smoking status: Former    Passive exposure: Never   Smokeless tobacco: Former   Tobacco comments:    quit 2000, smoked about 1.5 packs for about 5 years  Vaping Use   Vaping Use: Never used  Substance Use Topics   Alcohol use: No   Drug use: No    Allergies as of 03/06/2022 - Review Complete 03/06/2022  Allergen Reaction Noted   Bee venom Itching and Swelling 06/03/2021   Ibuprofen  11/12/2020    Review of Systems:    All systems reviewed and negative except where noted in HPI.   Physical Exam:  BP (!) 178/97 (BP Location: Left Arm, Patient Position: Sitting, Cuff Size: Normal)   Pulse 87   Temp 97.7 F (36.5 C) (Oral)   Ht '6\' 2"'$  (1.88 m)   Wt 239 lb 4 oz (108.5 kg)   BMI 30.72 kg/m  No LMP for male patient.  General:   Alert,  Well-developed, well-nourished, pleasant and cooperative in NAD Head:  Normocephalic and atraumatic. Eyes:  Sclera clear, no icterus.   Conjunctiva pink. Ears:  Normal auditory acuity. Nose:  No deformity, discharge, or lesions. Mouth:  No deformity or lesions,oropharynx pink & moist. Neck:  Supple; no masses or  thyromegaly. Lungs:  Respirations even and unlabored.  Clear throughout to auscultation.   No wheezes, crackles, or rhonchi. No acute distress. Heart:  Regular rate and rhythm; no murmurs, clicks, rubs, or gallops. Abdomen:  Normal bowel sounds. Soft, non-tender and non-distended without masses, hepatosplenomegaly or hernias noted.  No guarding or rebound tenderness.   Rectal: Not performed Msk:  Symmetrical without gross deformities. Good, equal movement & strength bilaterally. Pulses:  Normal pulses  noted. Extremities:  No clubbing or edema.  No cyanosis. Neurologic:  Alert and oriented x3;  grossly normal neurologically. Skin:  Intact without significant lesions or rashes. No jaundice. Psych:  Alert and cooperative. Normal mood and affect.  Imaging Studies: Reviewed  Assessment and Plan:   Caydence Enck is a 66 y.o. male with history of perforated colonic diverticulitis s/p resection in 2015, chronic constipation, left lower quadrant pain and mild iron deficiency anemia  Iron deficiency anemia Recommend upper endoscopy and colonoscopy for further evaluation  Left lower quadrant pain with chronic constipation Discussed about regular bowel regimen, high-fiber diet MiraLAX daily   Follow up based on the above workup   Cephas Darby, MD

## 2022-03-07 ENCOUNTER — Encounter: Payer: Self-pay | Admitting: Gastroenterology

## 2022-03-13 ENCOUNTER — Other Ambulatory Visit: Payer: Self-pay | Admitting: Family Medicine

## 2022-03-13 DIAGNOSIS — I10 Essential (primary) hypertension: Secondary | ICD-10-CM

## 2022-03-17 ENCOUNTER — Other Ambulatory Visit: Payer: Self-pay | Admitting: Family Medicine

## 2022-03-18 NOTE — Telephone Encounter (Signed)
Requested Prescriptions  Pending Prescriptions Disp Refills   pravastatin (PRAVACHOL) 40 MG tablet [Pharmacy Med Name: PRAVASTATIN SODIUM 40 MG TAB] 90 tablet 0    Sig: TAKE ONE TABLET BY MOUTH DAILY     Cardiovascular:  Antilipid - Statins Failed - 03/17/2022  6:22 AM      Failed - Lipid Panel in normal range within the last 12 months    Cholesterol, Total  Date Value Ref Range Status  06/04/2021 112 100 - 199 mg/dL Final   Cholesterol  Date Value Ref Range Status  06/04/2012 85 0 - 200 mg/dL Final   Ldl Cholesterol, Calc  Date Value Ref Range Status  06/04/2012 39 0 - 100 mg/dL Final   LDL Chol Calc (NIH)  Date Value Ref Range Status  06/04/2021 60 0 - 99 mg/dL Final   HDL Cholesterol  Date Value Ref Range Status  06/04/2012 24 (L) 40 - 60 mg/dL Final   HDL  Date Value Ref Range Status  06/04/2021 39 (L) >39 mg/dL Final   Triglycerides  Date Value Ref Range Status  06/04/2021 61 0 - 149 mg/dL Final  06/04/2012 109 0 - 200 mg/dL Final         Passed - Patient is not pregnant      Passed - Valid encounter within last 12 months    Recent Outpatient Visits           3 weeks ago [place], Donald E, MD   1 month ago [place], Donald E, MD   2 months ago Allergic contact dermatitis due to plants, except food   [employer], [place], PA-C   3 months ago Pre-diabetes   [place], Kirstie Peri, MD   9 months ago Annual physical exam   [place], Kirstie Peri, MD       Future Appointments             In 3 months [place], Kirstie Peri, MD [facility], [place]

## 2022-03-19 ENCOUNTER — Encounter: Payer: Self-pay | Admitting: Gastroenterology

## 2022-03-20 ENCOUNTER — Other Ambulatory Visit: Payer: Self-pay

## 2022-03-20 ENCOUNTER — Encounter: Payer: Self-pay | Admitting: Anesthesiology

## 2022-03-20 ENCOUNTER — Encounter: Payer: Self-pay | Admitting: Gastroenterology

## 2022-03-20 ENCOUNTER — Encounter
Admission: RE | Disposition: A | Discharge status: Home / Self Care | Payer: Self-pay | Source: Home / Self Care | Attending: Gastroenterology

## 2022-03-20 ENCOUNTER — Ambulatory Visit
Admission: RE | Admit: 2022-03-20 | Discharge: 2022-03-20 | Disposition: A | Discharge status: Home / Self Care | Payer: PPO | Attending: Gastroenterology | Admitting: Gastroenterology

## 2022-03-20 DIAGNOSIS — Z538 Procedure and treatment not carried out for other reasons: Secondary | ICD-10-CM | POA: Insufficient documentation

## 2022-03-20 DIAGNOSIS — D509 Iron deficiency anemia, unspecified: Secondary | ICD-10-CM | POA: Diagnosis not present

## 2022-03-20 HISTORY — PX: COLONOSCOPY WITH PROPOFOL: SHX5780

## 2022-03-20 HISTORY — PX: ESOPHAGOGASTRODUODENOSCOPY (EGD) WITH PROPOFOL: SHX5813

## 2022-03-20 LAB — GLUCOSE, CAPILLARY: Glucose-Capillary: 153 mg/dL — ABNORMAL HIGH (ref 70–99)

## 2022-03-20 SURGERY — COLONOSCOPY WITH PROPOFOL
Anesthesia: General

## 2022-03-20 MED ORDER — SODIUM CHLORIDE 0.9 % IV SOLN: INTRAVENOUS | Status: DC

## 2022-03-20 MED ORDER — CLENPIQ 10-3.5-12 MG-GM -GM/175ML PO SOLN: 175.0000 mL | Freq: Once | ORAL | 0 refills | Status: AC

## 2022-03-20 MED ORDER — GOLYTELY 236 G PO SOLR: 4000.0000 mL | Freq: Once | ORAL | 0 refills | Status: AC

## 2022-03-20 MED ORDER — GOLYTELY 236 G PO SOLR: 4000.0000 mL | Freq: Once | ORAL | 0 refills | Status: DC

## 2022-03-20 MED ORDER — CLENPIQ 10-3.5-12 MG-GM -GM/175ML PO SOLN: 175.0000 mL | Freq: Once | ORAL | 0 refills | Status: DC

## 2022-03-20 NOTE — Anesthesia Preprocedure Evaluation (Deleted)
Anesthesia Evaluation  Patient identified by MRN, date of birth, ID band Patient awake    Reviewed: Allergy & Precautions, NPO status , Patient's Chart, lab work & pertinent test results  History of Anesthesia Complications (+) PONV and history of anesthetic complications  Airway Mallampati: IV   Neck ROM: Full    Dental  (+) Missing, Chipped   Pulmonary former smoker (quit 1998)   Pulmonary exam normal breath sounds clear to auscultation       Cardiovascular hypertension, + Peripheral Vascular Disease  Normal cardiovascular exam Rhythm:Regular Rate:Normal     Neuro/Psych  PSYCHIATRIC DISORDERS Anxiety      Neuromuscular disease (neuropathy)    GI/Hepatic ,GERD  ,,  Endo/Other  diabetes, Type 2  Obesity   Renal/GU negative Renal ROS     Musculoskeletal  (+) Arthritis ,    Abdominal   Peds  Hematology negative hematology ROS (+)   Anesthesia Other Findings   Reproductive/Obstetrics                             Anesthesia Physical Anesthesia Plan  ASA: 2  Anesthesia Plan: General   Post-op Pain Management:    Induction: Intravenous  PONV Risk Score and Plan: 3 and Propofol infusion, TIVA and Treatment may vary due to age or medical condition  Airway Management Planned: Natural Airway  Additional Equipment:   Intra-op Plan:   Post-operative Plan:   Informed Consent: I have reviewed the patients History and Physical, chart, labs and discussed the procedure including the risks, benefits and alternatives for the proposed anesthesia with the patient or authorized representative who has indicated his/her understanding and acceptance.       Plan Discussed with: CRNA  Anesthesia Plan Comments: (LMA/GETA backup discussed.  Patient consented for risks of anesthesia including but not limited to:  - adverse reactions to medications - damage to eyes, teeth, lips or other oral  mucosa - nerve damage due to positioning  - sore throat or hoarseness - damage to heart, brain, nerves, lungs, other parts of body or loss of life  Informed patient about role of CRNA in peri- and intra-operative care.  Patient voiced understanding.  Update 03/20/21: case postponed for incomplete prep.)        Anesthesia Quick Evaluation

## 2022-03-20 NOTE — Progress Notes (Signed)
No one near here has Clenpiq in stock change to [facility] and informed patient on directions

## 2022-03-20 NOTE — Progress Notes (Signed)
Per Dr Vicente Males, patient states after colon prep that the prep was inadequate today and he was to be rescheduled for tomorrow, Friday, January 5.  Dr Georgeann Oppenheim office to call in a new colon prep and complete today as well as stay on clear liquids.  Patient agrees with this plan.

## 2022-03-20 NOTE — Addendum Note (Signed)
Addended by: Ulyess Blossom L on: 03/20/2022 02:10 PM   Modules accepted: Orders

## 2022-03-21 ENCOUNTER — Ambulatory Visit: Payer: PPO | Admitting: Anesthesiology

## 2022-03-21 ENCOUNTER — Encounter: Payer: Self-pay | Admitting: Gastroenterology

## 2022-03-21 ENCOUNTER — Encounter
Admission: RE | Disposition: A | Discharge status: Home / Self Care | Payer: Self-pay | Source: Home / Self Care | Attending: Gastroenterology

## 2022-03-21 ENCOUNTER — Ambulatory Visit
Admission: RE | Admit: 2022-03-21 | Discharge: 2022-03-21 | Disposition: A | Discharge status: Home / Self Care | Payer: PPO | Attending: Gastroenterology | Admitting: Gastroenterology

## 2022-03-21 DIAGNOSIS — K5730 Diverticulosis of large intestine without perforation or abscess without bleeding: Secondary | ICD-10-CM | POA: Insufficient documentation

## 2022-03-21 DIAGNOSIS — D122 Benign neoplasm of ascending colon: Secondary | ICD-10-CM | POA: Insufficient documentation

## 2022-03-21 DIAGNOSIS — G4730 Sleep apnea, unspecified: Secondary | ICD-10-CM | POA: Diagnosis not present

## 2022-03-21 DIAGNOSIS — E1140 Type 2 diabetes mellitus with diabetic neuropathy, unspecified: Secondary | ICD-10-CM | POA: Insufficient documentation

## 2022-03-21 DIAGNOSIS — Z860101 Personal history of adenomatous and serrated colon polyps: Secondary | ICD-10-CM

## 2022-03-21 DIAGNOSIS — E1151 Type 2 diabetes mellitus with diabetic peripheral angiopathy without gangrene: Secondary | ICD-10-CM | POA: Insufficient documentation

## 2022-03-21 DIAGNOSIS — Z87891 Personal history of nicotine dependence: Secondary | ICD-10-CM | POA: Insufficient documentation

## 2022-03-21 DIAGNOSIS — D126 Benign neoplasm of colon, unspecified: Secondary | ICD-10-CM | POA: Diagnosis not present

## 2022-03-21 DIAGNOSIS — D121 Benign neoplasm of appendix: Secondary | ICD-10-CM | POA: Diagnosis not present

## 2022-03-21 DIAGNOSIS — F419 Anxiety disorder, unspecified: Secondary | ICD-10-CM | POA: Diagnosis not present

## 2022-03-21 DIAGNOSIS — K635 Polyp of colon: Secondary | ICD-10-CM | POA: Diagnosis not present

## 2022-03-21 DIAGNOSIS — Z980 Intestinal bypass and anastomosis status: Secondary | ICD-10-CM | POA: Insufficient documentation

## 2022-03-21 DIAGNOSIS — D509 Iron deficiency anemia, unspecified: Secondary | ICD-10-CM | POA: Insufficient documentation

## 2022-03-21 DIAGNOSIS — K219 Gastro-esophageal reflux disease without esophagitis: Secondary | ICD-10-CM | POA: Insufficient documentation

## 2022-03-21 DIAGNOSIS — I10 Essential (primary) hypertension: Secondary | ICD-10-CM | POA: Insufficient documentation

## 2022-03-21 DIAGNOSIS — Z86010 Personal history of colonic polyps: Secondary | ICD-10-CM

## 2022-03-21 HISTORY — PX: ESOPHAGOGASTRODUODENOSCOPY (EGD) WITH PROPOFOL: SHX5813

## 2022-03-21 HISTORY — PX: COLONOSCOPY WITH PROPOFOL: SHX5780

## 2022-03-21 SURGERY — COLONOSCOPY WITH PROPOFOL
Anesthesia: General

## 2022-03-21 MED ORDER — PROPOFOL 1000 MG/100ML IV EMUL
INTRAVENOUS | Status: AC
  Filled 2022-03-21: qty 100

## 2022-03-21 MED ORDER — PROPOFOL 10 MG/ML IV BOLUS
INTRAVENOUS | Status: DC | PRN
  Administered 2022-03-21: 100 mg via INTRAVENOUS

## 2022-03-21 MED ORDER — SODIUM CHLORIDE 0.9 % IV SOLN
INTRAVENOUS | Status: DC
  Administered 2022-03-21: 1000 mL via INTRAVENOUS

## 2022-03-21 MED ORDER — PROPOFOL 500 MG/50ML IV EMUL
INTRAVENOUS | Status: DC | PRN
  Administered 2022-03-21: 125 ug/kg/min via INTRAVENOUS

## 2022-03-21 MED ORDER — LIDOCAINE HCL (CARDIAC) PF 100 MG/5ML IV SOSY
PREFILLED_SYRINGE | INTRAVENOUS | Status: DC | PRN
  Administered 2022-03-21: 100 mg via INTRAVENOUS

## 2022-03-21 MED ORDER — SODIUM CHLORIDE (PF) 0.9 % IJ SOLN
INTRAMUSCULAR | Status: DC | PRN
  Administered 2022-03-21: 8 mL

## 2022-03-21 MED ORDER — STERILE WATER FOR IRRIGATION IR SOLN
Status: DC | PRN
  Administered 2022-03-21: 60 mL

## 2022-03-21 NOTE — Transfer of Care (Signed)
Immediate Anesthesia Transfer of Care Note  Patient: Dustin Guerra  Procedure(s) Performed: COLONOSCOPY WITH PROPOFOL ESOPHAGOGASTRODUODENOSCOPY (EGD) WITH PROPOFOL  Patient Location: PACU  Anesthesia Type:General  Level of Consciousness: drowsy  Airway & Oxygen Therapy: Patient Spontanous Breathing  Post-op Assessment: Report given to RN and Post -op Vital signs reviewed and stable  Post vital signs: stable  Last Vitals:  Vitals Value Taken Time  BP 115/73 03/21/22 1052  Temp    Pulse 64 03/21/22 1052  Resp 16 03/21/22 1052  SpO2 99 % 03/21/22 1052  Vitals shown include unvalidated device data.  Last Pain:  Vitals:   03/21/22 0954  TempSrc: Temporal  PainSc: 0-No pain         Complications: No notable events documented.

## 2022-03-21 NOTE — Anesthesia Preprocedure Evaluation (Signed)
Anesthesia Evaluation  Patient identified by MRN, date of birth, ID band Patient awake    Reviewed: Allergy & Precautions, NPO status , Patient's Chart, lab work & pertinent test results  History of Anesthesia Complications (+) PONV and history of anesthetic complications  Airway Mallampati: III  TM Distance: <3 FB Neck ROM: full    Dental  (+) Chipped, Poor Dentition, Missing   Pulmonary neg shortness of breath, sleep apnea , former smoker   Pulmonary exam normal        Cardiovascular Exercise Tolerance: Good hypertension, (-) angina + Peripheral Vascular Disease  Normal cardiovascular exam     Neuro/Psych  PSYCHIATRIC DISORDERS Anxiety     negative neurological ROS     GI/Hepatic Neg liver ROS,GERD  Controlled,,  Endo/Other  negative endocrine ROSdiabetes, Type 2    Renal/GU negative Renal ROS  negative genitourinary   Musculoskeletal   Abdominal   Peds  Hematology negative hematology ROS (+)   Anesthesia Other Findings Past Medical History: No date: Anxiety No date: Arthritis No date: Charcot's joint     Comment:  left ankle No date: Diabetes mellitus without complication (HCC) No date: GERD (gastroesophageal reflux disease) No date: Hypertension No date: Neuropathy     Comment:  bilateral feet per pt 08/04/2015: Plantar fasciitis No date: PONV (postoperative nausea and vomiting)  Past Surgical History: No date: BACK SURGERY 2015: COLON SURGERY     Comment:  Colon resection due to diverticulosis No date: HERNIA REPAIR No date: VEIN LIGATION AND STRIPPING No date: VEIN SURGERY     Comment:  Vein stripping at age 7-25 11/22/2020: VENTRAL HERNIA REPAIR; N/A     Comment:  Procedure: OPEN VENTRAL INCISIONAL HERNIA REPAIR WITH               MESH PATCH;  Surgeon: [name], MD;  Location: [facility]               ORS;  Service: General;  Laterality: N/A;  BMI    Body Mass Index: 30.11 kg/m       Reproductive/Obstetrics negative OB ROS                             Anesthesia Physical Anesthesia Plan  ASA: 3  Anesthesia Plan: General   Post-op Pain Management:    Induction: Intravenous  PONV Risk Score and Plan: Propofol infusion and TIVA  Airway Management Planned: Natural Airway and Nasal Cannula  Additional Equipment:   Intra-op Plan:   Post-operative Plan:   Informed Consent: I have reviewed the patients History and Physical, chart, labs and discussed the procedure including the risks, benefits and alternatives for the proposed anesthesia with the patient or authorized representative who has indicated his/her understanding and acceptance.     Dental Advisory Given  Plan Discussed with: Anesthesiologist, CRNA and Surgeon  Anesthesia Plan Comments: (Patient consented for risks of anesthesia including but not limited to:  - adverse reactions to medications - risk of airway placement if required - damage to eyes, teeth, lips or other oral mucosa - nerve damage due to positioning  - sore throat or hoarseness - Damage to heart, brain, nerves, lungs, other parts of body or loss of life  Patient voiced understanding.)       Anesthesia Quick Evaluation

## 2022-03-21 NOTE — Op Note (Signed)
[facility] Gastroenterology Patient Name: Burdette Forehand Procedure Date: 03/21/2022 9:06 AM MRN: [id number] Account #: [id number] Date of Birth: 1955-08-26 Admit Type: Outpatient Age: 67 Room: [facility] ENDO ROOM 4 Gender: Male Note Status: Finalized Instrument Name: Jasper Riling [id number] Procedure:             Colonoscopy Indications:           Iron deficiency anemia Providers:             Jonathon Bellows MD, MD Referring MD:          Jonathon Bellows MD, MD (Referring MD) Medicines:             Monitored Anesthesia Care Complications:         No immediate complications. Procedure:             Pre-Anesthesia Assessment:                        - Prior to the procedure, a History and Physical was                         performed, and patient medications, allergies and                         sensitivities were reviewed. The patient's tolerance                         of previous anesthesia was reviewed.                        - The risks and benefits of the procedure and the                         sedation options and risks were discussed with the                         patient. All questions were answered and informed                         consent was obtained.                        - ASA Grade Assessment: II - A patient with mild                         systemic disease.                        After obtaining informed consent, the colonoscope was                         passed under direct vision. Throughout the procedure,                         the patient's blood pressure, pulse, and oxygen                         saturations were monitored continuously. The                         Colonoscope was introduced through  the anus and                         advanced to the the cecum, identified by the                         appendiceal orifice. The colonoscopy was performed                         with ease. The patient tolerated the procedure well.                          The quality of the bowel preparation was excellent.                         The ileocecal valve, appendiceal orifice, and rectum                         were photographed. Findings:      The perianal and digital rectal examinations were normal.      There was evidence of a prior end-to-end colo-colonic anastomosis in the       sigmoid colon. This was patent and was characterized by healthy       appearing mucosa.      A 3 mm polyp was found in the ascending colon. The polyp was sessile.       The polyp was removed with a jumbo cold forceps. Resection and retrieval       were complete.      A 15 mm polyp was found in the ascending colon. The polyp was sessile.       Preparations were made for mucosal resection. Demarcation of the lesion       was performed during the procedure, with narrow band imaging to clearly       identify the boundaries of the lesion. Saline was injected to raise the       lesion. Snare mucosal resection was performed. Resection and retrieval       were complete. Resected tissue margins were examined and clear of polyp       tissue. To prevent bleeding after mucosal resection, two hemostatic       clips were successfully placed. There was no bleeding during, or at the       end, of the procedure.      The exam was otherwise without abnormality on direct and retroflexion       views.      A few [Opening] diverticula were found in the left colon. Impression:            - Patent end-to-end colo-colonic anastomosis,                         characterized by healthy appearing mucosa.                        - One 3 mm polyp in the ascending colon, removed with                         a jumbo cold forceps. Resected and retrieved.                        -  One 15 mm polyp in the ascending colon, removed with                         mucosal resection. Resected and retrieved. Clips were                         placed.                        - The examination was otherwise  normal on direct and                         retroflexion views.                        - Mucosal resection was performed. Resection and                         retrieval were complete. Recommendation:        - Discharge patient to home (with escort).                        - Resume previous diet.                        - Continue present medications.                        - Await pathology results.                        - Repeat colonoscopy for surveillance based on                         pathology results.                        - Return to GI office as previously scheduled. Procedure Code(s):     --- Professional ---                        [phone], Colonoscopy, flexible; with endoscopic mucosal                         resection                        45380, 42, Colonoscopy, flexible; with biopsy, single                         or multiple Diagnosis Code(s):     --- Professional ---                        Z98.0, Intestinal bypass and anastomosis status                        D12.2, Benign neoplasm of ascending colon                        D50.9, Iron deficiency anemia, unspecified CPT copyright 2022 American Medical Association. All rights reserved. The codes documented in this report are preliminary and upon coder review  may  be revised to meet current compliance requirements. Jonathon Bellows, MD Jonathon Bellows MD, MD 03/21/2022 10:50:56 AM This report has been signed electronically. Number of Addenda: 0 Note Initiated On: 03/21/2022 9:06 AM Scope Withdrawal Time: 0 hours 18 minutes 24 seconds  Total Procedure Duration: 0 hours 21 minutes 30 seconds  Estimated Blood Loss:  Estimated blood loss: none.      [facility]

## 2022-03-21 NOTE — H&P (Signed)
Jonathon Bellows, MD [address], [place], [place], [place], [address] [address], [place], [place], [place], [address] Phone: [phone]  Fax: [phone]  Primary Care Physician:  Birdie Sons, MD   Pre-Procedure History & Physical: HPI:  Dustin Guerra is a 67 y.o. male is here for an endoscopy and colonoscopy    Past Medical History:  Diagnosis Date   Anxiety    Arthritis    Charcot's joint    left ankle   Diabetes mellitus without complication (HCC)    GERD (gastroesophageal reflux disease)    Hypertension    Neuropathy    bilateral feet per pt   Plantar fasciitis 08/04/2015   PONV (postoperative nausea and vomiting)     Past Surgical History:  Procedure Laterality Date   BACK SURGERY     COLON SURGERY  2015   Colon resection due to diverticulosis   HERNIA REPAIR     VEIN LIGATION AND STRIPPING     VEIN SURGERY     Vein stripping at age 40-25   [place] N/A 11/22/2020   Procedure: [place];  Surgeon: [name], MD;  Location: [facility] ORS;  Service: General;  Laterality: N/A;    Prior to Admission medications   Medication Sig Start Date End Date Taking? Authorizing Provider  amLODipine (NORVASC) 5 MG tablet TAKE 1 TABLET BY MOUTH DAILY 03/13/22  Yes Birdie Sons, MD  Ascorbic Acid (VITAMIN C) 1000 MG tablet Take 1,000 mg by mouth daily.   Yes [provider]  Azelaic Acid 15 % FOAM Apply topically. 11/25/19  Yes [provider]  Cholecalciferol (VITAMIN D3) 50 MCG (2000 UT) TABS Take 1 tablet by mouth daily.   Yes [provider]  cilostazol (PLETAL) 50 MG tablet Take 1 tablet (50 mg total) by mouth 2 (two) times daily. 03/22/21  Yes Birdie Sons, MD  diazepam (VALIUM) 10 MG tablet TAKE ONE TABLET BY MOUTH DAILY AS NEEDED FOR ANXIETY 09/26/21  Yes Birdie Sons, MD  diclofenac Sodium (VOLTAREN) 1 % GEL Apply 2 g topically 4 (four) times daily. 01/03/20   Yes Chrismon, Vickki Muff, PA-C  hydrochlorothiazide (HYDRODIURIL) 12.5 MG tablet TAKE ONE TABLET BY MOUTH DAILY 08/26/21  Yes Birdie Sons, MD  HYDROcodone-acetaminophen [facility]) 7.5-325 MG tablet 1/2-1 tablet up to four times a day as needed 03/03/22  Yes Birdie Sons, MD  lansoprazole (PREVACID) 30 MG capsule TAKE ONE CAPSULE BY MOUTH DAILY AT 12 NOON 06/21/21  Yes Birdie Sons, MD  magnesium oxide (MAG-OX) 400 MG tablet Take 1 tablet by mouth daily.   Yes [provider]  meloxicam (MOBIC) 15 MG tablet Take 1 tablet (15 mg total) by mouth daily as needed. 04/08/21  Yes Birdie Sons, MD  pravastatin (PRAVACHOL) 40 MG tablet TAKE ONE TABLET BY MOUTH DAILY 03/18/22  Yes Birdie Sons, MD  pregabalin (LYRICA) 225 MG capsule Take 1 capsule (225 mg total) by mouth 3 (three) times daily. 03/22/21  Yes Birdie Sons, MD  psyllium (METAMUCIL) 58.6 % powder Take 1 packet by mouth 3 (three) times daily. Large spoonful in a cup of water per day   Yes [provider]  selenium 200 MCG TABS tablet Take 200 mcg by mouth daily.   Yes [provider]  silver sulfADIAZINE (SILVADENE) 1 % cream Apply 1 Application topically daily. 11/29/21  Yes Edrick Kins, DPM  traMADol (  ULTRAM) 50 MG tablet Take 1 tablet (50 mg total) by mouth 3 (three) times daily as needed. 02/28/22  Yes Birdie Sons, MD  vitamin B-12 (CYANOCOBALAMIN) 500 MCG tablet Take 500 mcg by mouth daily.   Yes [provider]  vitamin E 180 MG (400 UNITS) capsule Take 400 Units by mouth daily.   Yes [provider]  Zinc Sulfate 220 (50 Zn) MG TABS Take 1 tablet by mouth daily.   Yes [provider]  EPINEPHrine 0.3 mg/0.3 mL IJ SOAJ injection as needed.  08/05/12   [provider]  hydrOXYzine (VISTARIL) 25 MG capsule Take 1 capsule (25 mg total) by mouth every 8 (eight) hours as needed. Patient not taking: Reported on 03/21/2022 01/06/22   Birdie Sons, MD  meclizine  (ANTIVERT) 25 MG tablet Take 1 tablet (25 mg total) by mouth 3 (three) times daily as needed for dizziness. 03/12/20   Birdie Sons, MD  sildenafil (VIAGRA) 100 MG tablet TAKE 1/2 TO 1 TABLET BY MOUTH DAILY AS NEEDED FOR ERECTILE DYSFUNCTION 12/27/21   Birdie Sons, MD    Allergies as of 03/20/2022 - Review Complete 03/20/2022  Allergen Reaction Noted   Bee venom Itching and Swelling 06/03/2021   Ibuprofen  11/12/2020    Family History  Problem Relation Age of Onset   Alcohol abuse Mother    Esophageal cancer Father    Heart failure Brother     Social History   Socioeconomic History   Marital status: Divorced    Spouse name: Not on file   Number of children: Not on file   Years of education: Not on file   Highest education level: Not on file  Occupational History   Not on file  Tobacco Use   Smoking status: Former    Types: Cigarettes    Quit date: 1998    Years since quitting: 26.0    Passive exposure: Never   Smokeless tobacco: Former    Types: Chew    Quit date: 2015   Tobacco comments:    quit 2000, smoked about 1.5 packs for about 5 years  Vaping Use   Vaping Use: Never used  Substance and Sexual Activity   Alcohol use: No   Drug use: No   Sexual activity: Not on file  Other Topics Concern   Not on file  Social History Narrative   Not on file   Social Determinants of Health   Financial Resource Strain: Not on file  Food Insecurity: Not on file  Transportation Needs: Not on file  Physical Activity: Not on file  Stress: Not on file  Social Connections: Not on file  Intimate Partner Violence: Not on file    Review of Systems: See HPI, otherwise negative ROS  Physical Exam: BP (!) 146/86   Pulse 73   Temp (!) 97 F (36.1 C) (Temporal)   Resp 18   Ht '6\' 2"'$  (1.88 m)   Wt 106.4 kg   SpO2 98%   BMI 30.11 kg/m  General:   Alert,  pleasant and cooperative in NAD Head:  Normocephalic and atraumatic. Neck:  Supple; no masses or  thyromegaly. Lungs:  Clear throughout to auscultation, normal respiratory effort.    Heart:  +S1, +S2, Regular rate and rhythm, No edema. Abdomen:  Soft, nontender and nondistended. Normal bowel sounds, without guarding, and without rebound.   Neurologic:  Alert and  oriented x4;  grossly normal neurologically.  Impression/Plan: Dustin Guerra is here  for an endoscopy and colonoscopy  to be performed for  evaluation of iron deficiency anemia    Risks, benefits, limitations, and alternatives regarding endoscopy have been reviewed with the patient.  Questions have been answered.  All parties agreeable.   Jonathon Bellows, MD  03/21/2022, 10:00 AM

## 2022-03-21 NOTE — Op Note (Signed)
[facility] Gastroenterology Patient Name: Dustin Guerra Procedure Date: 03/21/2022 9:06 AM MRN: [id number] Account #: [id number] Date of Birth: 24-Dec-1955 Admit Type: Outpatient Age: 67 Room: [facility] ENDO ROOM 4 Gender: Male Note Status: Finalized Instrument Name: Upper Endoscope [id number] Procedure:             Upper GI endoscopy Indications:           Iron deficiency anemia Providers:             Jonathon Bellows MD, MD Referring MD:          Jonathon Bellows MD, MD (Referring MD) Medicines:             Monitored Anesthesia Care Complications:         No immediate complications. Procedure:             Pre-Anesthesia Assessment:                        - Prior to the procedure, a History and Physical was                         performed, and patient medications, allergies and                         sensitivities were reviewed. The patient's tolerance                         of previous anesthesia was reviewed.                        - The risks and benefits of the procedure and the                         sedation options and risks were discussed with the                         patient. All questions were answered and informed                         consent was obtained.                        - ASA Grade Assessment: II - A patient with mild                         systemic disease.                        After obtaining informed consent, the endoscope was                         passed under direct vision. Throughout the procedure,                         the patient's blood pressure, pulse, and oxygen                         saturations were monitored continuously. The Endoscope  was introduced through the mouth, and advanced to the                         third part of duodenum. The upper GI endoscopy was                         accomplished with ease. The patient tolerated the                         procedure well. Findings:      The esophagus  was normal.      The stomach was normal.      The examined duodenum was normal. Biopsies were taken with a cold       forceps for histology.      The cardia and gastric fundus were normal on retroflexion. Impression:            - Normal esophagus.                        - Normal stomach.                        - Normal examined duodenum. Biopsied. Recommendation:        - Await pathology results.                        - Perform a colonoscopy today. Procedure Code(s):     --- Professional ---                        [phone], Esophagogastroduodenoscopy, flexible,                         transoral; with biopsy, single or multiple Diagnosis Code(s):     --- Professional ---                        D50.9, Iron deficiency anemia, unspecified CPT copyright 2022 American Medical Association. All rights reserved. The codes documented in this report are preliminary and upon coder review may  be revised to meet current compliance requirements. Jonathon Bellows, MD Jonathon Bellows MD, MD 03/21/2022 10:25:32 AM This report has been signed electronically. Number of Addenda: 0 Note Initiated On: 03/21/2022 9:06 AM Estimated Blood Loss:  Estimated blood loss: none.      [facility]

## 2022-03-21 NOTE — Anesthesia Postprocedure Evaluation (Signed)
Anesthesia Post Note  Patient: Dustin Guerra  Procedure(s) Performed: COLONOSCOPY WITH PROPOFOL ESOPHAGOGASTRODUODENOSCOPY (EGD) WITH PROPOFOL  Patient location during evaluation: Endoscopy Anesthesia Type: General Level of consciousness: awake and alert Pain management: pain level controlled Vital Signs Assessment: post-procedure vital signs reviewed and stable Respiratory status: spontaneous breathing, nonlabored ventilation, respiratory function stable and patient connected to nasal cannula oxygen Cardiovascular status: blood pressure returned to baseline and stable Postop Assessment: no apparent nausea or vomiting Anesthetic complications: no   No notable events documented.   Last Vitals:  Vitals:   03/21/22 1111 03/21/22 1121  BP: 139/84 (!) 140/91  Pulse: 66 (!) 56  Resp: 14 11  Temp:    SpO2: 100% 98%    Last Pain:  Vitals:   03/21/22 1121  TempSrc:   PainSc: 0-No pain                 Precious Haws Nicollette Wilhelmi

## 2022-03-22 NOTE — Progress Notes (Signed)
Non-identified Voicemail.  No Message Left. 

## 2022-03-24 ENCOUNTER — Encounter: Payer: Self-pay | Admitting: Gastroenterology

## 2022-03-24 LAB — SURGICAL PATHOLOGY

## 2022-03-28 ENCOUNTER — Other Ambulatory Visit: Payer: Self-pay | Admitting: Family Medicine

## 2022-03-28 DIAGNOSIS — G629 Polyneuropathy, unspecified: Secondary | ICD-10-CM

## 2022-03-28 NOTE — Telephone Encounter (Signed)
Requested medication (s) are due for refill today -yes  Requested medication (s) are on the active medication list -yes  Future visit scheduled -yes  Last refill: 03/22/21 #270 4RF  Notes to clinic: non delegated Rx  Requested Prescriptions  Pending Prescriptions Disp Refills   pregabalin (LYRICA) 225 MG capsule [Pharmacy Med Name: PREGABALIN 225 MG CAPSULE] 270 capsule     Sig: TAKE ONE CAPSULE BY MOUTH THREE TIMES A DAY     Not Delegated - Neurology:  Anticonvulsants - Controlled - pregabalin Failed - 03/28/2022  9:24 AM      Failed - This refill cannot be delegated      Passed - Cr in normal range and within 360 days    Creat  Date Value Ref Range Status  11/20/2016 0.90 0.70 - 1.33 mg/dL Final    Comment:    For patients >37 years of age, the reference limit for Creatinine is approximately 13% higher for people identified as African-American. .    Creatinine, Ser  Date Value Ref Range Status  06/04/2021 1.05 0.76 - 1.27 mg/dL Final         Passed - Completed PHQ-2 or PHQ-9 in the last 360 days      Passed - Valid encounter within last 12 months    Recent Outpatient Visits           1 month ago [place], Kirstie Peri, MD   1 month ago [place], Donald E, MD   2 months ago Allergic contact dermatitis due to plants, except food   [facility] Mikey Kirschner, PA-C   3 months ago [place], Donald E, MD   9 months ago Annual physical exam   [facility] Birdie Sons, MD       Future Appointments             In 2 months [place], Kirstie Peri, MD [facility]               Requested Prescriptions  Pending Prescriptions Disp Refills   pregabalin (LYRICA) 225 MG capsule [Pharmacy Med Name: PREGABALIN 225 MG CAPSULE] 270 capsule     Sig: TAKE ONE CAPSULE BY MOUTH THREE TIMES A DAY     Not  Delegated - Neurology:  Anticonvulsants - Controlled - pregabalin Failed - 03/28/2022  9:24 AM      Failed - This refill cannot be delegated      Passed - Cr in normal range and within 360 days    Creat  Date Value Ref Range Status  11/20/2016 0.90 0.70 - 1.33 mg/dL Final    Comment:    For patients >40 years of age, the reference limit for Creatinine is approximately 13% higher for people identified as African-American. .    Creatinine, Ser  Date Value Ref Range Status  06/04/2021 1.05 0.76 - 1.27 mg/dL Final         Passed - Completed PHQ-2 or PHQ-9 in the last 360 days      Passed - Valid encounter within last 12 months    Recent Outpatient Visits           1 month ago [place], Kirstie Peri, MD   1 month ago [place], Donald E, MD   2 months ago Allergic contact dermatitis due to plants, except  food   [facility] Thedore Mins, [place], PA-C   3 months ago Pre-diabetes   [facility] Birdie Sons, MD   9 months ago Annual physical exam   [facility] Birdie Sons, MD       Future Appointments             In 2 months [place], Kirstie Peri, MD [facility], [place]

## 2022-04-01 ENCOUNTER — Other Ambulatory Visit: Payer: Self-pay | Admitting: Family Medicine

## 2022-04-02 NOTE — Telephone Encounter (Signed)
Please review. Last office visit 02/25/2022.  KP

## 2022-04-09 DIAGNOSIS — G4733 Obstructive sleep apnea (adult) (pediatric): Secondary | ICD-10-CM | POA: Diagnosis not present

## 2022-04-10 ENCOUNTER — Other Ambulatory Visit: Payer: Self-pay | Admitting: Family Medicine

## 2022-04-10 DIAGNOSIS — G629 Polyneuropathy, unspecified: Secondary | ICD-10-CM

## 2022-04-10 MED ORDER — HYDROCODONE-ACETAMINOPHEN 7.5-325 MG PO TABS: ORAL_TABLET | ORAL | 0 refills | Status: DC

## 2022-04-28 ENCOUNTER — Ambulatory Visit: Payer: Medicare HMO | Admitting: Gastroenterology

## 2022-05-06 ENCOUNTER — Other Ambulatory Visit: Payer: Self-pay | Admitting: Family Medicine

## 2022-05-06 DIAGNOSIS — G629 Polyneuropathy, unspecified: Secondary | ICD-10-CM

## 2022-05-07 ENCOUNTER — Telehealth: Payer: Self-pay | Admitting: Family Medicine

## 2022-05-07 NOTE — Telephone Encounter (Signed)
Called patient to schedule Medicare Annual Wellness Visit (AWV). Left message for patient to call back and schedule Medicare Annual Wellness Visit (AWV).  Last date of AWV: 06/05/2022  Please schedule an appointment at any time with [facility].  If any questions, please contact me at [phone].  Thank you ,  [place] Direct Dial: [phone]

## 2022-05-09 MED ORDER — HYDROCODONE-ACETAMINOPHEN 7.5-325 MG PO TABS: ORAL_TABLET | ORAL | 0 refills | Status: DC

## 2022-05-10 DIAGNOSIS — G4733 Obstructive sleep apnea (adult) (pediatric): Secondary | ICD-10-CM | POA: Diagnosis not present

## 2022-06-03 ENCOUNTER — Telehealth: Payer: Self-pay | Admitting: Family Medicine

## 2022-06-03 NOTE — Telephone Encounter (Signed)
Contacted Dustin Guerra to schedule their annual wellness visit. Appointment made for 06/10/2022.  [place] Direct Dial: [phone]

## 2022-06-08 DIAGNOSIS — G4733 Obstructive sleep apnea (adult) (pediatric): Secondary | ICD-10-CM | POA: Diagnosis not present

## 2022-06-10 ENCOUNTER — Ambulatory Visit (INDEPENDENT_AMBULATORY_CARE_PROVIDER_SITE_OTHER): Payer: PPO

## 2022-06-10 VITALS — Ht 74.0 in | Wt 235.0 lb

## 2022-06-10 DIAGNOSIS — Z0000 Encounter for general adult medical examination without abnormal findings: Secondary | ICD-10-CM | POA: Diagnosis not present

## 2022-06-10 NOTE — Progress Notes (Signed)
Subjective:   Dustin Guerra is a 67 y.o. male who presents for Medicare Annual/Subsequent preventive examination.  Review of Systems    Cardiac Risk Factors include: advanced age (>74men, >57 women);diabetes mellitus;dyslipidemia;hypertension;male gender;obesity (BMI >30kg/m2)    Objective:    Today's Vitals   06/10/22 1434  Weight: 235 lb (106.6 kg)  Height: 6\' 2"  (1.88 m)   Body mass index is 30.17 kg/m.     06/10/2022    2:50 PM 03/21/2022    9:52 AM 03/20/2022    9:41 AM 11/12/2020   10:01 AM 09/22/2016    3:58 PM 08/27/2016   12:03 PM 07/30/2016    2:05 PM  Advanced Directives  Does Patient Have a Medical Advance Directive? No No No No No No No  Would patient like information on creating a medical advance directive?   No - Patient declined        Current Medications (verified) Outpatient Encounter Medications as of 06/10/2022  Medication Sig   amLODipine (NORVASC) 5 MG tablet TAKE 1 TABLET BY MOUTH DAILY   Ascorbic Acid (VITAMIN C) 1000 MG tablet Take 1,000 mg by mouth daily.   Azelaic Acid 15 % FOAM Apply topically.   Cholecalciferol (VITAMIN D3) 50 MCG (2000 UT) TABS Take 1 tablet by mouth daily.   cilostazol (PLETAL) 50 MG tablet Take 1 tablet (50 mg total) by mouth 2 (two) times daily.   diazepam (VALIUM) 10 MG tablet TAKE 1 TABLET BY MOUTH DAILY AS NEEDED FOR ANXIETY   diclofenac Sodium (VOLTAREN) 1 % GEL Apply 2 g topically 4 (four) times daily.   EPINEPHrine 0.3 mg/0.3 mL IJ SOAJ injection as needed.    hydrochlorothiazide (HYDRODIURIL) 12.5 MG tablet TAKE ONE TABLET BY MOUTH DAILY   HYDROcodone-acetaminophen (NORCO) 7.5-325 MG tablet 1/2-1 tablet up to four times a day as needed   lansoprazole (PREVACID) 30 MG capsule TAKE ONE CAPSULE BY MOUTH DAILY AT 12 NOON   magnesium oxide (MAG-OX) 400 MG tablet Take 1 tablet by mouth daily.   meclizine (ANTIVERT) 25 MG tablet Take 1 tablet (25 mg total) by mouth 3 (three) times daily as needed for dizziness.    meloxicam (MOBIC) 15 MG tablet Take 1 tablet (15 mg total) by mouth daily as needed.   pravastatin (PRAVACHOL) 40 MG tablet TAKE ONE TABLET BY MOUTH DAILY   pregabalin (LYRICA) 225 MG capsule TAKE ONE CAPSULE BY MOUTH THREE TIMES A DAY   psyllium (METAMUCIL) 58.6 % powder Take 1 packet by mouth 3 (three) times daily. Large spoonful in a cup of water per day   selenium 200 MCG TABS tablet Take 200 mcg by mouth daily.   sildenafil (VIAGRA) 100 MG tablet TAKE 1/2 TO 1 TABLET BY MOUTH DAILY AS NEEDED FOR ERECTILE DYSFUNCTION   silver sulfADIAZINE (SILVADENE) 1 % cream Apply 1 Application topically daily.   traMADol (ULTRAM) 50 MG tablet Take 1 tablet (50 mg total) by mouth 3 (three) times daily as needed.   vitamin B-12 (CYANOCOBALAMIN) 500 MCG tablet Take 500 mcg by mouth daily.   vitamin E 180 MG (400 UNITS) capsule Take 400 Units by mouth daily.   Zinc Sulfate 220 (50 Zn) MG TABS Take 1 tablet by mouth daily.   hydrOXYzine (VISTARIL) 25 MG capsule Take 1 capsule (25 mg total) by mouth every 8 (eight) hours as needed. (Patient not taking: Reported on 03/21/2022)   No facility-administered encounter medications on file as of 06/10/2022.    Allergies (verified) Bee venom and  Ibuprofen   History: Past Medical History:  Diagnosis Date   Anxiety    Arthritis    Charcot's joint    left ankle   Diabetes mellitus without complication (HCC)    GERD (gastroesophageal reflux disease)    Hypertension    Neuropathy    bilateral feet per pt   Plantar fasciitis 08/04/2015   PONV (postoperative nausea and vomiting)    Sleep apnea    Past Surgical History:  Procedure Laterality Date   BACK SURGERY     COLON SURGERY  2015   Colon resection due to diverticulosis   COLONOSCOPY WITH PROPOFOL N/A 03/20/2022   Procedure: COLONOSCOPY WITH PROPOFOL;  Surgeon: Jonathon Bellows, MD;  Location: [facility] ENDOSCOPY;  Service: Gastroenterology;  Laterality: N/A;   COLONOSCOPY WITH PROPOFOL N/A 03/21/2022   Procedure:  COLONOSCOPY WITH PROPOFOL;  Surgeon: Jonathon Bellows, MD;  Location: [facility] ENDOSCOPY;  Service: Gastroenterology;  Laterality: N/A;   ESOPHAGOGASTRODUODENOSCOPY (EGD) WITH PROPOFOL N/A 03/20/2022   Procedure: ESOPHAGOGASTRODUODENOSCOPY (EGD) WITH PROPOFOL;  Surgeon: Jonathon Bellows, MD;  Location: [facility] ENDOSCOPY;  Service: Gastroenterology;  Laterality: N/A;   ESOPHAGOGASTRODUODENOSCOPY (EGD) WITH PROPOFOL N/A 03/21/2022   Procedure: ESOPHAGOGASTRODUODENOSCOPY (EGD) WITH PROPOFOL;  Surgeon: Jonathon Bellows, MD;  Location: [facility] ENDOSCOPY;  Service: Gastroenterology;  Laterality: N/A;   HERNIA REPAIR     VEIN LIGATION AND STRIPPING     VEIN SURGERY     Vein stripping at age 56-25   VENTRAL HERNIA REPAIR N/A 11/22/2020   Procedure: OPEN VENTRAL [place];  Surgeon: [name], MD;  Location: [facility] ORS;  Service: General;  Laterality: N/A;   Family History  Problem Relation Age of Onset   Alcohol abuse Mother    Esophageal cancer Father    Cancer Father    Heart failure Brother    Social History   Socioeconomic History   Marital status: Divorced    Spouse name: Not on file   Number of children: Not on file   Years of education: Not on file   Highest education level: Not on file  Occupational History   Not on file  Tobacco Use   Smoking status: Former    Packs/day: 1.50    Years: 10.00    Additional pack years: 0.00    Total pack years: 15.00    Types: Cigarettes    Quit date: 08/15/1998    Years since quitting: 23.8    Passive exposure: Never   Smokeless tobacco: Former    Types: Chew    Quit date: 2015   Tobacco comments:    quit 2000, smoked about 1.5 packs for about 5 years  Vaping Use   Vaping Use: Never used  Substance and Sexual Activity   Alcohol use: Yes    Alcohol/week: 10.0 standard drinks of alcohol    Types: 10 Glasses of wine per week    Comment: Red wine before bed   Drug use: No   Sexual activity: Not Currently    Birth  control/protection: Condom  Other Topics Concern   Not on file  Social History Narrative   Not on file   Social Determinants of Health   Financial Resource Strain: Low Risk  (06/06/2022)   Overall Financial Resource Strain (CARDIA)    Difficulty of Paying Living Expenses: Not very hard  Food Insecurity: No Food Insecurity (06/06/2022)   Hunger Vital Sign    Worried About Running Out of Food in the Last Year: Never true    Ran  Out of Food in the Last Year: Never true  Transportation Needs: No Transportation Needs (06/06/2022)   PRAPARE - Hydrologist (Medical): No    Lack of Transportation (Non-Medical): No  Physical Activity: Sufficiently Active (06/06/2022)   Exercise Vital Sign    Days of Exercise per Week: 6 days    Minutes of Exercise per Session: 90 min  Stress: No Stress Concern Present (06/06/2022)   [place]    Feeling of Stress : Only a little  Social Connections: Unknown (06/06/2022)   Social Connection and Isolation Panel [NHANES]    Frequency of Communication with Friends and Family: More than three times a week    Frequency of Social Gatherings with Friends and Family: Three times a week    Attends Religious Services: Not on file    Active Member of Clubs or Organizations: No    Attends Archivist Meetings: Never    Marital Status: Divorced    Tobacco Counseling Counseling given: Not Answered Tobacco comments: quit 2000, smoked about 1.5 packs for about 5 years   Clinical Intake:  Pre-visit preparation completed: Yes  Pain : No/denies pain   BMI - recorded: 30.17 Nutritional Status: BMI > 30  Obese Nutritional Risks: None Diabetes: No  How often do you need to have someone help you when you read instructions, pamphlets, or other written materials from your doctor or pharmacy?: 1 - Never  Diabetic?yes  Interpreter Needed?: No  Information entered by  :: B.Desha Bitner,LPN   Activities of Daily Living    06/06/2022   12:31 PM  In your present state of health, do you have any difficulty performing the following activities:  Hearing? 0  Vision? 1  Difficulty concentrating or making decisions? 0  Walking or climbing stairs? 0  Dressing or bathing? 0  Doing errands, shopping? 0  Preparing Food and eating ? N  Using the Toilet? N  In the past six months, have you accidently leaked urine? N  Do you have problems with loss of bowel control? N  Managing your Medications? N  Managing your Finances? N  Housekeeping or managing your Housekeeping? N    Patient Care Team: Birdie Sons, MD as PCP - General (Family Medicine) Schnier, Dolores Lory, MD (Vascular Surgery) Edrick Kins, DPM as Consulting Physician (Podiatry)  Indicate any recent Medical Services you may have received from other than [facility] providers in the past year (date may be approximate).     Assessment:   This is a routine wellness examination for Johnas.  Hearing/Vision screen Hearing Screening - Comments:: Adequate hearing:little loss.no need for testing yet Vision Screening - Comments:: Adequate vision w/glasses/readers Dr Gloriann Loan  Dietary issues and exercise activities discussed: Current Exercise Habits: Home exercise routine;Structured exercise class, Type of exercise: walking, Time (Minutes): > 60, Frequency (Times/Week): 6, Weekly Exercise (Minutes/Week): 0, Intensity: Moderate, Exercise limited by: None identified   Goals Addressed   None    Depression Screen    06/10/2022    2:40 PM 12/13/2021   10:15 AM 06/04/2021   11:00 AM 08/31/2020    9:50 AM 03/12/2020    9:59 AM 09/05/2019    9:15 AM 05/14/2018    9:13 AM  PHQ 2/9 Scores  PHQ - 2 Score 0 0 0 0 0 0 0  PHQ- 9 Score  0 1 1 0 0     Fall Risk    06/06/2022  12:31 PM 06/04/2021   11:01 AM 08/31/2020    9:49 AM 03/12/2020   10:06 AM 09/05/2019    9:10 AM  Fall Risk   Falls in the past year? 0 0 0 0  0  Number falls in past yr:  0 0 0 0  Injury with Fall?  0 0 0 0  Risk for fall due to :  No Fall Risks No Fall Risks No Fall Risks   Follow up  Falls evaluation completed Falls evaluation completed Falls evaluation completed     [place]:  Any stairs in or around the home? Yes  If so, are there any without handrails? Yes  Home free of loose throw rugs in walkways, pet beds, electrical cords, etc? Yes  Adequate lighting in your home to reduce risk of falls? Yes   ASSISTIVE DEVICES UTILIZED TO PREVENT FALLS:  Life alert? No  Use of a cane, walker or w/c? No  Grab bars in the bathroom? Yes  Shower chair or bench in shower? No  Elevated toilet seat or a handicapped toilet? Yes   Cognitive Function:        06/10/2022    2:43 PM  6CIT Screen  What Year? 0 points  What month? 0 points  What time? 0 points  Count back from 20 0 points  Months in reverse 0 points  Repeat phrase 0 points  Total Score 0 points    Immunizations Immunization History  Administered Date(s) Administered   Tdap 08/27/2016   Zoster Recombinat (Shingrix) 12/09/2017, 03/26/2018    TDAP status: Up to date  Flu Vaccine status: Declined, Education has been provided regarding the importance of this vaccine but patient still declined. Advised may receive this vaccine at local pharmacy or Health Dept. Aware to provide a copy of the vaccination record if obtained from local pharmacy or Health Dept. Verbalized acceptance and understanding.  Pneumococcal vaccine status: Declined,  Education has been provided regarding the importance of this vaccine but patient still declined. Advised may receive this vaccine at local pharmacy or Health Dept. Aware to provide a copy of the vaccination record if obtained from local pharmacy or Health Dept. Verbalized acceptance and understanding.   Covid-19 vaccine status: Declined, Education has been provided regarding the importance of this  vaccine but patient still declined. Advised may receive this vaccine at local pharmacy or Health Dept.or vaccine clinic. Aware to provide a copy of the vaccination record if obtained from local pharmacy or Health Dept. Verbalized acceptance and understanding.  Qualifies for Shingles Vaccine? Yes   Zostavax completed Yes   Shingrix Completed?: Yes  Screening Tests Health Maintenance  Topic Date Due   COVID-19 Vaccine (1) Never done   OPHTHALMOLOGY EXAM  Never done   Diabetic kidney evaluation - Urine ACR  11/20/2017   Diabetic kidney evaluation - eGFR measurement  06/05/2022   INFLUENZA VACCINE  06/15/2022 (Originally 10/15/2021)   HEMOGLOBIN A1C  06/13/2022   Medicare Annual Wellness (AWV)  06/10/2023   COLONOSCOPY (Pts 45-73yrs Insurance coverage will need to be confirmed)  03/21/2025   DTaP/Tdap/Td (2 - Td or Tdap) 08/28/2026   Hepatitis C Screening  Completed   HIV Screening  Completed   Zoster Vaccines- Shingrix  Completed   HPV VACCINES  Aged Out    Health Maintenance  Health Maintenance Due  Topic Date Due   COVID-19 Vaccine (1) Never done   OPHTHALMOLOGY EXAM  Never done   Diabetic kidney evaluation -  Urine ACR  11/20/2017   Diabetic kidney evaluation - eGFR measurement  06/05/2022    Colorectal cancer screening: Type of screening: Colonoscopy. Completed yes. Repeat every 3 years last in Jan 2024  Lung Cancer Screening: (Low Dose CT Chest recommended if Age 18-80 years, 30 pack-year currently smoking OR have quit w/in 15years.) does not qualify.   Lung Cancer Screening Referral: no  Additional Screening:  Hepatitis C Screening: does not qualify; Completed yes  Vision Screening: Recommended annual ophthalmology exams for early detection of glaucoma and other disorders of the eye. Is the patient up to date with their annual eye exam?  No  Who is the provider or what is the name of the office in which the patient attends annual eye exams? Dr Gloriann Loan (pt will make  appt) If pt is not established with a provider, would they like to be referred to a provider to establish care? No .   Dental Screening: Recommended annual dental exams for proper oral hygiene  Community Resource Referral / Chronic Care Management: CRR required this visit?  No   CCM required this visit?  No      Plan:     I have personally reviewed and noted the following in the patient's chart:   Medical and social history Use of alcohol, tobacco or illicit drugs  Current medications and supplements including opioid prescriptions. Patient is not currently taking opioid prescriptions. Functional ability and status Nutritional status Physical activity Advanced directives List of other physicians Hospitalizations, surgeries, and ER visits in previous 12 months Vitals Screenings to include cognitive, depression, and falls Referrals and appointments  In addition, I have reviewed and discussed with patient certain preventive protocols, quality metrics, and best practice recommendations. A written personalized care plan for preventive services as well as general preventive health recommendations were provided to patient.     Roger Shelter, LPN   [id number]   Nurse Notes: pt is doing well. He voices no concerns or questions during the visit.

## 2022-06-10 NOTE — Patient Instructions (Signed)
Dustin Guerra , Thank you for taking time to come for your Medicare Wellness Visit. I appreciate your ongoing commitment to your health goals. Please review the following plan we discussed and let me know if I can assist you in the future.   These are the goals we discussed:  Goals   None     This is a list of the screening recommended for you and due dates:  Health Maintenance  Topic Date Due   COVID-19 Vaccine (1) Never done   Eye exam for diabetics  Never done   Yearly kidney health urinalysis for diabetes  11/20/2017   Yearly kidney function blood test for diabetes  06/05/2022   Flu Shot  06/15/2022*   Hemoglobin A1C  06/13/2022   Medicare Annual Wellness Visit  06/10/2023   Colon Cancer Screening  03/21/2025   DTaP/Tdap/Td vaccine (2 - Td or Tdap) 08/28/2026   Hepatitis C Screening: USPSTF Recommendation to screen - Ages 10-79 yo.  Completed   HIV Screening  Completed   Zoster (Shingles) Vaccine  Completed   HPV Vaccine  Aged Out  *Topic was postponed. The date shown is not the original due date.    Advanced directives: no  Conditions/risks identified: none  Next appointment: Follow up in one year for your annual wellness visit 06/15/2023 @2pm  telephone  Preventive Care 40-64 Years, Male Preventive care refers to lifestyle choices and visits with your health care provider that can promote health and wellness. What does preventive care include? A yearly physical exam. This is also called an annual well check. Dental exams once or twice a year. Routine eye exams. Ask your health care provider how often you should have your eyes checked. Personal lifestyle choices, including: Daily care of your teeth and gums. Regular physical activity. Eating a healthy diet. Avoiding tobacco and drug use. Limiting alcohol use. Practicing safe sex. Taking low-dose aspirin every day starting at age 30. What happens during an annual well check? The services and screenings done by your  health care provider during your annual well check will depend on your age, overall health, lifestyle risk factors, and family history of disease. Counseling  Your health care provider may ask you questions about your: Alcohol use. Tobacco use. Drug use. Emotional well-being. Home and relationship well-being. Sexual activity. Eating habits. Work and work Statistician. Screening  You may have the following tests or measurements: Height, weight, and BMI. Blood pressure. Lipid and cholesterol levels. These may be checked every 5 years, or more frequently if you are over 64 years old. Skin check. Lung cancer screening. You may have this screening every year starting at age 63 if you have a 30-pack-year history of smoking and currently smoke or have quit within the past 15 years. Fecal occult blood test (FOBT) of the stool. You may have this test every year starting at age 108. Flexible sigmoidoscopy or colonoscopy. You may have a sigmoidoscopy every 5 years or a colonoscopy every 10 years starting at age 43. Prostate cancer screening. Recommendations will vary depending on your family history and other risks. Hepatitis C blood test. Hepatitis B blood test. Sexually transmitted disease (STD) testing. Diabetes screening. This is done by checking your blood sugar (glucose) after you have not eaten for a while (fasting). You may have this done every 1-3 years. Discuss your test results, treatment options, and if necessary, the need for more tests with your health care provider. Vaccines  Your health care provider may recommend certain vaccines, such as:  Influenza vaccine. This is recommended every year. Tetanus, diphtheria, and acellular pertussis (Tdap, Td) vaccine. You may need a Td booster every 10 years. Zoster vaccine. You may need this after age 64. Pneumococcal 13-valent conjugate (PCV13) vaccine. You may need this if you have certain conditions and have not been vaccinated. Pneumococcal  polysaccharide (PPSV23) vaccine. You may need one or two doses if you smoke cigarettes or if you have certain conditions. Talk to your health care provider about which screenings and vaccines you need and how often you need them. This information is not intended to replace advice given to you by your health care provider. Make sure you discuss any questions you have with your health care provider. Document Released: 03/30/2015 Document Revised: 11/21/2015 Document Reviewed: 01/02/2015 Elsevier Interactive Patient Education  2017 [place] Prevention in the Home Falls can cause injuries. They can happen to people of all ages. There are many things you can do to make your home safe and to help prevent falls. What can I do on the outside of my home? Regularly fix the edges of walkways and driveways and fix any cracks. Remove anything that might make you trip as you walk through a door, such as a raised step or threshold. Trim any bushes or trees on the path to your home. Use bright outdoor lighting. Clear any walking paths of anything that might make someone trip, such as rocks or tools. Regularly check to see if handrails are loose or broken. Make sure that both sides of any steps have handrails. Any raised decks and porches should have guardrails on the edges. Have any leaves, snow, or ice cleared regularly. Use sand or salt on walking paths during winter. Clean up any spills in your garage right away. This includes oil or grease spills. What can I do in the bathroom? Use night lights. Install grab bars by the toilet and in the tub and shower. Do not use towel bars as grab bars. Use non-skid mats or decals in the tub or shower. If you need to sit down in the shower, use a plastic, non-slip stool. Keep the floor dry. Clean up any water that spills on the floor as soon as it happens. Remove soap buildup in the tub or shower regularly. Attach bath mats securely with double-sided  non-slip rug tape. Do not have throw rugs and other things on the floor that can make you trip. What can I do in the bedroom? Use night lights. Make sure that you have a light by your bed that is easy to reach. Do not use any sheets or blankets that are too big for your bed. They should not hang down onto the floor. Have a firm chair that has side arms. You can use this for support while you get dressed. Do not have throw rugs and other things on the floor that can make you trip. What can I do in the kitchen? Clean up any spills right away. Avoid walking on wet floors. Keep items that you use a lot in easy-to-reach places. If you need to reach something above you, use a strong step stool that has a grab bar. Keep electrical cords out of the way. Do not use floor polish or wax that makes floors slippery. If you must use wax, use non-skid floor wax. Do not have throw rugs and other things on the floor that can make you trip. What can I do with my stairs? Do not leave any items on  the stairs. Make sure that there are handrails on both sides of the stairs and use them. Fix handrails that are broken or loose. Make sure that handrails are as long as the stairways. Check any carpeting to make sure that it is firmly attached to the stairs. Fix any carpet that is loose or worn. Avoid having throw rugs at the top or bottom of the stairs. If you do have throw rugs, attach them to the floor with carpet tape. Make sure that you have a light switch at the top of the stairs and the bottom of the stairs. If you do not have them, ask someone to add them for you. What else can I do to help prevent falls? Wear shoes that: Do not have high heels. Have rubber bottoms. Are comfortable and fit you well. Are closed at the toe. Do not wear sandals. If you use a stepladder: Make sure that it is fully opened. Do not climb a closed stepladder. Make sure that both sides of the stepladder are locked into place. Ask  someone to hold it for you, if possible. Clearly mark and make sure that you can see: Any grab bars or handrails. First and last steps. Where the edge of each step is. Use tools that help you move around (mobility aids) if they are needed. These include: Canes. Walkers. Scooters. Crutches. Turn on the lights when you go into a dark area. Replace any light bulbs as soon as they burn out. Set up your furniture so you have a clear path. Avoid moving your furniture around. If any of your floors are uneven, fix them. If there are any pets around you, be aware of where they are. Review your medicines with your doctor. Some medicines can make you feel dizzy. This can increase your chance of falling. Ask your doctor what other things that you can do to help prevent falls. This information is not intended to replace advice given to you by your health care provider. Make sure you discuss any questions you have with your health care provider. Document Released: 12/28/2008 Document Revised: 08/09/2015 Document Reviewed: 04/07/2014 Elsevier Interactive Patient Education  2017 [employer].

## 2022-06-12 ENCOUNTER — Other Ambulatory Visit: Payer: Self-pay | Admitting: Family Medicine

## 2022-06-12 DIAGNOSIS — I739 Peripheral vascular disease, unspecified: Secondary | ICD-10-CM

## 2022-06-13 ENCOUNTER — Other Ambulatory Visit: Payer: Self-pay | Admitting: Family Medicine

## 2022-06-13 DIAGNOSIS — I700 Atherosclerosis of aorta: Secondary | ICD-10-CM

## 2022-06-13 MED ORDER — PRAVASTATIN SODIUM 40 MG PO TABS: 40.0000 mg | ORAL_TABLET | Freq: Every day | ORAL | 0 refills | Status: DC

## 2022-06-18 ENCOUNTER — Other Ambulatory Visit: Payer: Self-pay | Admitting: Family Medicine

## 2022-06-18 DIAGNOSIS — G629 Polyneuropathy, unspecified: Secondary | ICD-10-CM

## 2022-06-18 NOTE — Telephone Encounter (Unsigned)
Copied from [place] [id number]. Topic: General - Other >> Jun 18, 2022 11:57 AM Everette C wrote: Reason for CRM: Medication Refill - Medication: HYDROcodone-acetaminophen ([place]) 7.5-325 MG tablet [id number]  Has the patient contacted their pharmacy? No. (Agent: If no, request that the patient contact the pharmacy for the refill. If patient does not wish to contact the pharmacy document the reason why and proceed with request.) (Agent: If yes, when and what did the pharmacy advise?)  Preferred Pharmacy (with phone number or street name): [facility] [id number], [place] [address] Phone: [phone] Fax: [phone] Hours: Not open 24 hours   Has the patient been seen for an appointment in the last year OR does the patient have an upcoming appointment? Yes.    Agent: Please be advised that RX refills may take up to 3 business days. We ask that you follow-up with your pharmacy.

## 2022-06-18 NOTE — Telephone Encounter (Signed)
Requested medication (s) are due for refill today: Yes  Requested medication (s) are on the active medication list: yes    Last refill: 05/09/22  #120  0 refills  Future visit scheduled Yes 06/23/22  Notes to clinic:Not delegated, please review. Thank you.  Requested Prescriptions  Pending Prescriptions Disp Refills   HYDROcodone-acetaminophen (NORCO) 7.5-325 MG tablet 120 tablet 0    Sig: 1/2-1 tablet up to four times a day as needed     Not Delegated - Analgesics:  Opioid Agonist Combinations Failed - 06/18/2022 12:21 PM      Failed - This refill cannot be delegated      Failed - Urine Drug Screen completed in last 360 days      Passed - Valid encounter within last 3 months    Recent Outpatient Visits           3 months ago McIntosh, Donald E, MD   4 months ago [place], Donald E, MD   5 months ago Allergic contact dermatitis due to plants, except food   [facility] Mikey Kirschner, PA-C   6 months ago Hewitt, Donald E, MD   1 year ago Annual physical exam   [facility] Birdie Sons, MD       Future Appointments             In 5 days Fisher, Kirstie Peri, MD [facility] [place], [place]

## 2022-06-19 MED ORDER — HYDROCODONE-ACETAMINOPHEN 7.5-325 MG PO TABS: ORAL_TABLET | ORAL | 0 refills | Status: DC

## 2022-06-23 ENCOUNTER — Ambulatory Visit (INDEPENDENT_AMBULATORY_CARE_PROVIDER_SITE_OTHER): Payer: PPO | Admitting: Family Medicine

## 2022-06-23 VITALS — BP 158/87 | HR 66 | Temp 98.1°F | Ht 74.0 in | Wt 244.0 lb

## 2022-06-23 DIAGNOSIS — D649 Anemia, unspecified: Secondary | ICD-10-CM | POA: Diagnosis not present

## 2022-06-23 DIAGNOSIS — Z0000 Encounter for general adult medical examination without abnormal findings: Secondary | ICD-10-CM | POA: Diagnosis not present

## 2022-06-23 DIAGNOSIS — I700 Atherosclerosis of aorta: Secondary | ICD-10-CM | POA: Diagnosis not present

## 2022-06-23 DIAGNOSIS — I739 Peripheral vascular disease, unspecified: Secondary | ICD-10-CM | POA: Diagnosis not present

## 2022-06-23 DIAGNOSIS — G609 Hereditary and idiopathic neuropathy, unspecified: Secondary | ICD-10-CM | POA: Diagnosis not present

## 2022-06-23 DIAGNOSIS — R7303 Prediabetes: Secondary | ICD-10-CM | POA: Diagnosis not present

## 2022-06-23 DIAGNOSIS — I10 Essential (primary) hypertension: Secondary | ICD-10-CM | POA: Diagnosis not present

## 2022-06-23 DIAGNOSIS — D539 Nutritional anemia, unspecified: Secondary | ICD-10-CM | POA: Diagnosis not present

## 2022-06-23 MED ORDER — LISINOPRIL 10 MG PO TABS: 10.0000 mg | ORAL_TABLET | Freq: Every day | ORAL | 1 refills | Status: DC

## 2022-06-23 MED ORDER — AMLODIPINE BESYLATE 5 MG PO TABS: 5.0000 mg | ORAL_TABLET | Freq: Every evening | ORAL | Status: DC

## 2022-06-23 NOTE — Patient Instructions (Addendum)
Please review the attached list of medications and notify my office if there are any errors.   Please contact your eyecare professional to schedule a routine eye exam   It is recommended to engage in 150 minutes of moderate exercise every week.    Start amlodipine 1m once every evening. Take Lisinopril and hctz in the morning.

## 2022-06-23 NOTE — Progress Notes (Signed)
Dustin Guerra,acting as a scribe for Dustin Merry, MD.,have documented all relevant documentation on the behalf of Dustin Merry, MD,as directed by  Dustin Merry, MD while in the presence of Dustin Merry, MD.    Complete physical exam   Patient: Dustin Guerra   DOB: 12-02-1955   67 y.o. Male  MRN: [id number] Visit Date: 06/23/2022  Today's healthcare provider: Mila Merry, MD    Subjective    Dustin Guerra is a 67 y.o. male who presents today for a complete physical exam.  He reports consuming a general diet. The patient does not participate in regular exercise at present. He generally feels well. He reports sleeping well. He does not have additional problems to discuss today.   He is due for follow up prediabetes, hypertension, PVD, and neuropathy.  Lab Results  Component Value Date   CHOL 112 06/04/2021   HDL 39 (L) 06/04/2021   LDLCALC 60 06/04/2021   TRIG 61 06/04/2021   CHOLHDL 2.9 06/04/2021   Last metabolic panel Lab Results  Component Value Date   GLUCOSE 111 (H) 06/04/2021   NA 138 06/04/2021   K 4.2 06/04/2021   CL 99 06/04/2021   CO2 26 06/04/2021   BUN 11 06/04/2021   CREATININE 1.05 06/04/2021   EGFR 80 06/04/2021   CALCIUM 9.3 06/04/2021   PHOS 3.2 08/22/2016   PROT 6.7 06/04/2021   ALBUMIN 4.5 06/04/2021   LABGLOB 2.2 06/04/2021   AGRATIO 2.0 06/04/2021   BILITOT 0.6 06/04/2021   ALKPHOS 124 (H) 06/04/2021   AST 19 06/04/2021   ALT 16 06/04/2021   ANIONGAP 6 11/12/2020   Lab Results  Component Value Date   VITAMINB12 779 08/31/2020   Continues to have persistently numbness in tingling, but unchanged. Tolerating current medications well, which he is taking consistently.    Past Medical History:  Diagnosis Date   Anxiety    Arthritis    Charcot's joint    left ankle   Diabetes mellitus without complication (HCC)    GERD (gastroesophageal reflux disease)    Hypertension    Neuropathy    bilateral feet per pt    Plantar fasciitis 08/04/2015   PONV (postoperative nausea and vomiting)    Sleep apnea    Past Surgical History:  Procedure Laterality Date   BACK SURGERY     COLON SURGERY  2015   Colon resection due to diverticulosis   COLONOSCOPY WITH PROPOFOL N/A 03/20/2022   Procedure: COLONOSCOPY WITH PROPOFOL;  Surgeon: Wyline Mood, MD;  Location[facility] END[facility]COPY;  Service: Gastroenterology;  Laterality: N/A;   COLONOSCOPY WITH PROPOFOL N/A 03/21/2022   Procedure: COLONOSCOPY WITH PROPOFOL;  Surgeon: Wyline Mood, MD;  Location: [facility] ENDOSCOPY;  Service: Gastroenterology;  Laterality: N/A;   ESOPHAGOGASTRODUODENOSCOPY (EGD) WITH PROPOFOL N/A 03/20/2022   Procedure: ESOPHAGOGASTRODUODENOSCOPY (EGD) WITH PROPOFOL;  Surgeon: Wyline Mood, MD;  Location: [facility] ENDOSCOPY;  Service: Gastroenterology;  Laterality: N/A;   ESOPHAGOGASTRODUODENOSCOPY (EGD) WITH PROPOFOL N/A 03/21/2022   Procedure: ESOPHAGOGASTRODUODENOSCOPY (EGD) WITH PROPOFOL;  Surgeon: Wyline Mood, MD;  Location: [facility] ENDOSCOPY;  Service: Gastroenterology;  Laterality: N/A;   HERNIA REPAIR     VEIN LIGATION AND STRIPPING     VEIN SURGERY     Vein stripping at age 56-25   VENTRAL HERNIA REPAIR N/A 11/22/2020   Procedure: OPEN VENTRAL INCISIONAL HERNIA REPAIR WITH MESH PATCH;  Surgeon: Darnell Level, MD;  Location: [facility] ORS;  Service: General;  Laterality: N/A;   Social History   Socioeconomic History  Marital status: Divorced    Spouse name: Not on file   Number of children: Not on file   Years of education: Not on file   Highest education level: Not on file  Occupational History   Not on file  Tobacco Use   Smoking status: Former    Packs/day: 1.50    Years: 10.00    Additional pack years: 0.00    Total pack years: 15.00    Types: Cigarettes    Quit date: 08/15/1998    Years since quitting: 23.8    Passive exposure: Never   Smokeless tobacco: Former    Types: Chew    Quit date: 2015   Tobacco comments:    quit 2000, smoked  about 1.5 packs for about 5 years  Vaping Use   Vaping Use: Never used  Substance and Sexual Activity   Alcohol use: Yes    Alcohol/week: 10.0 standard drinks of alcohol    Types: 10 Glasses of wine per week    Comment: Red wine before bed   Drug use: No   Sexual activity: Not Currently    Birth control/protection: Condom  Other Topics Concern   Not on file  Social History Narrative   Not on file   Social Determinants of Health   Financial Resource Strain: Low Risk  (06/06/2022)   Overall Financial Resource Strain (CARDIA)    Difficulty of Paying Living Expenses: Not very hard  Food Insecurity: No Food Insecurity (06/06/2022)   Hunger Vital Sign    Worried About Running Out of Food in the Last Year: Never true    Ran Out of Food in the Last Year: Never true  Transportation Needs: No Transportation Needs (06/06/2022)   PRAPARE - Administrator, Civil ServiceTransportation    Lack of Transportation (Medical): No    Lack of Transportation (Non-Medical): No  Physical Activity: Sufficiently Active (06/06/2022)   Exercise Vital Sign    Days of Exercise per Week: 6 days    Minutes of Exercise per Session: 90 min  Stress: No Stress Concern Present (06/06/2022)   [employer] - Occupational Stress Questionnaire    Feeling of Stress : Only a little  Social Connections: Unknown (06/06/2022)   Social Connection and Isolation Panel [NHANES]    Frequency of Communication with Friends and Family: More than three times a week    Frequency of Social Gatherings with Friends and Family: Three times a week    Attends Religious Services: Not on file    Active Member of Clubs or Organizations: No    Attends Club or Organization Meetings: Never    Marital Status: Divorced  Intimate Partner Violence: Not At Risk (06/10/2022)   Humiliation, Afraid, Rape, and Kick questionnaire    Fear of Current or Ex-Partner: No    Emotionally Abused: No    Physically Abused: No    Sexually Abused: No   Family  Status  Relation Name Status   Mother Dustin Guerra Deceased at age 65   Father Dustin Guerra Deceased at age 65   Brother  Deceased at age 65   Family History  Problem Relation Age of Onset   Alcohol abuse Mother    Esophageal cancer Father    Cancer Father    Heart failure Brother    Allergies  Allergen Reactions   Bee Venom Itching and Swelling   Ibuprofen     No ibuprofen per pt due to another drug he takes     Patient Care Team: Sherrie MustacheFisher,  Demetrios Isaacs, MD as PCP - General (Family Medicine) Gilda Crease, Latina Craver, MD (Vascular Surgery) Felecia Shelling, DPM as Consulting Physician (Podiatry)   Medications: Outpatient Medications Prior to Visit  Medication Sig   amLODipine (NORVASC) 5 MG tablet TAKE 1 TABLET BY MOUTH DAILY   Ascorbic Acid (VITAMIN C) 1000 MG tablet Take 1,000 mg by mouth daily.   Azelaic Acid 15 % FOAM Apply topically.   Cholecalciferol (VITAMIN D3) 50 MCG (2000 UT) TABS Take 1 tablet by mouth daily.   cilostazol (PLETAL) 50 MG tablet TAKE ONE TABLET BY MOUTH TWICE A DAY   diazepam (VALIUM) 10 MG tablet TAKE 1 TABLET BY MOUTH DAILY AS NEEDED FOR ANXIETY   diclofenac Sodium (VOLTAREN) 1 % GEL Apply 2 g topically 4 (four) times daily.   EPINEPHrine 0.3 mg/0.3 mL IJ SOAJ injection as needed.    hydrochlorothiazide (HYDRODIURIL) 12.5 MG tablet TAKE ONE TABLET BY MOUTH DAILY   HYDROcodone-acetaminophen (NORCO) 7.5-325 MG tablet 1/2-1 tablet up to four times a day as needed   hydrOXYzine (VISTARIL) 25 MG capsule Take 1 capsule (25 mg total) by mouth every 8 (eight) hours as needed.   lansoprazole (PREVACID) 30 MG capsule TAKE ONE CAPSULE BY MOUTH DAILY AT 12 NOON   magnesium oxide (MAG-OX) 400 MG tablet Take 1 tablet by mouth daily.   meclizine (ANTIVERT) 25 MG tablet Take 1 tablet (25 mg total) by mouth 3 (three) times daily as needed for dizziness.   meloxicam (MOBIC) 15 MG tablet Take 1 tablet (15 mg total) by mouth daily as needed.   pravastatin (PRAVACHOL) 40 MG tablet Take 1  tablet (40 mg total) by mouth daily.   pregabalin (LYRICA) 225 MG capsule TAKE ONE CAPSULE BY MOUTH THREE TIMES A DAY   psyllium (METAMUCIL) 58.6 % powder Take 1 packet by mouth 3 (three) times daily. Large spoonful in a cup of water per day   selenium 200 MCG TABS tablet Take 200 mcg by mouth daily.   sildenafil (VIAGRA) 100 MG tablet TAKE 1/2 TO 1 TABLET BY MOUTH DAILY AS NEEDED FOR ERECTILE DYSFUNCTION   silver sulfADIAZINE (SILVADENE) 1 % cream Apply 1 Application topically daily.   traMADol (ULTRAM) 50 MG tablet Take 1 tablet (50 mg total) by mouth 3 (three) times daily as needed.   vitamin B-12 (CYANOCOBALAMIN) 500 MCG tablet Take 500 mcg by mouth daily.   vitamin E 180 MG (400 UNITS) capsule Take 400 Units by mouth daily.   Zinc Sulfate 220 (50 Zn) MG TABS Take 1 tablet by mouth daily.   No facility-administered medications prior to visit.    Review of Systems  Constitutional: Negative.   HENT:  Positive for dental problem.   Eyes:  Positive for discharge.  Respiratory: Negative.    Cardiovascular: Negative.   Gastrointestinal: Negative.   Endocrine: Negative.   Genitourinary: Negative.   Musculoskeletal: Negative.   Skin: Negative.   Allergic/Immunologic: Negative.   Neurological: Negative.   Hematological: Negative.   Psychiatric/Behavioral: Negative.        Objective    BP (!) 158/87 (BP Location: Left Arm, Patient Position: Sitting, Cuff Size: Normal)   Pulse 66   Temp 98.1 F (36.7 C) (Oral)   Ht 6\' 2"  (1.88 m)   Wt 244 lb (110.7 kg)   SpO2 100%   BMI 31.33 kg/m  Vitals:   06/23/22 0948 06/23/22 0950  BP: (!) 164/90 (!) 158/87  Pulse: 66   Temp: 98.1 F (36.7 C)   TempSrc:  Oral   SpO2: 100%   Weight: 244 lb (110.7 kg)   Height: 6\' 2"  (1.88 m)     Physical Exam   General Appearance:    Mildly obese male. Alert, cooperative, in no acute distress, appears stated age  Head:    Normocephalic, without obvious abnormality, atraumatic  Eyes:    PERRL,  conjunctiva/corneas clear, EOM's intact, fundi    benign, both eyes       Ears:    Normal TM's and external ear canals, both ears  Nose:   Nares normal, septum midline, mucosa normal, no drainage   or sinus tenderness  Throat:   Lips, mucosa, and tongue normal; teeth and gums normal  Neck:   Supple, symmetrical, trachea midline, no adenopathy;       thyroid:  No enlargement/tenderness/nodules; no carotid   bruit or JVD  Back:     Symmetric, no curvature, ROM normal, no CVA tenderness  Lungs:     Clear to auscultation bilaterally, respirations unlabored  Chest wall:    No tenderness or deformity  Heart:    Normal heart rate. Normal rhythm. No murmurs, rubs, or gallops.  S1 and S2 normal  Abdomen:     Soft, non-tender, bowel sounds active all four quadrants,    no masses, no organomegaly  Genitalia:    deferred  Rectal:    deferred  Extremities:   All extremities are intact. No cyanosis or edema  Pulses:   2+ and symmetric all extremities  Skin:   Skin color, texture, turgor normal, no rashes or lesions  Lymph nodes:   Cervical, supraclavicular, and axillary nodes normal  Neurologic:   CNII-XII intact. Normal strength, sensation and reflexes      throughout   EKG: NSR, normal ECG  Last depression screening scores    06/10/2022    2:40 PM 12/13/2021   10:15 AM 06/04/2021   11:00 AM  PHQ 2/9 Scores  PHQ - 2 Score 0 0 0  PHQ- 9 Score  0 1   Last fall risk screening    06/06/2022   12:31 PM  Fall Risk   Falls in the past year? 0   Last Audit-C alcohol use screening    06/06/2022   12:31 PM  Alcohol Use Disorder Test (AUDIT)  1. How often do you have a drink containing alcohol? 4  2. How many drinks containing alcohol do you have on a typical day when you are drinking? 0  3. How often do you have six or more drinks on one occasion? 0  AUDIT-C Score 4   A score of 3 or more in women, and 4 or more in men indicates increased risk for alcohol abuse, EXCEPT if all of the points  are from question 1   No results found for any visits on 06/23/22.  Assessment & Plan    Routine Health Maintenance and Physical Exam  Exercise Activities and Dietary recommendations  Goals   None     Immunization History  Administered Date(s) Administered   Tdap 08/27/2016   Zoster Recombinat (Shingrix) 12/09/2017, 03/26/2018    Health Maintenance  Topic Date Due   COVID-19 Vaccine (1) Never done   OPHTHALMOLOGY EXAM  Never done   Diabetic kidney evaluation - Urine ACR  11/20/2017   Diabetic kidney evaluation - eGFR measurement  06/05/2022   HEMOGLOBIN A1C  06/13/2022   INFLUENZA VACCINE  10/16/2022   Medicare Annual Wellness (AWV)  06/10/2023   COLONOSCOPY (Pts 45-5yrs Insurance  coverage will need to be confirmed)  03/21/2025   DTaP/Tdap/Td (2 - Td or Tdap) 08/28/2026   Hepatitis C Screening  Completed   HIV Screening  Completed   Zoster Vaccines- Shingrix  Completed   HPV VACCINES  Aged Out    Discussed health benefits of physical activity, and encouraged him to engage in regular exercise appropriate for his age and condition.   2. Prediabetes  - Hemoglobin A1c  3. Essential hypertension Not quite to goal - Magnesium  Add lisinopril (ZESTRIL) 10 MG tablet; Take 1 tablet (10 mg total) by mouth daily.  Dispense: 30 tablet; Refill: 1 Continue hctz in a.m and start taking amlodipine in the evening instead of a.m - amLODipine   Follow up BP in 1 month.   4. Idiopathic neuropathy Stable on current medications.   5. PVD (peripheral vascular disease)/Aortic atherosclerosis Asymptomatic. Compliant with medication.  Continue aggressive risk factor modification.   - CBC - Comprehensive metabolic panel - Lipid panel     The entirety of the information documented in the History of Present Illness, Review of Systems and Physical Exam were personally obtained by me. Portions of this information were initially documented by the CMA and reviewed by me for thoroughness  and accuracy.     Dustin Merry, MD  [facility] [phone] (phone) [phone] (fax)  [facility]

## 2022-06-24 LAB — CBC
Hematocrit: 37.5 % (ref 37.5–51.0)
Hemoglobin: 12.6 g/dL — ABNORMAL LOW (ref 13.0–17.7)
MCH: 33.4 pg — ABNORMAL HIGH (ref 26.6–33.0)
MCHC: 33.6 g/dL (ref 31.5–35.7)
MCV: 100 fL — ABNORMAL HIGH (ref 79–97)
Platelets: 162 10*3/uL (ref 150–450)
RBC: 3.77 x10E6/uL — ABNORMAL LOW (ref 4.14–5.80)
RDW: 12.9 % (ref 11.6–15.4)
WBC: 4.6 10*3/uL (ref 3.4–10.8)

## 2022-06-24 LAB — COMPREHENSIVE METABOLIC PANEL
ALT: 20 IU/L (ref 0–44)
AST: 22 IU/L (ref 0–40)
Albumin/Globulin Ratio: 2 (ref 1.2–2.2)
Albumin: 4.5 g/dL (ref 3.9–4.9)
Alkaline Phosphatase: 137 IU/L — ABNORMAL HIGH (ref 44–121)
BUN/Creatinine Ratio: 15 (ref 10–24)
BUN: 15 mg/dL (ref 8–27)
Bilirubin Total: 0.4 mg/dL (ref 0.0–1.2)
CO2: 26 mmol/L (ref 20–29)
Calcium: 9.1 mg/dL (ref 8.6–10.2)
Chloride: 100 mmol/L (ref 96–106)
Creatinine, Ser: 1.02 mg/dL (ref 0.76–1.27)
Globulin, Total: 2.3 g/dL (ref 1.5–4.5)
Glucose: 125 mg/dL — ABNORMAL HIGH (ref 70–99)
Potassium: 4 mmol/L (ref 3.5–5.2)
Sodium: 141 mmol/L (ref 134–144)
Total Protein: 6.8 g/dL (ref 6.0–8.5)
eGFR: 82 mL/min/{1.73_m2} (ref >59)

## 2022-06-24 LAB — LIPID PANEL
Chol/HDL Ratio: 2.7 ratio (ref 0.0–5.0)
Cholesterol, Total: 111 mg/dL (ref 100–199)
HDL: 41 mg/dL (ref >39)
LDL Chol Calc (NIH): 55 mg/dL (ref 0–99)
Triglycerides: 75 mg/dL (ref 0–149)
VLDL Cholesterol Cal: 15 mg/dL (ref 5–40)

## 2022-06-24 LAB — HEMOGLOBIN A1C
Est. average glucose Bld gHb Est-mCnc: 143 mg/dL
Hgb A1c MFr Bld: 6.6 % — ABNORMAL HIGH (ref 4.8–5.6)

## 2022-06-24 LAB — MAGNESIUM: Magnesium: 1.9 mg/dL (ref 1.6–2.3)

## 2022-06-27 ENCOUNTER — Telehealth: Payer: Self-pay | Admitting: Family Medicine

## 2022-06-27 NOTE — Telephone Encounter (Signed)
Can someone please [facility] about this today. Was initially requested in result note Tuesday and specimen will be thrown out if [facility] not contacted today.

## 2022-06-27 NOTE — Telephone Encounter (Signed)
Please have [facility] ADD vitamin B12 level to labs drawn on 06/23/2022 due to anemia.

## 2022-06-27 NOTE — Telephone Encounter (Signed)
[facility] has been called and they will let us know if they can add the test

## 2022-07-02 LAB — SPECIMEN STATUS REPORT

## 2022-07-02 LAB — VITAMIN B12: Vitamin B-12: 585 pg/mL (ref 232–1245)

## 2022-07-05 ENCOUNTER — Other Ambulatory Visit: Payer: Self-pay | Admitting: Family Medicine

## 2022-07-09 DIAGNOSIS — G4733 Obstructive sleep apnea (adult) (pediatric): Secondary | ICD-10-CM | POA: Diagnosis not present

## 2022-07-17 ENCOUNTER — Other Ambulatory Visit: Payer: Self-pay | Admitting: Family Medicine

## 2022-07-17 DIAGNOSIS — G629 Polyneuropathy, unspecified: Secondary | ICD-10-CM

## 2022-07-17 MED ORDER — HYDROCODONE-ACETAMINOPHEN 7.5-325 MG PO TABS: ORAL_TABLET | ORAL | 0 refills | Status: DC

## 2022-07-22 NOTE — Progress Notes (Unsigned)
Established patient visit   Patient: Dustin Guerra   DOB: Feb 06, 1956   67 y.o. Male  MRN: [id number] Visit Date: 07/23/2022  Today's healthcare provider: Mila Merry, MD   No chief complaint on file.  Subjective    HPI  Hypertension, follow-up  BP Readings from Last 3 Encounters:  06/23/22 (!) 158/87  03/21/22 (!) 140/91  03/20/22 (!) 170/101   Wt Readings from Last 3 Encounters:  06/23/22 244 lb (110.7 kg)  06/10/22 235 lb (106.6 kg)  03/21/22 234 lb 7.7 oz (106.4 kg)     He was last seen for hypertension 1 months ago.  BP at that visit was as above. Management since that visit includes adding Lisinopril to his already on HCTZ and Amlodipine.  He reports {excellent/good/fair/poor:19665} compliance with treatment. He {is/is not:9024} having side effects. {document side effects if present:1} He is following a {diet:21022986} diet. He {is/is not:9024} exercising. He {does/does not:200015} smoke.  Use of agents associated with hypertension: {bp agents assoc with hypertension:511::"none"}.   Outside blood pressures are {***enter patient reported home BP readings, or 'not being checked':1}. Symptoms: {Yes/No:20286} chest pain {Yes/No:20286} chest pressure  {Yes/No:20286} palpitations {Yes/No:20286} syncope  {Yes/No:20286} dyspnea {Yes/No:20286} orthopnea  {Yes/No:20286} paroxysmal nocturnal dyspnea {Yes/No:20286} lower extremity edema   Pertinent labs Lab Results  Component Value Date   CHOL 111 06/23/2022   HDL 41 06/23/2022   LDLCALC 55 06/23/2022   TRIG 75 06/23/2022   CHOLHDL 2.7 06/23/2022   Lab Results  Component Value Date   NA 141 06/23/2022   K 4.0 06/23/2022   CREATININE 1.02 06/23/2022   EGFR 82 06/23/2022   GLUCOSE 125 (H) 06/23/2022   TSH 4.170 09/05/2019     The ASCVD Risk score (Arnett DK, et al., 2019) failed to calculate for the following reasons:   The valid total cholesterol range is 130 to 320  mg/dL  ---------------------------------------------------------------------------------------------------   Medications: Outpatient Medications Prior to Visit  Medication Sig   amLODipine (NORVASC) 5 MG tablet Take 1 tablet (5 mg total) by mouth every evening.   Ascorbic Acid (VITAMIN C) 1000 MG tablet Take 1,000 mg by mouth daily.   Azelaic Acid 15 % FOAM Apply topically.   Cholecalciferol (VITAMIN D3) 50 MCG (2000 UT) TABS Take 1 tablet by mouth daily.   cilostazol (PLETAL) 50 MG tablet TAKE ONE TABLET BY MOUTH TWICE A DAY   diazepam (VALIUM) 10 MG tablet TAKE 1 TABLET BY MOUTH DAILY AS NEEDED FOR ANXIETY   diclofenac Sodium (VOLTAREN) 1 % GEL Apply 2 g topically 4 (four) times daily.   EPINEPHrine 0.3 mg/0.3 mL IJ SOAJ injection as needed.    hydrochlorothiazide (HYDRODIURIL) 12.5 MG tablet TAKE ONE TABLET BY MOUTH DAILY   HYDROcodone-acetaminophen (NORCO) 7.5-325 MG tablet 1/2-1 tablet up to four times a day as needed   hydrOXYzine (VISTARIL) 25 MG capsule Take 1 capsule (25 mg total) by mouth every 8 (eight) hours as needed.   lansoprazole (PREVACID) 30 MG capsule TAKE ONE CAPSULE BY MOUTH DAILY AT 12 NOON   lisinopril (ZESTRIL) 10 MG tablet Take 1 tablet (10 mg total) by mouth daily.   magnesium oxide (MAG-OX) 400 MG tablet Take 1 tablet by mouth daily.   meclizine (ANTIVERT) 25 MG tablet Take 1 tablet (25 mg total) by mouth 3 (three) times daily as needed for dizziness.   meloxicam (MOBIC) 15 MG tablet TAKE ONE TABLET BY MOUTH DAILY AS NEEDED   pravastatin (PRAVACHOL) 40 MG tablet Take 1  tablet (40 mg total) by mouth daily.   pregabalin (LYRICA) 225 MG capsule TAKE ONE CAPSULE BY MOUTH THREE TIMES A DAY   psyllium (METAMUCIL) 58.6 % powder Take 1 packet by mouth 3 (three) times daily. Large spoonful in a cup of water per day   selenium 200 MCG TABS tablet Take 200 mcg by mouth daily.   sildenafil (VIAGRA) 100 MG tablet TAKE 1/2 TO 1 TABLET BY MOUTH DAILY AS NEEDED FOR ERECTILE  DYSFUNCTION   silver sulfADIAZINE (SILVADENE) 1 % cream Apply 1 Application topically daily.   traMADol (ULTRAM) 50 MG tablet Take 1 tablet (50 mg total) by mouth 3 (three) times daily as needed.   vitamin B-12 (CYANOCOBALAMIN) 500 MCG tablet Take 500 mcg by mouth daily.   vitamin E 180 MG (400 UNITS) capsule Take 400 Units by mouth daily.   Zinc Sulfate 220 (50 Zn) MG TABS Take 1 tablet by mouth daily.   No facility-administered medications prior to visit.    Review of Systems  {Labs  Heme  Chem  Endocrine  Serology  Results Review (optional):23779}   Objective    There were no vitals taken for this visit. {Show previous vital signs (optional):23777}  Physical Exam  ***  No results found for any visits on 07/23/22.  Assessment & Plan     ***  No follow-ups on file.      {provider attestation***:1}   Mila Merry, MD  [facility] [phone] (phone) [phone] (fax)  [facility]

## 2022-07-23 ENCOUNTER — Encounter: Payer: Self-pay | Admitting: Family Medicine

## 2022-07-23 ENCOUNTER — Ambulatory Visit (INDEPENDENT_AMBULATORY_CARE_PROVIDER_SITE_OTHER): Payer: PPO | Admitting: Family Medicine

## 2022-07-23 VITALS — BP 131/70 | HR 69 | Ht 74.0 in | Wt 244.0 lb

## 2022-07-23 DIAGNOSIS — I10 Essential (primary) hypertension: Secondary | ICD-10-CM | POA: Diagnosis not present

## 2022-07-23 NOTE — Patient Instructions (Signed)
.   Please review the attached list of medications and notify my office if there are any errors.   . Please bring all of your medications to every appointment so we can make sure that our medication list is the same as yours.   

## 2022-08-06 ENCOUNTER — Telehealth: Payer: Self-pay

## 2022-08-06 NOTE — Telephone Encounter (Signed)
Orders for CPAP renewal received via fax. Per Dr. Sherrie Mustache, patient needs to schedule ov for renewal of CPAP. Tried calling patient. Left message to call back. OK for [facility] triage to advise and schedule appointment.   Renewal forms left up front for Dustin Guerra to hold until patient comes in for appt.

## 2022-08-08 DIAGNOSIS — G4733 Obstructive sleep apnea (adult) (pediatric): Secondary | ICD-10-CM | POA: Diagnosis not present

## 2022-08-12 ENCOUNTER — Ambulatory Visit (INDEPENDENT_AMBULATORY_CARE_PROVIDER_SITE_OTHER): Payer: PPO | Admitting: Family Medicine

## 2022-08-12 ENCOUNTER — Encounter: Payer: Self-pay | Admitting: Family Medicine

## 2022-08-12 VITALS — BP 119/62 | HR 99 | Temp 98.2°F | Resp 16 | Ht 74.0 in | Wt 236.8 lb

## 2022-08-12 DIAGNOSIS — M25541 Pain in joints of right hand: Secondary | ICD-10-CM

## 2022-08-12 DIAGNOSIS — M25542 Pain in joints of left hand: Secondary | ICD-10-CM

## 2022-08-12 DIAGNOSIS — L723 Sebaceous cyst: Secondary | ICD-10-CM | POA: Diagnosis not present

## 2022-08-12 DIAGNOSIS — G4733 Obstructive sleep apnea (adult) (pediatric): Secondary | ICD-10-CM

## 2022-08-12 DIAGNOSIS — L089 Local infection of the skin and subcutaneous tissue, unspecified: Secondary | ICD-10-CM | POA: Diagnosis not present

## 2022-08-12 MED ORDER — DOXYCYCLINE HYCLATE 100 MG PO TABS: 100.0000 mg | ORAL_TABLET | Freq: Two times a day (BID) | ORAL | 0 refills | Status: AC

## 2022-08-12 MED ORDER — MELOXICAM 15 MG PO TABS: 15.0000 mg | ORAL_TABLET | Freq: Every day | ORAL | 2 refills | Status: DC | PRN

## 2022-08-12 NOTE — Progress Notes (Signed)
I,Sulibeya S Dimas,acting as a scribe for Mila Merry, MD.,have documented all relevant documentation on the behalf of Mila Merry, MD,as directed by  Mila Merry, MD while in the presence of Mila Merry, MD.     Established patient visit   Patient: Dustin Guerra   DOB: August 20, 1955   67 y.o. Male  MRN: [id number] Visit Date: 08/12/2022  Today's healthcare provider: Mila Merry, MD   Chief Complaint  Patient presents with   Sleep Apnea   Subjective    HPI  Follow up for OSA  The patient was last seen for this 1 years ago. Changes made at last visit include no changes.  He reports excellent compliance with treatment. Patient reports wearing CPAP machine every night at least 5-6 hours nightly.  He feels that condition is Improved. 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  He is not having side effects.   ----------------------------------------------------------------------------------------- Patient reports several tick bites on his back and left leg. He reports bites occurred over the weekend.   Patient reports knot on groin area x 5 days.  He reports pain on grain area.   Medications: Outpatient Medications Prior to Visit  Medication Sig   amLODipine (NORVASC) 5 MG tablet Take 1 tablet (5 mg total) by mouth every evening.   Ascorbic Acid (VITAMIN C) 1000 MG tablet Take 1,000 mg by mouth daily.   Azelaic Acid 15 % FOAM Apply topically.   Cholecalciferol (VITAMIN D3) 50 MCG (2000 UT) TABS Take 1 tablet by mouth daily.   cilostazol (PLETAL) 50 MG tablet TAKE ONE TABLET BY MOUTH TWICE A DAY   diazepam (VALIUM) 10 MG tablet TAKE 1 TABLET BY MOUTH DAILY AS NEEDED FOR ANXIETY   diclofenac Sodium (VOLTAREN) 1 % GEL Apply 2 g topically 4 (four) times daily.   EPINEPHrine 0.3 mg/0.3 mL IJ SOAJ injection as needed.    hydrochlorothiazide (HYDRODIURIL) 12.5 MG tablet TAKE ONE TABLET BY MOUTH DAILY   HYDROcodone-acetaminophen (NORCO) 7.5-325 MG  tablet 1/2-1 tablet up to four times a day as needed   hydrOXYzine (VISTARIL) 25 MG capsule Take 1 capsule (25 mg total) by mouth every 8 (eight) hours as needed.   lansoprazole (PREVACID) 30 MG capsule TAKE ONE CAPSULE BY MOUTH DAILY AT 12 NOON   lisinopril (ZESTRIL) 10 MG tablet Take 1 tablet (10 mg total) by mouth daily.   magnesium oxide (MAG-OX) 400 MG tablet Take 1 tablet by mouth daily.   meclizine (ANTIVERT) 25 MG tablet Take 1 tablet (25 mg total) by mouth 3 (three) times daily as needed for dizziness.   meloxicam (MOBIC) 15 MG tablet TAKE ONE TABLET BY MOUTH DAILY AS NEEDED   pravastatin (PRAVACHOL) 40 MG tablet Take 1 tablet (40 mg total) by mouth daily.   pregabalin (LYRICA) 225 MG capsule TAKE ONE CAPSULE BY MOUTH THREE TIMES A DAY   psyllium (METAMUCIL) 58.6 % powder Take 1 packet by mouth 3 (three) times daily. Large spoonful in a cup of water per day   selenium 200 MCG TABS tablet Take 200 mcg by mouth daily.   sildenafil (VIAGRA) 100 MG tablet TAKE 1/2 TO 1 TABLET BY MOUTH DAILY AS NEEDED FOR ERECTILE DYSFUNCTION   silver sulfADIAZINE (SILVADENE) 1 % cream Apply 1 Application topically daily.   traMADol (ULTRAM) 50 MG tablet Take 1 tablet (50 mg total) by mouth 3 (three) times daily as needed.   vitamin B-12 (CYANOCOBALAMIN) 500 MCG tablet Take 500 mcg by mouth daily.  vitamin E 180 MG (400 UNITS) capsule Take 400 Units by mouth daily.   Zinc Sulfate 220 (50 Zn) MG TABS Take 1 tablet by mouth daily.   No facility-administered medications prior to visit.    Review of Systems  Constitutional:  Negative for appetite change and fatigue.  Respiratory:  Negative for cough, chest tightness and shortness of breath.   Cardiovascular:  Negative for chest pain.  Musculoskeletal:  Negative for back pain.  Skin:  Positive for rash.       Objective    BP 119/62 (BP Location: Right Arm, Patient Position: Sitting, Cuff Size: Large)   Pulse 99   Temp 98.2 F (36.8 C) (Temporal)    Resp 16   Ht 6\' 2"  (1.88 m)   Wt 236 lb 12.8 oz (107.4 kg)   SpO2 98%   BMI 30.40 kg/m  BP Readings from Last 3 Encounters:  08/12/22 119/62  07/23/22 131/70  06/23/22 (!) 158/87   Wt Readings from Last 3 Encounters:  08/12/22 236 lb 12.8 oz (107.4 kg)  07/23/22 244 lb (110.7 kg)  06/23/22 244 lb (110.7 kg)   Physical Exam   Tender red 3-4 cm inflamed subcutaneous cystic mass left inguinal area. No red streaks or surrounding erythema. No open wounds or discharge.   Assessment & Plan     1. OSA on CPAP Doing very well on CPAP which he has been using consistently all night every night since starting to use is last year. Is medically benefiting from its use. Completed forms for CPAP supplies for [employer]  2. Infected sebaceous cyst  - doxycycline (VIBRA-TABS) 100 MG tablet; Take 1 tablet (100 mg total) by mouth 2 (two) times daily for 10 days.  Dispense: 20 tablet; Refill: 0  3. Arthralgia of both hands Refill  meloxicam (MOBIC) 15 MG tablet; Take 1 tablet (15 mg total) by mouth daily as needed.  Dispense: 90 tablet; Refill: 2      The entirety of the information documented in the History of Present Illness, Review of Systems and Physical Exam were personally obtained by me. Portions of this information were initially documented by the CMA and reviewed by me for thoroughness and accuracy.     Mila Merry, MD  [facility] [phone] (phone) [phone] (fax)  [facility]

## 2022-08-19 ENCOUNTER — Other Ambulatory Visit: Payer: Self-pay | Admitting: Family Medicine

## 2022-08-19 ENCOUNTER — Telehealth: Payer: Self-pay

## 2022-08-19 DIAGNOSIS — I10 Essential (primary) hypertension: Secondary | ICD-10-CM

## 2022-08-19 NOTE — Telephone Encounter (Unsigned)
Copied from CRM [id number]. Topic: General - Other >> Aug 19, 2022  2:29 PM Franchot Heidelberg wrote: Reason for CRM: Velna Hatchet from [employer] needs office visit notes from 05/16/2022 until now  Best contact: [phone]

## 2022-08-19 NOTE — Telephone Encounter (Signed)
Requested Prescriptions  Pending Prescriptions Disp Refills   lisinopril (ZESTRIL) 10 MG tablet [Pharmacy Med Name: LISINOPRIL 10 MG TABLET] 90 tablet 1    Sig: TAKE 1 TABLET BY MOUTH DAILY     Cardiovascular:  ACE Inhibitors Passed - 08/19/2022  6:22 AM      Passed - Cr in normal range and within 180 days    Creat  Date Value Ref Range Status  11/20/2016 0.90 0.70 - 1.33 mg/dL Final    Comment:    For patients >65 years of age, the reference limit for Creatinine is approximately 13% higher for people identified as African-American. .    Creatinine, Ser  Date Value Ref Range Status  06/23/2022 1.02 0.76 - 1.27 mg/dL Final         Passed - K in normal range and within 180 days    Potassium  Date Value Ref Range Status  06/23/2022 4.0 3.5 - 5.2 mmol/L Final  06/04/2012 3.7 3.5 - 5.1 mmol/L Final         Passed - Patient is not pregnant      Passed - Last BP in normal range    BP Readings from Last 1 Encounters:  08/12/22 119/62         Passed - Valid encounter within last 6 months    Recent Outpatient Visits           1 week ago OSA on CPAP   [facility] Malva Limes, MD   3 weeks ago Primary hypertension   [facility]va Limes, [facility]   1 month ago Annual physical exam   [facility] Limes, [facility]   5 months ago Hematospermia   [facility] Malva Limes, MD   6 months ago Hematospermia   [facility] Malva Limes, MD       Future Appointments             In 2 months Fisher, Demetrios Isaacs, MD [facility]

## 2022-08-21 ENCOUNTER — Other Ambulatory Visit: Payer: Self-pay | Admitting: Family Medicine

## 2022-08-21 DIAGNOSIS — G629 Polyneuropathy, unspecified: Secondary | ICD-10-CM

## 2022-08-21 NOTE — Telephone Encounter (Signed)
Medication Refill - Medication: HYDROcodone-acetaminophen (NORCO) 7.5-325 MG tablet   Has the patient contacted their pharmacy? No.  Preferred Pharmacy (with phone number or street name):  [id number] [place], [place] - [address] Phone: [phone]  Fax: [phone]     Has the patient been seen for an appointment in the last year OR does the patient have an upcoming appointment? Yes.    Agent: Please be advised that RX refills may take up to 3 business days. We ask that you follow-up with your pharmacy.

## 2022-08-22 DIAGNOSIS — E785 Hyperlipidemia, unspecified: Secondary | ICD-10-CM | POA: Diagnosis not present

## 2022-08-22 DIAGNOSIS — G4733 Obstructive sleep apnea (adult) (pediatric): Secondary | ICD-10-CM | POA: Diagnosis not present

## 2022-08-22 DIAGNOSIS — F419 Anxiety disorder, unspecified: Secondary | ICD-10-CM | POA: Diagnosis not present

## 2022-08-22 DIAGNOSIS — E1142 Type 2 diabetes mellitus with diabetic polyneuropathy: Secondary | ICD-10-CM | POA: Diagnosis not present

## 2022-08-22 DIAGNOSIS — E1169 Type 2 diabetes mellitus with other specified complication: Secondary | ICD-10-CM | POA: Diagnosis not present

## 2022-08-22 DIAGNOSIS — I10 Essential (primary) hypertension: Secondary | ICD-10-CM | POA: Diagnosis not present

## 2022-08-22 DIAGNOSIS — E669 Obesity, unspecified: Secondary | ICD-10-CM | POA: Diagnosis not present

## 2022-08-22 DIAGNOSIS — K219 Gastro-esophageal reflux disease without esophagitis: Secondary | ICD-10-CM | POA: Diagnosis not present

## 2022-08-22 DIAGNOSIS — G8929 Other chronic pain: Secondary | ICD-10-CM | POA: Diagnosis not present

## 2022-08-22 DIAGNOSIS — E1151 Type 2 diabetes mellitus with diabetic peripheral angiopathy without gangrene: Secondary | ICD-10-CM | POA: Diagnosis not present

## 2022-08-22 DIAGNOSIS — Z87891 Personal history of nicotine dependence: Secondary | ICD-10-CM | POA: Diagnosis not present

## 2022-08-22 MED ORDER — HYDROCODONE-ACETAMINOPHEN 7.5-325 MG PO TABS: ORAL_TABLET | ORAL | 0 refills | Status: DC

## 2022-08-22 NOTE — Telephone Encounter (Signed)
Requested medication (s) are due for refill today: yes  Requested medication (s) are on the active medication list: yes  Last refill:  07/17/22  Future visit scheduled: yes  Notes to clinic:  Unable to refill per protocol, cannot delegate.      Requested Prescriptions  Pending Prescriptions Disp Refills   HYDROcodone-acetaminophen (NORCO) 7.5-325 MG tablet 120 tablet 0    Sig: 1/2-1 tablet up to four times a day as needed     Not Delegated - Analgesics:  Opioid Agonist Combinations Failed - 08/21/2022  4:41 PM      Failed - This refill cannot be delegated      Failed - Urine Drug Screen completed in last 360 days      Passed - Valid encounter within last 3 months    Recent Outpatient Visits           1 week ago OSA on CPAP    [facility]   1 month ago Primary hypertension   [facility] Malva Limes, MD   2 months ago Annual physical exam   [facility] Malva Limes, MD   5 months ago Hematospermia   [facility] Malva Limes, MD   6 months ago Hematospermia   [facility] Malva Limes, MD       Future Appointments             In 2 months Fisher, Demetrios Isaacs, MD [facility]

## 2022-09-07 ENCOUNTER — Other Ambulatory Visit: Payer: Self-pay | Admitting: Family Medicine

## 2022-09-07 DIAGNOSIS — I700 Atherosclerosis of aorta: Secondary | ICD-10-CM

## 2022-09-08 ENCOUNTER — Ambulatory Visit: Payer: Self-pay

## 2022-09-08 DIAGNOSIS — G4733 Obstructive sleep apnea (adult) (pediatric): Secondary | ICD-10-CM | POA: Diagnosis not present

## 2022-09-08 NOTE — Patient Outreach (Signed)
  Care Coordination   Initial Visit Note   09/08/2022 Name: Merrik Puebla MRN: [id number] DOB: 05-Apr-1955  Geno Sydnor is a 67 y.o. year old male who sees Fisher, Demetrios Isaacs, MD for primary care. I spoke with  [name] by phone today.  What matters to the patients health and wellness today?  CM educated patient on [facility] services.  Patient declines services and feels that he is able to manage his medical needs.  Patient agreed to contact his provider in the future if he needs [facility] services.    Goals Addressed   None     SDOH assessments and interventions completed:  No     Care Coordination Interventions:  No, not indicated   Follow up plan: No further intervention required.   Encounter Outcome:  Pt. Refused

## 2022-09-08 NOTE — Telephone Encounter (Signed)
Requested Prescriptions  Pending Prescriptions Disp Refills   pravastatin (PRAVACHOL) 40 MG tablet [Pharmacy Med Name: PRAVASTATIN SODIUM 40 MG TAB] 90 tablet 0    Sig: TAKE 1 TABLET BY MOUTH DAILY     Cardiovascular:  Antilipid - Statins Failed - 09/07/2022  6:52 AM      Failed - Lipid Panel in normal range within the last 12 months    Cholesterol, Total  Date Value Ref Range Status  06/23/2022 111 100 - 199 mg/dL Final   Cholesterol  Date Value Ref Range Status  06/04/2012 85 0 - 200 mg/dL Final   Ldl Cholesterol, Calc  Date Value Ref Range Status  06/04/2012 39 0 - 100 mg/dL Final   LDL Chol Calc (NIH)  Date Value Ref Range Status  06/23/2022 55 0 - 99 mg/dL Final   HDL Cholesterol  Date Value Ref Range Status  06/04/2012 24 (L) 40 - 60 mg/dL Final   HDL  Date Value Ref Range Status  06/23/2022 41 >39 mg/dL Final   Triglycerides  Date Value Ref Range Status  06/23/2022 75 0 - 149 mg/dL Final  60/45/4098 119 0 - 200 mg/dL Final         Passed - Patient is not pregnant      Passed - Valid encounter within last 12 months    Recent Outpatient Visits           3 weeks ago OSA on CPAP   [facility] Malva Limes, MD   1 month ago Primary hypertension   [facility] Malva [facility], MD   2 months ago Annual physical exam   [facility], [facility]   6 months ago Hematospermia   [facility] Malva [facility]   6 months ago Hematospermia   [facility] Malva Limes, MD       Future Appointments             In 1 month Fisher, Demetrios Isaacs, MD [facility]

## 2022-09-17 ENCOUNTER — Other Ambulatory Visit: Payer: Self-pay | Admitting: Family Medicine

## 2022-09-17 DIAGNOSIS — G629 Polyneuropathy, unspecified: Secondary | ICD-10-CM

## 2022-09-17 NOTE — Telephone Encounter (Signed)
Medication Refill - Medication: HYDROcodone-acetaminophen (NORCO) 7.5-325 MG tablet [[id number]]   Has the patient contacted their pharmacy? [id number] [place], [place] - [address] [place], [place] [address] Phone: [phone]  Fax: [phone] DEA #: --  DAW Reason: --        Has the patient been seen for an appointment in the last year OR does the patient have an upcoming appointment? Yes.    Agent: Please be advised that RX refills may take up to 3 business days. We ask that you follow-up with your pharmacy.

## 2022-09-17 NOTE — Telephone Encounter (Signed)
Requested medication (s) are due for refill today - yes  Requested medication (s) are on the active medication list -yes  Future visit scheduled -yes  Last refill: 08/22/22 #120  Notes to clinic: non delegated Rx  Requested Prescriptions  Pending Prescriptions Disp Refills   HYDROcodone-acetaminophen (NORCO) 7.5-325 MG tablet 120 tablet 0    Sig: 1/2-1 tablet up to four times a day as needed     Not Delegated - Analgesics:  Opioid Agonist Combinations Failed - 09/17/2022 10:18 AM      Failed - This refill cannot be delegated      Failed - Urine Drug Screen completed in last 360 days      Passed - Valid encounter within last 3 months    Recent Outpatient Visits           1 month ago OSA on CPAP   [facility] Mal[facility] Limes, [facility]   1 month ago Primary hypertension   [facility] Malva Limes, MD   2 months ago Annual physical exam   [facility], [facility]   6 months ago Hematospermia   [facility] Malva Limes, MD   7 months ago Hematospermia   [place] [facility] Sherrie Mustache, Demetrios Isaacs, MD       Future Appointments             In 1 month Fisher, Demetrios Isaacs, MD [facility]               Requested Prescriptions  Pending Prescriptions Disp Refills   HYDROcodone-acetaminophen (NORCO) 7.5-325 MG tablet 120 tablet 0    Sig: 1/2-1 tablet up to four times a day as needed     Not Delegated - Analgesics:  Opioid Agonist Combinations Failed - 09/17/2022 10:18 AM      Failed - This refill cannot be delegated      Failed - Urine Drug Screen completed in last 360 days      Passed - Valid encounter within last 3 months    Recent Outpatient Visits           1 month ago OSA on CPAP   [facility] Malva Limes, MD   1 month ago Primary hypertension   [place] [facility] Malva Limes, MD   2 months ago Annual physical exam   [facility]   6 months ago Hematospermia   [facility] Malva [facility], [facility]   7 months ago Hematospermia   [facility], MD       Future Appointments             In 1 month Fisher, Demetrios Isaacs, MD [facility]

## 2022-09-18 MED ORDER — HYDROCODONE-ACETAMINOPHEN 7.5-325 MG PO TABS: ORAL_TABLET | ORAL | 0 refills | Status: DC

## 2022-09-23 ENCOUNTER — Other Ambulatory Visit: Payer: Self-pay | Admitting: Family Medicine

## 2022-09-23 DIAGNOSIS — G629 Polyneuropathy, unspecified: Secondary | ICD-10-CM

## 2022-09-26 ENCOUNTER — Observation Stay
Admission: EM | Admit: 2022-09-26 | Discharge: 2022-09-27 | Disposition: A | Discharge status: Home / Self Care | Payer: PPO | Attending: Internal Medicine | Admitting: Internal Medicine

## 2022-09-26 ENCOUNTER — Observation Stay: Payer: PPO

## 2022-09-26 ENCOUNTER — Other Ambulatory Visit: Payer: Self-pay

## 2022-09-26 DIAGNOSIS — M25511 Pain in right shoulder: Secondary | ICD-10-CM | POA: Diagnosis present

## 2022-09-26 DIAGNOSIS — T782XXA Anaphylactic shock, unspecified, initial encounter: Secondary | ICD-10-CM | POA: Diagnosis not present

## 2022-09-26 DIAGNOSIS — F419 Anxiety disorder, unspecified: Secondary | ICD-10-CM | POA: Diagnosis present

## 2022-09-26 DIAGNOSIS — L509 Urticaria, unspecified: Secondary | ICD-10-CM | POA: Diagnosis not present

## 2022-09-26 DIAGNOSIS — Z79899 Other long term (current) drug therapy: Secondary | ICD-10-CM | POA: Insufficient documentation

## 2022-09-26 DIAGNOSIS — I2510 Atherosclerotic heart disease of native coronary artery without angina pectoris: Secondary | ICD-10-CM | POA: Insufficient documentation

## 2022-09-26 DIAGNOSIS — T783XXA Angioneurotic edema, initial encounter: Principal | ICD-10-CM

## 2022-09-26 DIAGNOSIS — E669 Obesity, unspecified: Secondary | ICD-10-CM | POA: Diagnosis present

## 2022-09-26 DIAGNOSIS — M19011 Primary osteoarthritis, right shoulder: Secondary | ICD-10-CM | POA: Diagnosis not present

## 2022-09-26 DIAGNOSIS — Z87891 Personal history of nicotine dependence: Secondary | ICD-10-CM | POA: Diagnosis not present

## 2022-09-26 DIAGNOSIS — I739 Peripheral vascular disease, unspecified: Secondary | ICD-10-CM | POA: Diagnosis present

## 2022-09-26 DIAGNOSIS — E785 Hyperlipidemia, unspecified: Secondary | ICD-10-CM | POA: Diagnosis not present

## 2022-09-26 DIAGNOSIS — L299 Pruritus, unspecified: Secondary | ICD-10-CM | POA: Diagnosis not present

## 2022-09-26 DIAGNOSIS — I10 Essential (primary) hypertension: Secondary | ICD-10-CM | POA: Diagnosis not present

## 2022-09-26 DIAGNOSIS — G4733 Obstructive sleep apnea (adult) (pediatric): Secondary | ICD-10-CM | POA: Diagnosis not present

## 2022-09-26 DIAGNOSIS — E1140 Type 2 diabetes mellitus with diabetic neuropathy, unspecified: Secondary | ICD-10-CM | POA: Diagnosis not present

## 2022-09-26 DIAGNOSIS — R069 Unspecified abnormalities of breathing: Secondary | ICD-10-CM | POA: Diagnosis not present

## 2022-09-26 DIAGNOSIS — T7840XA Allergy, unspecified, initial encounter: Secondary | ICD-10-CM | POA: Diagnosis not present

## 2022-09-26 LAB — CBC WITH DIFFERENTIAL/PLATELET
Abs Immature Granulocytes: 0.02 10*3/uL (ref 0.00–0.07)
Basophils Absolute: 0.1 10*3/uL (ref 0.0–0.1)
Basophils Relative: 1 %
Eosinophils Absolute: 0.2 10*3/uL (ref 0.0–0.5)
Eosinophils Relative: 3 %
HCT: 40.6 % (ref 39.0–52.0)
Hemoglobin: 14 g/dL (ref 13.0–17.0)
Immature Granulocytes: 0 %
Lymphocytes Relative: 32 %
Lymphs Abs: 2.3 10*3/uL (ref 0.7–4.0)
MCH: 33.3 pg (ref 26.0–34.0)
MCHC: 34.5 g/dL (ref 30.0–36.0)
MCV: 96.4 fL (ref 80.0–100.0)
Monocytes Absolute: 0.6 10*3/uL (ref 0.1–1.0)
Monocytes Relative: 9 %
Neutro Abs: 3.8 10*3/uL (ref 1.7–7.7)
Neutrophils Relative %: 55 %
Platelets: 162 10*3/uL (ref 150–400)
RBC: 4.21 MIL/uL — ABNORMAL LOW (ref 4.22–5.81)
RDW: 13.7 % (ref 11.5–15.5)
WBC: 7 10*3/uL (ref 4.0–10.5)
nRBC: 0 % (ref 0.0–0.2)

## 2022-09-26 LAB — BASIC METABOLIC PANEL
Anion gap: 10 (ref 5–15)
BUN: 17 mg/dL (ref 8–23)
CO2: 26 mmol/L (ref 22–32)
Calcium: 9 mg/dL (ref 8.9–10.3)
Chloride: 103 mmol/L (ref 98–111)
Creatinine, Ser: 1.07 mg/dL (ref 0.61–1.24)
GFR, Estimated: >60 mL/min (ref >60)
Glucose, Bld: 169 mg/dL — ABNORMAL HIGH (ref 70–99)
Potassium: 3.5 mmol/L (ref 3.5–5.1)
Sodium: 139 mmol/L (ref 135–145)

## 2022-09-26 LAB — CBG MONITORING, ED
Glucose-Capillary: 267 mg/dL — ABNORMAL HIGH (ref 70–99)
Glucose-Capillary: 216 mg/dL — ABNORMAL HIGH (ref 70–99)

## 2022-09-26 MED ORDER — SODIUM CHLORIDE 0.9 % IV SOLN: INTRAVENOUS | Status: DC

## 2022-09-26 MED ORDER — DIAZEPAM 5 MG PO TABS: 10.0000 mg | ORAL_TABLET | Freq: Every day | ORAL | Status: DC | PRN

## 2022-09-26 MED ORDER — ZINC SULFATE 220 (50 ZN) MG PO CAPS
220.0000 mg | ORAL_CAPSULE | Freq: Every day | ORAL | Status: DC
  Administered 2022-09-27: 220 mg via ORAL
  Filled 2022-09-26: qty 1

## 2022-09-26 MED ORDER — AMLODIPINE BESYLATE 5 MG PO TABS
5.0000 mg | ORAL_TABLET | Freq: Every evening | ORAL | Status: DC
  Administered 2022-09-26: 5 mg via ORAL
  Filled 2022-09-26: qty 1

## 2022-09-26 MED ORDER — EPINEPHRINE 0.3 MG/0.3ML IJ SOAJ: 0.3000 mg | Freq: Every day | INTRAMUSCULAR | Status: DC | PRN

## 2022-09-26 MED ORDER — CYANOCOBALAMIN 500 MCG PO TABS
500.0000 ug | ORAL_TABLET | Freq: Every day | ORAL | Status: DC
  Administered 2022-09-26 – 2022-09-27 (×2): 500 ug via ORAL
  Filled 2022-09-26 (×2): qty 1

## 2022-09-26 MED ORDER — PREGABALIN 75 MG PO CAPS
225.0000 mg | ORAL_CAPSULE | Freq: Three times a day (TID) | ORAL | Status: DC
  Administered 2022-09-26 – 2022-09-27 (×3): 225 mg via ORAL
  Filled 2022-09-26 (×3): qty 1

## 2022-09-26 MED ORDER — LIDOCAINE VISCOUS HCL 2 % MT SOLN
15.0000 mL | Freq: Once | OROMUCOSAL | Status: AC
  Administered 2022-09-26: 15 mL via OROMUCOSAL
  Filled 2022-09-26: qty 15

## 2022-09-26 MED ORDER — VITAMIN E 45 MG (100 UNIT) PO CAPS
400.0000 [IU] | ORAL_CAPSULE | Freq: Every day | ORAL | Status: DC
  Administered 2022-09-27: 400 [IU] via ORAL
  Filled 2022-09-26: qty 4

## 2022-09-26 MED ORDER — DIPHENHYDRAMINE HCL 50 MG/ML IJ SOLN
25.0000 mg | Freq: Two times a day (BID) | INTRAMUSCULAR | Status: DC
  Administered 2022-09-26 – 2022-09-27 (×2): 25 mg via INTRAVENOUS
  Filled 2022-09-26 (×2): qty 1

## 2022-09-26 MED ORDER — PANTOPRAZOLE SODIUM 20 MG PO TBEC
20.0000 mg | DELAYED_RELEASE_TABLET | Freq: Every day | ORAL | Status: DC
  Administered 2022-09-26 – 2022-09-27 (×2): 20 mg via ORAL
  Filled 2022-09-26 (×2): qty 1

## 2022-09-26 MED ORDER — METHYLPREDNISOLONE SODIUM SUCC 125 MG IJ SOLR
80.0000 mg | Freq: Two times a day (BID) | INTRAMUSCULAR | Status: DC
  Administered 2022-09-26 – 2022-09-27 (×2): 80 mg via INTRAVENOUS
  Filled 2022-09-26 (×2): qty 2

## 2022-09-26 MED ORDER — HYDRALAZINE HCL 20 MG/ML IJ SOLN: 5.0000 mg | INTRAMUSCULAR | Status: DC | PRN

## 2022-09-26 MED ORDER — ACETAMINOPHEN 325 MG PO TABS: 650.0000 mg | ORAL_TABLET | Freq: Four times a day (QID) | ORAL | Status: DC | PRN

## 2022-09-26 MED ORDER — VITAMIN D3 25 MCG (1000 UNIT) PO TABS
2000.0000 [IU] | ORAL_TABLET | Freq: Every day | ORAL | Status: DC
  Administered 2022-09-26 – 2022-09-27 (×2): 2000 [IU] via ORAL
  Filled 2022-09-26 (×4): qty 2

## 2022-09-26 MED ORDER — MECLIZINE HCL 25 MG PO TABS: 25.0000 mg | ORAL_TABLET | Freq: Three times a day (TID) | ORAL | Status: DC | PRN

## 2022-09-26 MED ORDER — ONDANSETRON HCL 4 MG/2ML IJ SOLN: 4.0000 mg | Freq: Three times a day (TID) | INTRAMUSCULAR | Status: DC | PRN

## 2022-09-26 MED ORDER — INSULIN ASPART 100 UNIT/ML IJ SOLN
0.0000 [IU] | Freq: Three times a day (TID) | INTRAMUSCULAR | Status: DC
  Administered 2022-09-26 – 2022-09-27 (×2): 5 [IU] via SUBCUTANEOUS
  Filled 2022-09-26 (×2): qty 1

## 2022-09-26 MED ORDER — DM-GUAIFENESIN ER 30-600 MG PO TB12: 1.0000 | ORAL_TABLET | Freq: Two times a day (BID) | ORAL | Status: DC | PRN

## 2022-09-26 MED ORDER — HYDROCODONE-ACETAMINOPHEN 7.5-325 MG PO TABS
1.0000 | ORAL_TABLET | Freq: Four times a day (QID) | ORAL | Status: DC | PRN
  Administered 2022-09-27: 1 via ORAL
  Filled 2022-09-26: qty 2

## 2022-09-26 MED ORDER — SODIUM CHLORIDE 0.9 % IV BOLUS
1000.0000 mL | Freq: Once | INTRAVENOUS | Status: AC
  Administered 2022-09-26: 1000 mL via INTRAVENOUS

## 2022-09-26 MED ORDER — VITAMIN C 500 MG PO TABS
1000.0000 mg | ORAL_TABLET | Freq: Every day | ORAL | Status: DC
  Administered 2022-09-26 – 2022-09-27 (×2): 1000 mg via ORAL
  Filled 2022-09-26 (×2): qty 2

## 2022-09-26 MED ORDER — FAMOTIDINE IN NACL 20-0.9 MG/50ML-% IV SOLN
20.0000 mg | Freq: Two times a day (BID) | INTRAVENOUS | Status: DC
  Administered 2022-09-26 – 2022-09-27 (×2): 20 mg via INTRAVENOUS
  Filled 2022-09-26 (×2): qty 50

## 2022-09-26 MED ORDER — PRAVASTATIN SODIUM 20 MG PO TABS
40.0000 mg | ORAL_TABLET | Freq: Every day | ORAL | Status: DC
  Administered 2022-09-26 – 2022-09-27 (×2): 40 mg via ORAL
  Filled 2022-09-26 (×2): qty 2

## 2022-09-26 MED ORDER — POLYETHYLENE GLYCOL 3350 17 GM/SCOOP PO POWD: 17.0000 g | ORAL | Status: DC | PRN

## 2022-09-26 MED ORDER — SELENIUM 200 MCG PO TABS
200.0000 ug | ORAL_TABLET | Freq: Every day | ORAL | Status: DC
  Administered 2022-09-27: 200 ug via ORAL
  Filled 2022-09-26: qty 1

## 2022-09-26 MED ORDER — CILOSTAZOL 100 MG PO TABS
50.0000 mg | ORAL_TABLET | Freq: Two times a day (BID) | ORAL | Status: DC
  Administered 2022-09-26 – 2022-09-27 (×2): 50 mg via ORAL
  Filled 2022-09-26 (×2): qty 0.5

## 2022-09-26 MED ORDER — ALBUTEROL SULFATE HFA 108 (90 BASE) MCG/ACT IN AERS: 2.0000 | INHALATION_SPRAY | RESPIRATORY_TRACT | Status: DC | PRN

## 2022-09-26 MED ORDER — FAMOTIDINE IN NACL 20-0.9 MG/50ML-% IV SOLN
20.0000 mg | Freq: Once | INTRAVENOUS | Status: AC
  Administered 2022-09-26: 20 mg via INTRAVENOUS
  Filled 2022-09-26: qty 50

## 2022-09-26 MED ORDER — INSULIN ASPART 100 UNIT/ML IJ SOLN
0.0000 [IU] | Freq: Every day | INTRAMUSCULAR | Status: DC
  Administered 2022-09-26: 2 [IU] via SUBCUTANEOUS
  Filled 2022-09-26: qty 1

## 2022-09-26 MED ORDER — ENOXAPARIN SODIUM 60 MG/0.6ML IJ SOSY
55.0000 mg | PREFILLED_SYRINGE | INTRAMUSCULAR | Status: DC
  Filled 2022-09-26: qty 0.6

## 2022-09-26 MED ORDER — EPINEPHRINE 0.3 MG/0.3ML IJ SOAJ: 0.3000 mg | Freq: Once | INTRAMUSCULAR | Status: DC

## 2022-09-26 MED ORDER — MAGNESIUM OXIDE -MG SUPPLEMENT 400 (240 MG) MG PO TABS
400.0000 mg | ORAL_TABLET | Freq: Every day | ORAL | Status: DC
  Administered 2022-09-26 – 2022-09-27 (×2): 400 mg via ORAL
  Filled 2022-09-26 (×4): qty 1

## 2022-09-26 NOTE — ED Provider Notes (Signed)
[facility] Provider Note    Event Date/Time   First MD Initiated Contact with Patient 09/26/22 [phone]     (approximate)   History   Chief Complaint Angioedema and Allergic Reaction   HPI  Dustin Guerra is a 67 y.o. male with past medical history of hypertension, diabetes, GERD, and iron deficiency anemia who presents to the ED complaining of allergic reaction.  Patient reports that he was feeling fine when he went to bed last night, was woken from sleep about an hour prior to arrival with diffuse itching and sensation of swelling in his throat with mild difficulty breathing.  Per EMS, he was incontinent of stool when he woke up, patient denies any nausea or vomiting, does report feeling lightheaded with the onset of symptoms.  EMS was called and noted some tongue swelling, administered 0.3 mg IM epinephrine along with 125 mg IV Solu-Medrol, 50 mg IV Benadryl, and 2 g IV magnesium.  On arrival to the ED, patient reports that breathing and sensation of swelling in his throat seem improved.     Physical Exam   Triage Vital Signs: ED Triage Vitals  Encounter Vitals Group     BP 09/26/22 0845 (!) 153/94     Systolic BP Percentile --      Diastolic BP Percentile --      Pulse Rate 09/26/22 0845 78     Resp 09/26/22 0845 19     Temp 09/26/22 0845 97.6 F (36.4 C)     Temp src --      SpO2 09/26/22 0845 100 %     Weight 09/26/22 0852 249 lb 9.6 oz (113.2 kg)     Height 09/26/22 0852 6\' 2"  (1.88 m)     Head Circumference --      Peak Flow --      Pain Score 09/26/22 0852 0     Pain Loc --      Pain Education --      Exclude from Growth Chart --     Most recent vital signs: Vitals:   09/26/22 1230 09/26/22 1315  BP: 137/68 132/74  Pulse: 66 61  Resp: 17 17  Temp:    SpO2: 98% 97%    Constitutional: Alert and oriented. Eyes: Conjunctivae are normal. Head: Atraumatic. Nose: No congestion/rhinnorhea. Mouth/Throat: Mucous membranes are  moist.  Mild tongue angioedema, no edema noted of the posterior oropharynx. Cardiovascular: Normal rate, regular rhythm. Grossly normal heart sounds.  2+ radial pulses bilaterally. Respiratory: Normal respiratory effort.  No retractions. Lungs CTAB. Gastrointestinal: Soft and nontender. No distention. Musculoskeletal: No lower extremity tenderness nor edema.  Neurologic:  Normal speech and language. No gross focal neurologic deficits are appreciated.    ED Results / Procedures / Treatments   Labs (all labs ordered are listed, but only abnormal results are displayed) Labs Reviewed - No data to display   PROCEDURES:  Critical Care performed: Yes, see critical care procedure note(s)  .Critical Care  Performed by: Chesley Noon, MD Authorized by: Chesley Noon, MD   Critical care provider statement:    Critical care time (minutes):  30   Critical care time was exclusive of:  Separately billable procedures and treating other patients and teaching time   Critical care was necessary to treat or prevent imminent or life-threatening deterioration of the following conditions: Angioedema.   Critical care was time spent personally by me on the following activities:  Development of treatment plan with patient or surrogate,  discussions with consultants, evaluation of patient's response to treatment, examination of patient, ordering and review of laboratory studies, ordering and review of radiographic studies, ordering and performing treatments and interventions, pulse oximetry, re-evaluation of patient's condition and review of old charts   I assumed direction of critical care for this patient from another provider in my specialty: no     Care discussed with: admitting provider      MEDICATIONS ORDERED IN ED: Medications  lidocaine (XYLOCAINE) 2 % viscous mouth solution 15 mL (has no administration in time range)  famotidine (PEPCID) IVPB 20 mg premix (0 mg Intravenous Stopped 09/26/22  0919)  sodium chloride 0.9 % bolus 1,000 mL (0 mLs Intravenous Stopped 09/26/22 1135)     IMPRESSION / MDM / ASSESSMENT AND PLAN / ED COURSE  I reviewed the triage vital signs and the nursing notes.                              67 y.o. male with past medical history of hypertension, diabetes, GERD, and iron deficiency anemia who presents to the ED complaining of diffuse itching, lightheadedness, throat swelling, and difficulty breathing since waking up this morning.  Patient's presentation is most consistent with acute presentation with potential threat to life or bodily function.  Differential diagnosis includes, but is not limited to, anaphylaxis, angioedema, ACE inhibitor induced angioedema.  Patient nontoxic-appearing and in no acute distress on arrival to the ED, vital signs are reassuring.  He has some mild edema of his tongue but no swelling noted in the posterior oropharynx.  He is breathing comfortably on room air, states symptoms improving following IM epinephrine.  I would favor anaphylaxis given his diffuse itching with episode of bowel incontinence, but he does take an ACE inhibitor which could have been a contributing factor.  We will give IV Pepcid and hydrate with IV fluids, observe closely here in the ED for any worsening symptoms.  After about 3 hours of observation, patient reported worsening sensation of throat swelling with ongoing muffled voice, on reassessment does appear to have mild edema of his uvula and soft palate.  Case discussed with Dr. Willeen Cass of ENT, who performed nasopharyngeal laryngoscopy, no edema noted around the vocal cords.  We will hold off on repeat epinephrine, but patient would benefit from further observation in the hospital to ensure his angioedema improves.  Case discussed with hospitalist for admission.      FINAL CLINICAL IMPRESSION(S) / ED DIAGNOSES   Final diagnoses:  Angioedema, initial encounter  Anaphylaxis, initial encounter     Rx  / DC Orders   ED Discharge Orders     None        Note:  This document was prepared using Dragon voice recognition software and may include unintentional dictation errors.   Chesley Noon, MD 09/26/22 [phone]

## 2022-09-26 NOTE — ED Notes (Signed)
Pt states he feels like the swelling in his tongue has improved and almost back to normal.

## 2022-09-26 NOTE — H&P (Signed)
History and Physical    Dustin Guerra ZOX:[id number] DOB: May 19, 1955 DOA: 09/26/2022  Referring MD/NP/PA:   PCP: Malva Limes, MD   Patient coming from:  The patient is coming from home.     Chief Complaint: Allergic reaction  HPI: Dustin Guerra is a 67 y.o. male with medical history significant of hypertension, hyperlipidemia, prediabetes, PVD, CAD, GERD, anxiety, OSA on CPAP, peripheral neuropathy, who presents with allergic reaction.  Patient states that he woke up this morning around 0700, when he started feeling itchy all over, noticed hives in both arms and legs.  He also has swelling to his face, throat, tongue, with difficulty swallowing and mild shortness of breath.  Patient denies chest pain, cough, fever or chills. Per EMS, he was incontinent of stool when he woke up.  He denies nausea vomiting or abdominal pain.  No symptoms of UTI. He has lightheadedness. Pt report right shoulder pain. He denies fall or inury.   Pt was given 50mg  of benadryl iv, 2grams of magnesium sulfate iv, and 125mg  of solumedrol iv by ems. 0.3 of epi was also administered at 0830.  Patient's hives have resolved.  He feels better generally.   Data reviewed independently and ED Course: pt was found to have WBC 7.0, GFR> 60, temperature normal, blood pressure 127/72, heart rate 78, RR 20, oxygen saturation 97% on room air.  Patient is placed on PCU for observation.  Dr. Willeen Cass of ENT is consulted.   EKG: I have personally reviewed.  Sinus rhythm, QTc 443, low voltage, occasional PAC   Review of Systems:   General: no fevers, chills, no body weight gain, has fatigue HEENT: no blurry vision, hearing changes or sore throat.  Has swelling to face, throat, tongue Respiratory: has dyspnea, no coughing, wheezing CV: no chest pain, no palpitations GI: no nausea, vomiting, abdominal pain, diarrhea, constipation GU: no dysuria, burning on urination, increased urinary frequency, hematuria   Ext: no leg edema Neuro: no unilateral weakness, numbness, or tingling, no vision change or hearing loss Skin: no rash, no skin tear. MSK: No muscle spasm, no deformity, no limitation of range of movement in spin.  Has right shoulder pain. Heme: No easy bruising.  Travel history: No recent long distant travel.   Allergy:  Allergies  Allergen Reactions   Bee Venom Itching and Swelling   Ibuprofen     No ibuprofen per pt due to another drug he takes     Past Medical History:  Diagnosis Date   Anxiety    Arthritis    Charcot's joint    left ankle   Diabetes mellitus without complication (HCC)    GERD (gastroesophageal reflux disease)    Hypertension    Neuropathy    bilateral feet per pt   Plantar fasciitis 08/04/2015   PONV (postoperative nausea and vomiting)    Sleep apnea     Past Surgical History:  Procedure Laterality Date   BACK SURGERY     COLON SURGERY  2015   Colon resection due to diverticulosis   COLONOSCOPY WITH PROPOFOL N/A 03/20/2022   Procedure: COLONOSCOPY WITH PROPOFOL;  Surgeon: Wyline Mood, MD;  Location: [facility] ENDOSCOPY;  Service: Gastroenterology;  Laterality: N/A;   COLONOSCOPY WITH PROPOFOL N/A 03/21/2022   Procedure: COLONOSCOPY WITH PROPOFOL;  Surgeon: Wyline Mood, MD;  Location: [facility] ENDOSCOPY;  Service: Gastroenterology;  Laterality: N/A;   ESOPHAGOGASTRODUODENOSCOPY (EGD) WITH PROPOFOL N/A 03/20/2022   Procedure: ESOPHAGOGASTRODUODENOSCOPY (EGD) WITH PROPOFOL;  Surgeon: Wyline Mood, MD;  Location:  [facility] ENDOSCOPY;  Service: Gastroenterology;  Laterality: N/A;   ESOPHAGOGASTRODUODENOSCOPY (EGD) WITH PROPOFOL N/A 03/21/2022   Procedure: ESOPHAGOGASTRODUODENOSCOPY (EGD) WITH PROPOFOL;  Surgeon: Wyline Mood, MD;  Location: [facility] ENDOSCOPY;  Service: Gastroenterology;  Laterality: N/A;   HERNIA REPAIR     VEIN LIGATION AND STRIPPING     VEIN SURGERY     Vein stripping at age 65-25   VENTRAL HERNIA REPAIR N/A 11/22/2020   Procedure: OPEN VENTRAL  INCISIONAL HERNIA REPAIR WITH MESH PATCH;  Surgeon: Darnell Level, MD;  Location: [facility] ORS;  Service: General;  Laterality: N/A;    Social History:  reports that he quit smoking about 24 years ago. His smoking use included cigarettes. He started smoking about 34 years ago. He has a 15 pack-year smoking history. He has never been exposed to tobacco smoke. He quit smokeless tobacco use about 9 years ago.  His smokeless tobacco use included chew. He reports current alcohol use of about 10.0 standard drinks of alcohol per week. He reports that he does not use drugs.  Family History:  Family History  Problem Relation Age of Onset   Alcohol abuse Mother    Esophageal cancer Father    Cancer Father    Heart failure Brother      Prior to Admission medications   Medication Sig Start Date End Date Taking? Authorizing Provider  amLODipine (NORVASC) 5 MG tablet Take 1 tablet (5 mg total) by mouth every evening. 06/23/22   Malva Limes, MD  Ascorbic Acid (VITAMIN C) 1000 MG tablet Take 1,000 mg by mouth daily.    [provider]  Azelaic Acid 15 % FOAM Apply topically. 11/25/19   [provider]  Cholecalciferol (VITAMIN D3) 50 MCG (2000 UT) TABS Take 1 tablet by mouth daily.    [provider]  cilostazol (PLETAL) 50 MG tablet TAKE ONE TABLET BY MOUTH TWICE A DAY 06/13/22   Malva Limes, MD  diazepam (VALIUM) 10 MG tablet TAKE 1 TABLET BY MOUTH DAILY AS NEEDED FOR ANXIETY 08/21/22   Malva Limes, MD  diclofenac Sodium (VOLTAREN) 1 % GEL Apply 2 g topically 4 (four) times daily. 01/03/20   Chrismon, Jodell Cipro, PA-C  EPINEPHrine 0.3 mg/0.3 mL IJ SOAJ injection as needed.  08/05/12   [provider]  hydrochlorothiazide (HYDRODIURIL) 12.5 MG tablet TAKE ONE TABLET BY MOUTH DAILY 08/26/21   Malva Limes, MD  HYDROcodone-acetaminophen [facility]) 7.5-325 MG tablet 1/2-1 tablet up to four times a day as needed 09/18/22   Malva Limes, MD  hydrOXYzine (VISTARIL) 25 MG  capsule Take 1 capsule (25 mg total) by mouth every 8 (eight) hours as needed. 01/06/22   Malva Limes, MD  lansoprazole (PREVACID) 30 MG capsule TAKE ONE CAPSULE BY MOUTH DAILY AT 12 NOON 08/21/22   Malva Limes, MD  lisinopril (ZESTRIL) 10 MG tablet TAKE 1 TABLET BY MOUTH DAILY 08/19/22   Malva Limes, MD  magnesium oxide (MAG-OX) 400 MG tablet Take 1 tablet by mouth daily.    [provider]  meclizine (ANTIVERT) 25 MG tablet Take 1 tablet (25 mg total) by mouth 3 (three) times daily as needed for dizziness. 03/12/20   Malva Limes, MD  meloxicam (MOBIC) 15 MG tablet Take 1 tablet (15 mg total) by mouth daily as needed. 08/12/22   Malva Limes, MD  pravastatin (PRAVACHOL) 40 MG tablet TAKE 1 TABLET BY MOUTH DAILY 09/08/22   Malva Limes, MD  pregabalin (LYRICA) 225  MG capsule TAKE ONE CAPSULE BY MOUTH THREE TIMES A DAY 09/25/22   Malva Limes, MD  psyllium (METAMUCIL) 58.6 % powder Take 1 packet by mouth 3 (three) times daily. Large spoonful in a cup of water per day    [provider]  selenium 200 MCG TABS tablet Take 200 mcg by mouth daily.    [provider]  sildenafil (VIAGRA) 100 MG tablet TAKE 1/2 TO 1 TABLET BY MOUTH DAILY AS NEEDED FOR ERECTILE DYSFUNCTION 12/27/21   Malva Limes, MD  silver sulfADIAZINE (SILVADENE) 1 % cream Apply 1 Application topically daily. 11/29/21   Felecia Shelling, DPM  traMADol (ULTRAM) 50 MG tablet TAKE 1 TABLET BY MOUTH THREE TIMES A DAY AS NEEDED 08/21/22   Malva Limes, MD  vitamin B-12 (CYANOCOBALAMIN) 500 MCG tablet Take 500 mcg by mouth daily.    [provider]  vitamin E 180 MG (400 UNITS) capsule Take 400 Units by mouth daily.    [provider]  Zinc Sulfate 220 (50 Zn) MG TABS Take 1 tablet by mouth daily.    [provider]    Physical Exam: Vitals:   09/26/22 1230 09/26/22 1315 09/26/22 1330 09/26/22 1345  BP: 137/68 132/74 127/72 127/78  Pulse: 66 61 72 64  Resp:  17 17 20 18   Temp:      SpO2: 98% 97% 97% 96%  Weight:      Height:       General: Not in acute distress HEENT: has mild swelling to tongue, lips and face, no edema noted of the posterior oropharynx.        Eyes: PERRL, EOMI, no jaundice       ENT: No discharge from the ears and nose, no pharynx injection, no tonsillar enlargement.        Neck: No JVD, no bruit, no mass felt. Heme: No neck lymph node enlargement. Cardiac: S1/S2, RRR, No murmurs, No gallops or rubs. Respiratory: No rales, wheezing, rhonchi or rubs. GI: Soft, nondistended, nontender, no rebound pain, no organomegaly, BS present. GU: No hematuria Ext: No pitting leg edema bilaterally. 1+DP/PT pulse bilaterally. Musculoskeletal: No joint deformities, No joint redness or warmth, no limitation of ROM in spin. Has tenderness to right shoulder and right clavicle Skin: No rashes.  Neuro: sleepy (received Benadryl), oriented X3, cranial nerves II-XII grossly intact, moves all extremities normally. Psych: Patient is not psychotic, no suicidal or hemocidal ideation.  Labs on Admission: I have personally reviewed following labs and imaging studies  CBC: Recent Labs  Lab 09/26/22 0848  WBC 7.0  NEUTROABS 3.8  HGB 14.0  HCT 40.6  MCV 96.4  PLT 162   Basic Metabolic Panel: Recent Labs  Lab 09/26/22 0848  NA 139  K 3.5  CL 103  CO2 26  GLUCOSE 169*  BUN 17  CREATININE 1.07  CALCIUM 9.0   GFR: Estimated Creatinine Clearance: 92.1 mL/min (by C-G formula based on SCr of 1.07 mg/dL). Liver Function Tests: No results for input(s): "AST", "ALT", "ALKPHOS", "BILITOT", "PROT", "ALBUMIN" in the last 168 hours. No results for input(s): "LIPASE", "AMYLASE" in the last 168 hours. No results for input(s): "AMMONIA" in the last 168 hours. Coagulation Profile: No results for input(s): "INR", "PROTIME" in the last 168 hours. Cardiac Enzymes: No results for input(s): "CKTOTAL", "CKMB", "CKMBINDEX", "TROPONINI" in the last 168  hours. BNP (last 3 results) No results for input(s): "PROBNP" in the last 8760 hours. HbA1C: No results for input(s): "HGBA1C" in  the last 72 hours. CBG: No results for input(s): "GLUCAP" in the last 168 hours. Lipid Profile: No results for input(s): "CHOL", "HDL", "LDLCALC", "TRIG", "CHOLHDL", "LDLDIRECT" in the last 72 hours. Thyroid Function Tests: No results for input(s): "TSH", "T4TOTAL", "FREET4", "T3FREE", "THYROIDAB" in the last 72 hours. Anemia Panel: No results for input(s): "VITAMINB12", "FOLATE", "FERRITIN", "TIBC", "IRON", "RETICCTPCT" in the last 72 hours. Urine analysis:    Component Value Date/Time   COLORURINE Yellow 06/03/2012 1704   APPEARANCEUR Clear 12/13/2021 1106   LABSPEC 1.029 06/03/2012 1704   PHURINE 5.0 06/03/2012 1704   GLUCOSEU 2+ (A) 12/13/2021 1106   GLUCOSEU Negative 06/03/2012 1704   HGBUR Negative 06/03/2012 1704   BILIRUBINUR Negative 12/13/2021 1106   BILIRUBINUR Negative 06/03/2012 1704   KETONESUR Negative 06/03/2012 1704   PROTEINUR Trace 12/13/2021 1106   PROTEINUR Negative 06/03/2012 1704   NITRITE Negative 12/13/2021 1106   NITRITE Negative 06/03/2012 1704   LEUKOCYTESUR 2+ (A) 12/13/2021 1106   LEUKOCYTESUR Negative 06/03/2012 1704   Sepsis Labs: @LABRCNTIP (procalcitonin:4,lacticidven:4) )No results found for this or any previous visit (from the past 240 hour(s)).   Radiological Exams on Admission: No results found.    Assessment/Plan Principal Problem:   Angioedema Active Problems:   Primary hypertension   Anxiety   PVD (peripheral vascular disease) (HCC)   Hyperlipidemia   OSA on CPAP   Right shoulder pain   Obesity (BMI 30-39.9)   Assessment and Plan:  Angioedema: Patient symptoms are consistent with angioedema.  Patient's taking lisinopril which is likely the etiology. Pt was given 50mg  of benadryl iv, 2grams of magnesium sulfate iv, and 125mg  of solumedrol iv by ems. 0.3 of epi was also administered at 0830.   Patient's hives have resolved.  He feels better generally. Dr. Wardell Heath of ENT is consulted, who performed nasopharyngeal laryngoscopy, no edema noted around the vocal cords.    -Place in PCU for obs  -Monitor respiratory status carefully -Labs: C4, C3, C1 inhibitor level -Solu-Medrol 80 mg tid -IV Benadryl 25 mg bid -IV pepcid 20 mg bid -prn EpiPen for worsening tongue swelling and shortness of breath -IVF: 1 L normal saline, then 75 cc/h -discontine lisinopril  Primary hypertension -Discontinued lisinopril -Hold HCTZ since patient need IV fluid -IV hydralazine as needed -Amlodipine  Anxiety -Continue home Valium  PVD (peripheral vascular disease) (HCC) -Cilostazol  Hyperlipidemia -Pravastatin  OSA -on CPAP  Right shoulder pain: Etiology is not clear -As needed Tylenol -Follow-up x-ray of right shoulder  Obesity (BMI 30-39.9): Body weight 113.2 kg, BMI 32.05 -Encourage losing weight -Exercise and healthy diet        DVT ppx: SQ Lovenox  Code Status: Full code    Family Communication:  Yes, patient's girlfriend  at bed side.     Disposition Plan:  Anticipate discharge back to previous environment  Consults called:  Dr. Willeen Cass of ENT is consulted.  Admission status and Level of care: Progressive:     for obs   Dispo: The patient is from: Home              Anticipated d/c is to: Home              Anticipated d/c date is: 1 day              Patient currently is not medically stable to d/c.    Severity of Illness:  The appropriate patient status for this patient is INPATIENT. Inpatient status is judged to be reasonable and necessary in  order to provide the required intensity of service to ensure the patient's safety. The patient's presenting symptoms, physical exam findings, and initial radiographic and laboratory data in the context of their chronic comorbidities is felt to place them at high risk for further clinical deterioration. Furthermore, it is not  anticipated that the patient will be medically stable for discharge from the hospital within 2 midnights of admission.   * I certify that at the point of admission it is my clinical judgment that the patient will require inpatient hospital care spanning beyond 2 midnights from the point of admission due to high intensity of service, high risk for further deterioration and high frequency of surveillance required.*       Date of Service 09/26/2022    Lorretta Harp [facility]   If 7PM-7AM, please contact night-coverage [email or web address] 09/26/2022, 3:21 PM

## 2022-09-26 NOTE — ED Notes (Signed)
Pt awake and alert. Eating lunch, no complications

## 2022-09-26 NOTE — ED Triage Notes (Addendum)
Pt arrives via EMS from home. Pt woke up this morning around 0700 with swelling to face, throat, and tongue, hives, and itching all over. Pt reports difficulty swallowing and some mild difficulty breathing. Airway is patent, patient is able to speak in full sentences. Pt was given 50mg  of benadryl iv, 2grams of mag iv, and 125mg  of solumedrol iv by ems. 0.3 of epi was also administered at 0830. Pt is AxOx4.

## 2022-09-26 NOTE — Consult Note (Signed)
Dustin Guerra, Dustin Guerra [id number] 1956/02/21 Sandi Mealy, MD  Reason for Consult: assess airway  Requesting Physician: Chesley Noon, MD Consulting Physician: Sandi Mealy  HPI: This 67 y.o. year old male was admitted on 09/26/2022 for angioedema, ems. 67 y.o. male with past medical history of hypertension, diabetes, GERD, and iron deficiency anemia who presents to the ED complaining of allergic reaction.  Patient reports that he was feeling fine when he went to bed last night, was woken from sleep about an hour prior to arrival with diffuse itching and sensation of swelling in his throat with mild difficulty breathing.  He was "red all over" per family. Had not eaten anything unusual, and no known food allergy. He is on lisinopril for a few months. Per EMS, he was incontinent of stool when he woke up, patient denies any nausea or vomiting, does report feeling lightheaded with the onset of symptoms.  EMS was called and noted some tongue swelling, administered 0.3 mg IM epinephrine along with 125 mg IV Solu-Medrol, 50 mg IV Benadryl, and 2 g IV magnesium.  On arrival to the ED, patient reports that breathing and sensation of swelling in his throat seem improved, but told ER physician later he might be a little worse. Main complaint as far as breathing is that nose closes off some as he tries to breathe through nose, but says he breathes fine through mouth.   Allergies:  Allergies  Allergen Reactions   Bee Venom Itching and Swelling   Ibuprofen     No ibuprofen per pt due to another drug he takes     Medications: (Not in a hospital admission) .  Current Facility-Administered Medications  Medication Dose Route Frequency Provider Last Rate Last Admin   lidocaine (XYLOCAINE) 2 % viscous mouth solution 15 mL  15 mL Mouth/Throat Once Chesley Noon, MD       Current Outpatient Medications  Medication Sig Dispense Refill   amLODipine (NORVASC) 5 MG tablet Take 1 tablet (5 mg total) by mouth every  evening.     Ascorbic Acid (VITAMIN C) 1000 MG tablet Take 1,000 mg by mouth daily.     Azelaic Acid 15 % FOAM Apply topically.     Cholecalciferol (VITAMIN D3) 50 MCG (2000 UT) TABS Take 1 tablet by mouth daily.     cilostazol (PLETAL) 50 MG tablet TAKE ONE TABLET BY MOUTH TWICE A DAY 180 tablet 4   diazepam (VALIUM) 10 MG tablet TAKE 1 TABLET BY MOUTH DAILY AS NEEDED FOR ANXIETY 15 tablet 3   diclofenac Sodium (VOLTAREN) 1 % GEL Apply 2 g topically 4 (four) times daily. 50 g 3   EPINEPHrine 0.3 mg/0.3 mL IJ SOAJ injection as needed.      hydrochlorothiazide (HYDRODIURIL) 12.5 MG tablet TAKE ONE TABLET BY MOUTH DAILY 90 tablet 4   HYDROcodone-acetaminophen (NORCO) 7.5-325 MG tablet 1/2-1 tablet up to four times a day as needed 120 tablet 0   hydrOXYzine (VISTARIL) 25 MG capsule Take 1 capsule (25 mg total) by mouth every 8 (eight) hours as needed. 15 capsule 0   lansoprazole (PREVACID) 30 MG capsule TAKE ONE CAPSULE BY MOUTH DAILY AT 12 NOON 90 capsule 4   lisinopril (ZESTRIL) 10 MG tablet TAKE 1 TABLET BY MOUTH DAILY 90 tablet 1   magnesium oxide (MAG-OX) 400 MG tablet Take 1 tablet by mouth daily.     meclizine (ANTIVERT) 25 MG tablet Take 1 tablet (25 mg total) by mouth 3 (three) times daily as needed for  dizziness. 30 tablet 0   meloxicam (MOBIC) 15 MG tablet Take 1 tablet (15 mg total) by mouth daily as needed. 90 tablet 2   pravastatin (PRAVACHOL) 40 MG tablet TAKE 1 TABLET BY MOUTH DAILY 90 tablet 0   pregabalin (LYRICA) 225 MG capsule TAKE ONE CAPSULE BY MOUTH THREE TIMES A DAY 270 capsule 1   psyllium (METAMUCIL) 58.6 % powder Take 1 packet by mouth 3 (three) times daily. Large spoonful in a cup of water per day     selenium 200 MCG TABS tablet Take 200 mcg by mouth daily.     sildenafil (VIAGRA) 100 MG tablet TAKE 1/2 TO 1 TABLET BY MOUTH DAILY AS NEEDED FOR ERECTILE DYSFUNCTION 10 tablet 2   silver sulfADIAZINE (SILVADENE) 1 % cream Apply 1 Application topically daily. 50 g 1    traMADol (ULTRAM) 50 MG tablet TAKE 1 TABLET BY MOUTH THREE TIMES A DAY AS NEEDED 90 tablet 3   vitamin B-12 (CYANOCOBALAMIN) 500 MCG tablet Take 500 mcg by mouth daily.     vitamin E 180 MG (400 UNITS) capsule Take 400 Units by mouth daily.     Zinc Sulfate 220 (50 Zn) MG TABS Take 1 tablet by mouth daily.      PMH:  Past Medical History:  Diagnosis Date   Anxiety    Arthritis    Charcot's joint    left ankle   Diabetes mellitus without complication (HCC)    GERD (gastroesophageal reflux disease)    Hypertension    Neuropathy    bilateral feet per pt   Plantar fasciitis 08/04/2015   PONV (postoperative nausea and vomiting)    Sleep apnea     Fam Hx:  Family History  Problem Relation Age of Onset   Alcohol abuse Mother    Esophageal cancer Father    Cancer Father    Heart failure Brother     Soc Hx:  Social History   Socioeconomic History   Marital status: Divorced    Spouse name: Not on file   Number of children: Not on file   Years of education: Not on file   Highest education level: 12th grade  Occupational History   Not on file  Tobacco Use   Smoking status: Former    Current packs/day: 0.00    Average packs/day: 1.5 packs/day for 10.0 years (15.0 ttl pk-yrs)    Types: Cigarettes    Start date: 08/14/1988    Quit date: 08/15/1998    Years since quitting: 24.1    Passive exposure: Never   Smokeless tobacco: Former    Types: Chew    Quit date: 2015   Tobacco comments:    quit 2000, smoked about 1.5 packs for about 5 years  Vaping Use   Vaping status: Never Used  Substance and Sexual Activity   Alcohol use: Yes    Alcohol/week: 10.0 standard drinks of alcohol    Types: 10 Glasses of wine per week    Comment: Red wine before bed   Drug use: No   Sexual activity: Not Currently    Birth control/protection: Condom  Other Topics Concern   Not on file  Social History Narrative   Not on file   Social Determinants of Health   Financial Resource Strain:  Low Risk  (07/19/2022)   Overall Financial Resource Strain (CARDIA)    Difficulty of Paying Living Expenses: Not very hard  Food Insecurity: No Food Insecurity (07/19/2022)   Hunger Vital Sign  Worried About Programme researcher, broadcasting/film/video in the Last Year: Never true    Ran Out of Food in the Last Year: Never true  Transportation Needs: No Transportation Needs (07/19/2022)   PRAPARE - Administrator, Civil Service (Medical): No    Lack of Transportation (Non-Medical): No  Physical Activity: Sufficiently Active (07/19/2022)   Exercise Vital Sign    Days of Exercise per Week: 5 days    Minutes of Exercise per Session: 90 min  Stress: No Stress Concern Present (07/19/2022)   [employer] of Occupational Health - Occupational Stress Questionnaire    Feeling of Stress : Not at all  Social Connections: Unknown (07/19/2022)   Social Connection and Isolation Panel [NHANES]    Frequency of Communication with Friends and Family: Patient declined    Frequency of Social Gatherings with Friends and Family: Three times a week    Attends Religious Services: Patient declined    Active Member of Clubs or Organizations: Patient declined    Attends Banker Meetings: Never    Marital Status: Divorced  Catering manager Violence: Not At Risk (06/10/2022)   Humiliation, Afraid, Rape, and Kick questionnaire    Fear of Current or Ex-Partner: No    Emotionally Abused: No    Physically Abused: No    Sexually Abused: No    PSH:  Past Surgical History:  Procedure Laterality Date   BACK SURGERY     COLON SURGERY  2015   Colon resection due to diverticulosis   COLONOSCOPY WITH PROPOFOL N/A 03/20/2022   Procedure: COLONOSCOPY WITH PROPOFOL;  Surgeon: Wyline Mood, MD;  Location: [facility] ENDOSCOPY;  Service: Gastroenterology;  Laterality: N/A;   COLONOSCOPY WITH PROPOFOL N/A 03/21/2022   Procedure: COLONOSCOPY WITH PROPOFOL;  Surgeon: Wyline Mood, MD;  Location: [facility] ENDOSCOPY;  Service:  Gastroenterology;  Laterality: N/A;   ESOPHAGOGASTRODUODENOSCOPY (EGD) WITH PROPOFOL N/A 03/20/2022   Procedure: ESOPHAGOGASTRODUODENOSCOPY (EGD) WITH PROPOFOL;  Surgeon: Wyline Mood, MD;  Location: [facility] ENDOSCOPY;  Service: Gastroenterology;  Laterality: N/A;   ESOPHAGOGASTRODUODENOSCOPY (EGD) WITH PROPOFOL N/A 03/21/2022   Procedure: ESOPHAGOGASTRODUODENOSCOPY (EGD) WITH PROPOFOL;  Surgeon: Wyline Mood, MD;  Location: [facility] ENDOSCOPY;  Service: Gastroenterology;  Laterality: N/A;   HERNIA REPAIR     VEIN LIGATION AND STRIPPING     VEIN SURGERY     Vein stripping at age 13-25   VENTRAL HERNIA REPAIR N/A 11/22/2020   Procedure: OPEN VENTRAL INCISIONAL HERNIA REPAIR WITH MESH PATCH;  Surgeon: Darnell Level, MD;  Location: [facility] ORS;  Service: General;  Laterality: N/A;  . Procedures since admission: No admission procedures for hospital encounter.  ROS: Review of systems normal other than 12 systems except per HPI.  PHYSICAL EXAM Vitals:  Vitals:   09/26/22 0845  BP: (!) 153/94  Pulse: 78  Resp: 19  Temp: 97.6 F (36.4 C)  SpO2: 100%  . General: Well-developed, Well-nourished in no acute distress Mood: Mood and affect well adjusted, pleasant and cooperative. Orientation: Grossly alert and oriented. Vocal Quality: No hoarseness. Communicates verbally. head and Face: NCAT. No facial asymmetry. No visible skin lesions. No significant facial scars. No tenderness with sinus percussion. Facial strength normal and symmetric. Ears: External ears with normal landmarks, no lesions. External auditory canals free of infection, cerumen impaction or lesions. Tympanic membranes intact with good landmarks and normal mobility on pneumatic otoscopy. No middle ear effusion. Hearing: Speech reception grossly normal. Nose: External nose normal with midline dorsum and no lesions or deformity. Nasal Cavity reveals essentially midline  septum with normal inferior turbinates. No significant mucosal congestion or  erythema. Nasal secretions are minimal and clear. No polyps seen on anterior rhinoscopy. Oral Cavity/ Oropharynx: Lips are normal with no lesions. Teeth no frank dental caries. Gingiva healthy with no lesions or gingivitis. Oropharynx including tongue, buccal mucosa, floor of mouth, hard and soft palate, uvula and posterior pharynx free of exudates, erythema or lesions with normal symmetry. Maybe some mild tongue and uvular edema. Floor of mouth not swollen.   Indirect Laryngoscopy/Nasopharyngoscopy: Visualization of the larynx, hypopharynx and nasopharynx is not possible in this setting with routine examination. Neck: Supple and symmetric with no palpable masses, tenderness or crepitance. The trachea is midline. Thyroid gland is soft, nontender and symmetric with no masses or enlargement. Parotid and submandibular glands are soft, nontender and symmetric, without masses. Lymphatic: Cervical lymph nodes are without palpable lymphadenopathy or tenderness. Respiratory: Normal respiratory effort without labored breathing. No stridor. Lying recumbent with head elevated and breathing comfortably/quietly.  Cardiovascular: Carotid pulse shows regular rate and rhythm Neurologic: Cranial Nerves II through XII are grossly intact. Eyes: Gaze and Ocular Motility are grossly normal. PERRLA. No visible nystagmus.  MEDICAL DECISION MAKING: Data Review: No results found for this or any previous visit (from the past 48 hour(s)).Marland Kitchen No results found.Marland Kitchen   PROCEDURE: Procedure: Diagnostic Fiberoptic Nasolaryngoscopy Diagnosis: Angioedema Indications: assess airway Findings:Mild posterior glottic edema, most likely due to reflux, but no edema of epiglottis or false cords. Airway patent. Vocal cords clear and mobile. No significant tongue base edema. Description of Procedure: After discussing procedure and risks  (primarily nose bleed) with the patient, the nose was anesthetized with topical Lidocaine gel. A flexible  fiberoptic scope was passed through the left nasal cavity. The nasal cavity was inspected and the scope passed through the Nasopharynx to the region of the hypopharynx and larynx. The patient was instructed to phonate to assess vocal cord mobility. The tongue was extended to evaluate the tongue base completely. . Findings are as noted above. The scope was withdrawn. The patient tolerated the procedure well.  ASSESSMENT: Angioedema versus allergic reaction. Airway currently appears patent with only mild tongue/uvular edema and no respiratory distress.   PLAN: Continue supportive care, steroids, antihistamines, elevate head of bed. Ice water may help with mild uvular edema. Would discontinue Lisinopril given concern for angioedema. Outpatient evaluation for possible food allergy could be considered, though cause of this remains uncertain. Itching less common with angioedema. Would also suggest providing an Epipen prescription to have available. Continue observation in ER until improved, or admission with pulse ox monitoring if not making more rapid progress. Reconsult if symptoms worsen significantly   Sandi Mealy, MD 09/26/2022 12:39 PM

## 2022-09-26 NOTE — ED Notes (Signed)
Pt reassessed, pt reports he feels like his tongue is swelling again. Airway remains patent. Dr. Larinda Buttery informed.

## 2022-09-27 DIAGNOSIS — F419 Anxiety disorder, unspecified: Secondary | ICD-10-CM | POA: Diagnosis not present

## 2022-09-27 DIAGNOSIS — E782 Mixed hyperlipidemia: Secondary | ICD-10-CM

## 2022-09-27 DIAGNOSIS — E669 Obesity, unspecified: Secondary | ICD-10-CM | POA: Diagnosis not present

## 2022-09-27 DIAGNOSIS — I10 Essential (primary) hypertension: Secondary | ICD-10-CM | POA: Diagnosis not present

## 2022-09-27 DIAGNOSIS — G4733 Obstructive sleep apnea (adult) (pediatric): Secondary | ICD-10-CM | POA: Diagnosis not present

## 2022-09-27 DIAGNOSIS — T783XXA Angioneurotic edema, initial encounter: Secondary | ICD-10-CM | POA: Diagnosis not present

## 2022-09-27 DIAGNOSIS — I739 Peripheral vascular disease, unspecified: Secondary | ICD-10-CM | POA: Diagnosis not present

## 2022-09-27 LAB — CBC
HCT: 33.1 % — ABNORMAL LOW (ref 39.0–52.0)
Hemoglobin: 11.4 g/dL — ABNORMAL LOW (ref 13.0–17.0)
MCH: 32.9 pg (ref 26.0–34.0)
MCHC: 34.4 g/dL (ref 30.0–36.0)
MCV: 95.7 fL (ref 80.0–100.0)
Platelets: 120 10*3/uL — ABNORMAL LOW (ref 150–400)
RBC: 3.46 MIL/uL — ABNORMAL LOW (ref 4.22–5.81)
RDW: 13.5 % (ref 11.5–15.5)
WBC: 5.5 10*3/uL (ref 4.0–10.5)
nRBC: 0 % (ref 0.0–0.2)

## 2022-09-27 LAB — HIV ANTIBODY (ROUTINE TESTING W REFLEX): HIV Screen 4th Generation wRfx: NONREACTIVE

## 2022-09-27 LAB — BASIC METABOLIC PANEL
Anion gap: 6 (ref 5–15)
BUN: 21 mg/dL (ref 8–23)
CO2: 25 mmol/L (ref 22–32)
Calcium: 8.7 mg/dL — ABNORMAL LOW (ref 8.9–10.3)
Chloride: 110 mmol/L (ref 98–111)
Creatinine, Ser: 1.04 mg/dL (ref 0.61–1.24)
GFR, Estimated: >60 mL/min (ref >60)
Glucose, Bld: 196 mg/dL — ABNORMAL HIGH (ref 70–99)
Potassium: 4.1 mmol/L (ref 3.5–5.1)
Sodium: 141 mmol/L (ref 135–145)

## 2022-09-27 LAB — CBG MONITORING, ED: Glucose-Capillary: 192 mg/dL — ABNORMAL HIGH (ref 70–99)

## 2022-09-27 MED ORDER — DIPHENHYDRAMINE HCL 25 MG PO CAPS: 25.0000 mg | ORAL_CAPSULE | Freq: Four times a day (QID) | ORAL | 0 refills | Status: AC | PRN

## 2022-09-27 MED ORDER — PREDNISONE 10 MG PO TABS: ORAL_TABLET | ORAL | 0 refills | Status: AC

## 2022-09-27 MED ORDER — HYDROXYZINE HCL 10 MG PO TABS: 25.0000 mg | ORAL_TABLET | Freq: Three times a day (TID) | ORAL | Status: DC | PRN

## 2022-09-27 NOTE — Discharge Summary (Signed)
Physician Discharge Summary   Patient: Dustin Guerra MRN: [id number] DOB: [birth year]  Admit date:     09/26/2022  Discharge date: 09/27/22  Discharge Physician: Marcelino Duster   PCP: Malva Limes, MD   Recommendations at discharge:    PCP 1 week. Allergy list updated for ACEI.  Discharge Diagnoses: Principal Problem:   Angioedema Active Problems:   Primary hypertension   Anxiety   PVD (peripheral vascular disease) (HCC)   Hyperlipidemia   OSA on CPAP   Right shoulder pain   Obesity (BMI 30-39.9)  Resolved Problems:   * No resolved hospital problems. *  Hospital Course: Dustin Guerra is a 67 y.o. male with medical history significant of hypertension, hyperlipidemia, prediabetes, PVD, CAD, GERD, anxiety, OSA on CPAP, peripheral neuropathy, who presents with allergic reaction.   Patient states that he woke up this morning around 0700, when he started feeling itchy all over, noticed hives in both arms and legs.  He also has swelling to his face, throat, tongue, with difficulty swallowing and mild shortness of breath.  Patient denies chest pain, cough, fever or chills. Per EMS, he was incontinent of stool when he woke up.  He denies nausea vomiting or abdominal pain.  No symptoms of UTI. He has lightheadedness. Pt report right shoulder pain. He denies fall or inury.    Pt was given 50mg  of benadryl iv, 2grams of magnesium sulfate iv, and 125mg  of solumedrol iv by ems. 0.3 of epi was also administered at 0830.  Patient's hives have resolved.  He feels better generally.  Patient is seen by ENT perform nasopharyngeal laryngoscopy did not show any evidence of edema around the vocal cords.  Admitted under observation to hospitalist service.  Today morning he feels much better, itching improved, able to swallow, no shortness of breath wishes to go home.  I advised him to continue taking oral prednisone, PPI, Benadryl.  His allergy list is updated.  Advised to  follow-up with primary care physician upon discharge as instructed.  He understands and agrees with the discharge plan.       Consultants: ENT  Procedures performed: Nasopharyngeal laryngoscopy Disposition: Home Diet recommendation:  Discharge Diet Orders (From admission, onward)     Start     Ordered   09/27/22 0000  Diet - low sodium heart healthy        09/27/22 1148           Cardiac diet DISCHARGE MEDICATION: Allergies as of 09/27/2022       Reactions   Bee Venom Itching, Swelling   Ibuprofen    No ibuprofen per pt due to another drug he takes    Lisinopril Swelling   angioedema        Medication List     TAKE these medications    amLODipine 5 MG tablet Commonly known as: NORVASC Take 1 tablet (5 mg total) by mouth every evening.   Azelaic Acid 15 % Foam Apply topically.   cilostazol 50 MG tablet Commonly known as: PLETAL TAKE ONE TABLET BY MOUTH TWICE A DAY   diazepam 10 MG tablet Commonly known as: VALIUM TAKE 1 TABLET BY MOUTH DAILY AS NEEDED FOR ANXIETY   diclofenac Sodium 1 % Gel Commonly known as: VOLTAREN Apply 2 g topically 4 (four) times daily.   diphenhydrAMINE 25 mg capsule Commonly known as: Benadryl Allergy Take 1 capsule (25 mg total) by mouth every 6 (six) hours as needed for itching or allergies.   EPINEPHrine 0.3  mg/0.3 mL Soaj injection Commonly known as: EPI-PEN as needed.   hydrochlorothiazide 12.5 MG tablet Commonly known as: HYDRODIURIL TAKE ONE TABLET BY MOUTH DAILY   HYDROcodone-acetaminophen 7.5-325 MG tablet Commonly known as: NORCO 1/2-1 tablet up to four times a day as needed   hydrOXYzine 25 MG capsule Commonly known as: VISTARIL Take 1 capsule (25 mg total) by mouth every 8 (eight) hours as needed.   lansoprazole 30 MG capsule Commonly known as: PREVACID TAKE ONE CAPSULE BY MOUTH DAILY AT 12 NOON   magnesium oxide 400 MG tablet Commonly known as: MAG-OX Take 1 tablet by mouth daily.   meclizine 25  MG tablet Commonly known as: ANTIVERT Take 1 tablet (25 mg total) by mouth 3 (three) times daily as needed for dizziness.   meloxicam 15 MG tablet Commonly known as: MOBIC Take 1 tablet (15 mg total) by mouth daily as needed.   polyethylene glycol powder 17 GM/SCOOP powder Commonly known as: GLYCOLAX/MIRALAX Take 17 g by mouth as needed for mild constipation.   pravastatin 40 MG tablet Commonly known as: PRAVACHOL TAKE 1 TABLET BY MOUTH DAILY   predniSONE 10 MG tablet Commonly known as: DELTASONE Take 4 tablets (40 mg total) by mouth daily for 2 days, THEN 2 tablets (20 mg total) daily for 2 days. Start taking on: September 27, 2022   pregabalin 225 MG capsule Commonly known as: LYRICA TAKE ONE CAPSULE BY MOUTH THREE TIMES A DAY   psyllium 58.6 % powder Commonly known as: METAMUCIL Take 1 packet by mouth 3 (three) times daily. Large spoonful in a cup of water per day   selenium 200 MCG Tabs tablet Take 200 mcg by mouth daily.   sildenafil 100 MG tablet Commonly known as: VIAGRA TAKE 1/2 TO 1 TABLET BY MOUTH DAILY AS NEEDED FOR ERECTILE DYSFUNCTION   silver sulfADIAZINE 1 % cream Commonly known as: Silvadene Apply 1 Application topically daily.   traMADol 50 MG tablet Commonly known as: ULTRAM TAKE 1 TABLET BY MOUTH THREE TIMES A DAY AS NEEDED   vitamin B-12 500 MCG tablet Commonly known as: CYANOCOBALAMIN Take 500 mcg by mouth daily.   vitamin C 1000 MG tablet Take 1,000 mg by mouth daily.   Vitamin D3 50 MCG (2000 UT) Tabs Take 1 tablet by mouth daily.   vitamin E 180 MG (400 UNITS) capsule Take 400 Units by mouth daily.   Zinc Sulfate 220 (50 Zn) MG Tabs Take 1 tablet by mouth daily.        Discharge Exam: Filed Weights   09/26/22 0852  Weight: 113.2 kg   General - Elderly obese Caucasian male, no apparent distress HEENT - PERRLA, EOMI, atraumatic head, ruddy appearing face Lung - Clear, rales, rhonchi, wheezes. Heart - S1, S2 heard, no murmurs,  rubs, trace pedal edema Neuro - Alert, awake and oriented x 3, non focal exam. Skin - Warm and dry.  Condition at discharge: stable  The results of significant diagnostics from this hospitalization (including imaging, microbiology, ancillary and laboratory) are listed below for reference.   Imaging Studies: DG Shoulder Right  Result Date: 09/26/2022 CLINICAL DATA:  Right shoulder pain EXAM: RIGHT SHOULDER - 2+ VIEW COMPARISON:  None Available. FINDINGS: There is no evidence of fracture or dislocation. Moderate acromioclavicular osteoarthritis. Mild glenohumeral arthropathy. Soft tissues are unremarkable. IMPRESSION: Moderate acromioclavicular and mild glenohumeral osteoarthritis. No acute findings. Electronically Signed   By: Duanne Guess D.O.   On: 09/26/2022 15:47    Microbiology: Results for orders  placed or performed in visit on 02/12/22  Chlamydia/Gonococcus/Trichomonas, NAA     Status: None   Collection Time: 02/12/22 12:00 AM  Result Value Ref Range Status   Chlamydia by NAA Negative Negative Final   Gonococcus by NAA Negative Negative Final   Trich vag by NAA Negative Negative Final    Labs: CBC: Recent Labs  Lab 09/26/22 0848 09/27/22 0630  WBC 7.0 5.5  NEUTROABS 3.8  --   HGB 14.0 11.4*  HCT 40.6 33.1*  MCV 96.4 95.7  PLT 162 120*   Basic Metabolic Panel: Recent Labs  Lab 09/26/22 0848 09/27/22 0630  NA 139 141  K 3.5 4.1  CL 103 110  CO2 26 25  GLUCOSE 169* 196*  BUN 17 21  CREATININE 1.07 1.04  CALCIUM 9.0 8.7*   Liver Function Tests: No results for input(s): "AST", "ALT", "ALKPHOS", "BILITOT", "PROT", "ALBUMIN" in the last 168 hours. CBG: Recent Labs  Lab 09/26/22 1651 09/26/22 2102 09/27/22 0840  GLUCAP 267* 216* 192*    Discharge time spent: greater than 30 minutes.  Signed: Marcelino Duster, MD [facility] 09/27/2022

## 2022-09-27 NOTE — ED Notes (Signed)
Went over d/c paperwork at this time with patient. Pt had no questions, comments or concerns after review and verbally understood them.  

## 2022-09-27 NOTE — ED Notes (Signed)
Pt cbg was 192, will notify Lauren, RN

## 2022-09-29 LAB — C4 COMPLEMENT: Complement C4, Body Fluid: 18 mg/dL (ref 12–38)

## 2022-09-29 LAB — C3 COMPLEMENT: C3 Complement: 113 mg/dL (ref 82–167)

## 2022-10-01 LAB — C1 ESTERASE INHIBITOR: C1INH SerPl-mCnc: 27 mg/dL (ref 21–39)

## 2022-10-08 DIAGNOSIS — G4733 Obstructive sleep apnea (adult) (pediatric): Secondary | ICD-10-CM | POA: Diagnosis not present

## 2022-10-13 ENCOUNTER — Ambulatory Visit (INDEPENDENT_AMBULATORY_CARE_PROVIDER_SITE_OTHER): Payer: PPO | Admitting: Family Medicine

## 2022-10-13 ENCOUNTER — Encounter: Payer: Self-pay | Admitting: Family Medicine

## 2022-10-13 DIAGNOSIS — I10 Essential (primary) hypertension: Secondary | ICD-10-CM | POA: Diagnosis not present

## 2022-10-13 MED ORDER — EPINEPHRINE 0.3 MG/0.3ML IJ SOAJ: 0.3000 mg | INTRAMUSCULAR | 1 refills | Status: AC | PRN

## 2022-10-13 MED ORDER — AMLODIPINE BESYLATE 10 MG PO TABS
10.0000 mg | ORAL_TABLET | Freq: Every evening | ORAL | 1 refills | Status: DC
Start: 2022-10-13 — End: 2023-05-04

## 2022-10-13 NOTE — Patient Instructions (Signed)
.   Please review the attached list of medications and notify my office if there are any errors.   . Please bring all of your medications to every appointment so we can make sure that our medication list is the same as yours.   

## 2022-10-13 NOTE — Progress Notes (Signed)
Established patient visit   Patient: Dustin Guerra   DOB: 1955-10-20   67 y.o. Male  MRN: [id number] Visit Date: 10/13/2022  Today's healthcare provider: Mila Merry, MD   Chief Complaint  Patient presents with   Hospitalization Follow-up    Patient seen at [facility] on 09/26/22 and discharged on 09/27/22. Patient reports feeling well with no concerns and hopes his issue was resolved. Reports they were really clear as to what was the cause   Subjective    Discussed the use of AI scribe software for clinical note transcription with the patient, who gave verbal consent to proceed.  History of Present Illness   The patient, with a history of hypertension managed with Lisinopril and Amlodipine, presented for ER follow up 2 weeks ago with a severe allergic reaction. The episode began with intense itching, followed by hand swelling and tongue swelling. The patient also experienced dizziness and violent shaking. He was given two dose of an EpiPen and enroute to ER and  was discharged the following day.  The patient reported a similar episode of hives approximately 20 years ago, the cause of which was never identified. They denied any recent changes in diet or exposure to known allergens. The patient has a known allergy to honey bee stings, which typically results in localized swelling.  Following the allergic reaction, the patient discontinued Lisinopril, suspecting it as the cause. They continue to take Hydroxyzine for anxiety. The patient was not prescribed an EpiPen following the recent allergic reaction.       Medications: Outpatient Medications Prior to Visit  Medication Sig   Ascorbic Acid (VITAMIN C) 1000 MG tablet Take 1,000 mg by mouth daily.   Azelaic Acid 15 % FOAM Apply topically.   Cholecalciferol (VITAMIN D3) 50 MCG (2000 UT) TABS Take 1 tablet by mouth daily.   cilostazol (PLETAL) 50 MG tablet TAKE ONE TABLET BY MOUTH TWICE A DAY   diazepam (VALIUM) 10 MG tablet  TAKE 1 TABLET BY MOUTH DAILY AS NEEDED FOR ANXIETY   diclofenac Sodium (VOLTAREN) 1 % GEL Apply 2 g topically 4 (four) times daily.   diphenhydrAMINE (BENADRYL ALLERGY) 25 mg capsule Take 1 capsule (25 mg total) by mouth every 6 (six) hours as needed for itching or allergies.   hydrochlorothiazide (HYDRODIURIL) 12.5 MG tablet TAKE ONE TABLET BY MOUTH DAILY   HYDROcodone-acetaminophen (NORCO) 7.5-325 MG tablet 1/2-1 tablet up to four times a day as needed   hydrOXYzine (VISTARIL) 25 MG capsule Take 1 capsule (25 mg total) by mouth every 8 (eight) hours as needed.   lansoprazole (PREVACID) 30 MG capsule TAKE ONE CAPSULE BY MOUTH DAILY AT 12 NOON   magnesium oxide (MAG-OX) 400 MG tablet Take 1 tablet by mouth daily.   meclizine (ANTIVERT) 25 MG tablet Take 1 tablet (25 mg total) by mouth 3 (three) times daily as needed for dizziness.   meloxicam (MOBIC) 15 MG tablet Take 1 tablet (15 mg total) by mouth daily as needed.   polyethylene glycol powder (GLYCOLAX/MIRALAX) 17 GM/SCOOP powder Take 17 g by mouth as needed for mild constipation.   pravastatin (PRAVACHOL) 40 MG tablet TAKE 1 TABLET BY MOUTH DAILY   pregabalin (LYRICA) 225 MG capsule TAKE ONE CAPSULE BY MOUTH THREE TIMES A DAY   psyllium (METAMUCIL) 58.6 % powder Take 1 packet by mouth 3 (three) times daily. Large spoonful in a cup of water per day   selenium 200 MCG TABS tablet Take 200 mcg by mouth  daily.   sildenafil (VIAGRA) 100 MG tablet TAKE 1/2 TO 1 TABLET BY MOUTH DAILY AS NEEDED FOR ERECTILE DYSFUNCTION   silver sulfADIAZINE (SILVADENE) 1 % cream Apply 1 Application topically daily.   traMADol (ULTRAM) 50 MG tablet TAKE 1 TABLET BY MOUTH THREE TIMES A DAY AS NEEDED   vitamin B-12 (CYANOCOBALAMIN) 500 MCG tablet Take 500 mcg by mouth daily.   vitamin E 180 MG (400 UNITS) capsule Take 400 Units by mouth daily.   Zinc Sulfate 220 (50 Zn) MG TABS Take 1 tablet by mouth daily.   [DISCONTINUED] amLODipine (NORVASC) 5 MG tablet Take 1 tablet  (5 mg total) by mouth every evening.   [DISCONTINUED] EPINEPHrine 0.3 mg/0.3 mL IJ SOAJ injection as needed.    No facility-administered medications prior to visit.      Objective    BP (!) 151/77 (BP Location: Right Arm, Patient Position: Sitting, Cuff Size: Normal)   Pulse 91   Ht 6\' 2"  (1.88 m)   Wt 234 lb 1.6 oz (106.2 kg)   SpO2 97%   BMI 30.06 kg/m    General appearance: Mildly obese male, cooperative and in no acute distress Head: Normocephalic, without obvious abnormality, atraumatic Respiratory: Respirations even and unlabored, normal respiratory rate Extremities: All extremities are intact.  Skin: Skin color, texture, turgor normal. No rashes seen  Psych: Appropriate mood and affect. Neurologic: Mental status: Alert, oriented to person, place, and time, thought content appropriate.    Assessment & Plan     Assessment and Plan    Angioedema versus anaphylaxis: Recent episode of severe angioedema requiring hospitalization and multiple doses of epinephrine. Lisinopril suspected as the cause. -Off Lisinopril. -Prescribe EpiPen for emergency use.  Hypertension: Currently managed with Amlodipine 5mg  and Hydroxyzine. Now off of lisinopril -Increase Amlodipine to 10mg  daily. -Continue Hydroxyzine as prescribed. -Check blood pressure in 1 month.     Return in about 1 month (around 11/13/2022).      Mila Merry, MD  [facility] [phone] (phone) [phone] (fax)  [facility]

## 2022-10-14 DIAGNOSIS — G4733 Obstructive sleep apnea (adult) (pediatric): Secondary | ICD-10-CM | POA: Diagnosis not present

## 2022-10-22 ENCOUNTER — Ambulatory Visit: Payer: PPO | Admitting: Family Medicine

## 2022-11-03 ENCOUNTER — Other Ambulatory Visit: Payer: Self-pay | Admitting: Family Medicine

## 2022-11-03 DIAGNOSIS — G629 Polyneuropathy, unspecified: Secondary | ICD-10-CM

## 2022-11-03 NOTE — Telephone Encounter (Signed)
Medication Refill - Medication: HYDROcodone-acetaminophen (NORCO) 7.5-325 MG tablet   Has the patient contacted their pharmacy? Yes.   (Agent: If no, request that the patient contact the pharmacy for the refill. If patient does not wish to contact the pharmacy document the reason why and proceed with request.) (Agent: If yes, when and what did the pharmacy advise?)  Preferred Pharmacy (with phone number or street name):  [facility] [id number] [address], [place] - [address]  [address] [place] [address]  Phone: [phone] Fax: [phone]   Has the patient been seen for an appointment in the last year OR does the patient have an upcoming appointment? Yes.    Agent: Please be advised that RX refills may take up to 3 business days. We ask that you follow-up with your pharmacy.

## 2022-11-04 MED ORDER — HYDROCODONE-ACETAMINOPHEN 7.5-325 MG PO TABS
ORAL_TABLET | ORAL | 0 refills | Status: AC
Start: 2022-11-04 — End: ?

## 2022-11-04 NOTE — Telephone Encounter (Signed)
Requested medication (s) are due for refill today - yes  Requested medication (s) are on the active medication list -yes  Future visit scheduled -yes  Last refill: 09/18/22 #120  Notes to clinic: non delegated Rx  Requested Prescriptions  Pending Prescriptions Disp Refills   HYDROcodone-acetaminophen (NORCO) 7.5-325 MG tablet 120 tablet 0    Sig: 1/2-1 tablet up to four times a day as needed     Not Delegated - Analgesics:  Opioid Agonist Combinations Failed - 11/03/2022  9:37 AM      Failed - This refill cannot be delegated      Failed - Urine Drug Screen completed in last 360 days      Passed - Valid encounter within last 3 months    Recent Outpatient Visits           3 weeks ago Essential hypertension   [place] [facility] Malva Limes, MD   2 months ago OSA on CPAP   [place] [facility] Malva Limes, MD   3 months ago Primary hypertension   [place] [facility] Malva Limes, MD   4 months ago Annual physical exam   [facility] Malva Limes, MD   8 months ago Hematospermia   [place] [facility] Sherrie Mustache, Demetrios Isaacs, MD       Future Appointments             In 2 weeks Fisher, Demetrios Isaacs, MD [facility]               Requested Prescriptions  Pending Prescriptions Disp Refills   HYDROcodone-acetaminophen (NORCO) 7.5-325 MG tablet 120 tablet 0    Sig: 1/2-1 tablet up to four times a day as needed     Not Delegated - Analgesics:  Opioid Agonist Combinations Failed - 11/03/2022  9:37 AM      Failed - This refill cannot be delegated      Failed - Urine Drug Screen completed in last 360 days      Passed - Valid encounter within last 3 months    Recent Outpatient Visits           3 weeks ago Essential hypertension   [facility] Malva Limes, MD   2 months ago OSA on CPAP   [place]  [facility] Malva Limes, MD   3 months ago Primary hypertension    [facility] Malva Limes, MD   4 months ago Annual physical exam   [facility] Malva Limes, MD   8 months ago Hematospermia   [facility] Malva Limes, MD       Future Appointments             In 2 weeks Fisher, Demetrios Isaacs, MD [facility]

## 2022-11-07 ENCOUNTER — Telehealth: Payer: Self-pay | Admitting: Family Medicine

## 2022-11-07 DIAGNOSIS — I10 Essential (primary) hypertension: Secondary | ICD-10-CM

## 2022-11-07 NOTE — Telephone Encounter (Signed)
 Dustin Guerra pharmacy is requesting prescription refill hydrochlorothiazide (HYDRODIURIL) 12.5 MG tablet   Please advise

## 2022-11-08 DIAGNOSIS — G4733 Obstructive sleep apnea (adult) (pediatric): Secondary | ICD-10-CM | POA: Diagnosis not present

## 2022-11-10 MED ORDER — HYDROCHLOROTHIAZIDE 12.5 MG PO TABS
12.5000 mg | ORAL_TABLET | Freq: Every day | ORAL | 1 refills | Status: DC
Start: 2022-11-10 — End: 2023-05-23

## 2022-11-13 ENCOUNTER — Other Ambulatory Visit: Payer: Self-pay | Admitting: Family Medicine

## 2022-11-13 DIAGNOSIS — G629 Polyneuropathy, unspecified: Secondary | ICD-10-CM

## 2022-11-13 NOTE — Telephone Encounter (Signed)
[facility] faxed refill request for the following medications:    pregabalin (LYRICA) 225 MG capsule   Please advise.

## 2022-11-14 MED ORDER — PREGABALIN 225 MG PO CAPS
225.0000 mg | ORAL_CAPSULE | Freq: Three times a day (TID) | ORAL | 1 refills | Status: DC
Start: 2022-11-14 — End: 2023-04-11

## 2022-11-19 ENCOUNTER — Ambulatory Visit (INDEPENDENT_AMBULATORY_CARE_PROVIDER_SITE_OTHER): Payer: PPO | Admitting: Family Medicine

## 2022-11-19 ENCOUNTER — Ambulatory Visit: Payer: PPO | Admitting: Family Medicine

## 2022-11-19 ENCOUNTER — Encounter: Payer: Self-pay | Admitting: Family Medicine

## 2022-11-19 VITALS — BP 136/77 | HR 53 | Ht 74.0 in | Wt 236.7 lb

## 2022-11-19 DIAGNOSIS — Z6830 Body mass index (BMI) 30.0-30.9, adult: Secondary | ICD-10-CM | POA: Diagnosis not present

## 2022-11-19 DIAGNOSIS — E669 Obesity, unspecified: Secondary | ICD-10-CM

## 2022-11-19 DIAGNOSIS — I152 Hypertension secondary to endocrine disorders: Secondary | ICD-10-CM | POA: Diagnosis not present

## 2022-11-19 DIAGNOSIS — E785 Hyperlipidemia, unspecified: Secondary | ICD-10-CM | POA: Diagnosis not present

## 2022-11-19 DIAGNOSIS — E6609 Other obesity due to excess calories: Secondary | ICD-10-CM | POA: Diagnosis not present

## 2022-11-19 DIAGNOSIS — E1159 Type 2 diabetes mellitus with other circulatory complications: Secondary | ICD-10-CM

## 2022-11-19 DIAGNOSIS — I700 Atherosclerosis of aorta: Secondary | ICD-10-CM | POA: Diagnosis not present

## 2022-11-19 DIAGNOSIS — E1169 Type 2 diabetes mellitus with other specified complication: Secondary | ICD-10-CM | POA: Diagnosis not present

## 2022-11-19 DIAGNOSIS — D509 Iron deficiency anemia, unspecified: Secondary | ICD-10-CM | POA: Diagnosis not present

## 2022-11-19 DIAGNOSIS — Z125 Encounter for screening for malignant neoplasm of prostate: Secondary | ICD-10-CM | POA: Diagnosis not present

## 2022-11-19 HISTORY — DX: Encounter for screening for malignant neoplasm of prostate: Z12.5

## 2022-11-19 NOTE — Assessment & Plan Note (Signed)
Body mass index is 30.39 kg/m. Continue to recommend balanced, lower carb meals. Smaller meal size, adding snacks. Choosing water as drink of choice and increasing purposeful exercise. Discussed importance of healthy weight management Discussed diet and exercise

## 2022-11-19 NOTE — Progress Notes (Signed)
Established patient visit   Patient: Dustin Guerra   DOB: [birth year]   67 y.o. Male  MRN: [id number] Visit Date: 11/19/2022  Today's healthcare provider: Jacky Kindle, FNP  Introduced to nurse practitioner role and practice setting.  All questions answered.  Discussed provider/patient relationship and expectations.  Subjective    HPI HPI     Medical Management of Chronic Issues    Additional comments: Follow up for hypertension, no concerns today       Last edited by Rolly Salter, CMA on 11/19/2022  8:27 AM.      Hypertension, follow-up  BP Readings from Last 3 Encounters:  11/19/22 136/77  10/13/22 (!) 151/77  09/27/22 (!) 99/56   Wt Readings from Last 3 Encounters:  11/19/22 236 lb 11.2 oz (107.4 kg)  10/13/22 234 lb 1.6 oz (106.2 kg)  09/26/22 249 lb 9.6 oz (113.2 kg)     He was last seen for hypertension 1 months ago.  BP at that visit was 151/77. Management since that visit includes increase in Norvasc from 5 to 10 mg with stopping Lisinopril in setting of 7/12 ER visit for angioedema.  He reports excellent compliance with treatment. He is not having side effects.  He is following a Regular diet. He is not exercising. He does not smoke.  Use of agents associated with hypertension: none.   Outside blood pressures are 119-130/70-90s. Symptoms: No chest pain No chest pressure  No palpitations No syncope  No dyspnea No orthopnea  No paroxysmal nocturnal dyspnea No lower extremity edema   Pertinent labs Lab Results  Component Value Date   CHOL 111 06/23/2022   HDL 41 06/23/2022   LDLCALC 55 06/23/2022   TRIG 75 06/23/2022   CHOLHDL 2.7 06/23/2022   Lab Results  Component Value Date   NA 141 09/27/2022   K 4.1 09/27/2022   CREATININE 1.04 09/27/2022   GFRNONAA >60 09/27/2022   GLUCOSE 196 (H) 09/27/2022   TSH 4.170 09/05/2019     The ASCVD Risk score (Arnett DK, et al., 2019) failed to calculate for the following reasons:   The  valid total cholesterol range is 130 to 320 mg/dL  ---------------------------------------------------------------------------------------------------   Medications: Outpatient Medications Prior to Visit  Medication Sig   amLODipine (NORVASC) 10 MG tablet Take 1 tablet (10 mg total) by mouth every evening.   Ascorbic Acid (VITAMIN C) 1000 MG tablet Take 1,000 mg by mouth daily.   Azelaic Acid 15 % FOAM Apply topically.   Cholecalciferol (VITAMIN D3) 50 MCG (2000 UT) TABS Take 1 tablet by mouth daily.   cilostazol (PLETAL) 50 MG tablet TAKE ONE TABLET BY MOUTH TWICE A DAY   diazepam (VALIUM) 10 MG tablet TAKE 1 TABLET BY MOUTH DAILY AS NEEDED FOR ANXIETY   diclofenac Sodium (VOLTAREN) 1 % GEL Apply 2 g topically 4 (four) times daily.   diphenhydrAMINE (BENADRYL ALLERGY) 25 mg capsule Take 1 capsule (25 mg total) by mouth every 6 (six) hours as needed for itching or allergies.   EPINEPHrine 0.3 mg/0.3 mL IJ SOAJ injection Inject 0.3 mg into the skin as needed.   hydrochlorothiazide (HYDRODIURIL) 12.5 MG tablet Take 1 tablet (12.5 mg total) by mouth daily.   HYDROcodone-acetaminophen (NORCO) 7.5-325 MG tablet 1/2-1 tablet up to four times a day as needed   hydrOXYzine (VISTARIL) 25 MG capsule Take 1 capsule (25 mg total) by mouth every 8 (eight) hours as needed.   lansoprazole (PREVACID) 30 MG capsule TAKE ONE  CAPSULE BY MOUTH DAILY AT 12 NOON   magnesium oxide (MAG-OX) 400 MG tablet Take 1 tablet by mouth daily.   meclizine (ANTIVERT) 25 MG tablet Take 1 tablet (25 mg total) by mouth 3 (three) times daily as needed for dizziness.   meloxicam (MOBIC) 15 MG tablet Take 1 tablet (15 mg total) by mouth daily as needed.   polyethylene glycol powder (GLYCOLAX/MIRALAX) 17 GM/SCOOP powder Take 17 g by mouth as needed for mild constipation.   pravastatin (PRAVACHOL) 40 MG tablet TAKE 1 TABLET BY MOUTH DAILY   pregabalin (LYRICA) 225 MG capsule Take 1 capsule (225 mg total) by mouth 3 (three) times  daily.   psyllium (METAMUCIL) 58.6 % powder Take 1 packet by mouth 3 (three) times daily. Large spoonful in a cup of water per day   selenium 200 MCG TABS tablet Take 200 mcg by mouth daily.   sildenafil (VIAGRA) 100 MG tablet TAKE 1/2 TO 1 TABLET BY MOUTH DAILY AS NEEDED FOR ERECTILE DYSFUNCTION   silver sulfADIAZINE (SILVADENE) 1 % cream Apply 1 Application topically daily.   traMADol (ULTRAM) 50 MG tablet TAKE 1 TABLET BY MOUTH THREE TIMES A DAY AS NEEDED   vitamin B-12 (CYANOCOBALAMIN) 500 MCG tablet Take 500 mcg by mouth daily.   vitamin E 180 MG (400 UNITS) capsule Take 400 Units by mouth daily.   Zinc Sulfate 220 (50 Zn) MG TABS Take 1 tablet by mouth daily.   No facility-administered medications prior to visit.     Objective    BP 136/77 (BP Location: Left Arm, Patient Position: Sitting, Cuff Size: Large)   Pulse (!) 53   Ht 6\' 2"  (1.88 m)   Wt 236 lb 11.2 oz (107.4 kg)   SpO2 100%   BMI 30.39 kg/m   Physical Exam Vitals and nursing note reviewed.  Constitutional:      Appearance: Normal appearance. He is obese.  HENT:     Head: Normocephalic and atraumatic.  Cardiovascular:     Rate and Rhythm: Normal rate and regular rhythm.     Pulses: Normal pulses.     Heart sounds: Normal heart sounds.  Pulmonary:     Effort: Pulmonary effort is normal.     Breath sounds: Normal breath sounds.  Musculoskeletal:        General: Normal range of motion.     Cervical back: Normal range of motion.  Skin:    General: Skin is warm and dry.     Capillary Refill: Capillary refill takes less than 2 seconds.  Neurological:     General: No focal deficit present.     Mental Status: He is alert and oriented to person, place, and time. Mental status is at baseline.  Psychiatric:        Mood and Affect: Mood normal.        Behavior: Behavior normal.        Thought Content: Thought content normal.        Judgment: Judgment normal.     No results found for any visits on 11/19/22.   Assessment & Plan     Problem List Items Addressed This Visit       Cardiovascular and Mediastinum   Aortic atherosclerosis (HCC)    Chronic, recommend LDL goal of 55-70 to assist recommend diet low in saturated fat and regular exercise - 30 min at least 5 times per week Repeat LP  Encourage PCP to titrate pravastatin to atorvastatin or rosuvastatin to assist  Hypertension associated with diabetes (HCC) - Primary    Chronic, improved; however, not consistently at goal Defer med changes at this time Pt notes plan to lose 20# for class reunion  Goal of 119/79      Relevant Orders   CBC with Differential/Platelet   Comprehensive Metabolic Panel (CMET)   TSH     Endocrine   Hyperlipidemia associated with type 2 diabetes mellitus (HCC)    Chronic, consistent with statin 40 mg pravastatin  Repeat LP Unable to calculate ASCVD given no recent LP      Relevant Orders   Lipid panel   Hemoglobin A1c   Urine Microalbumin w/creat. ratio     Other   Class 1 obesity due to excess calories with serious comorbidity and body mass index (BMI) of 30.0 to 30.9 in adult   Iron deficiency anemia    Chronic, consider colonoscopy to assist vs hematology workup Pt without complaints Repeat CBC with diff/platelets      Relevant Orders   CBC with Differential/Platelet   Obesity (BMI 30-39.9)    Body mass index is 30.39 kg/m. Continue to recommend balanced, lower carb meals. Smaller meal size, adding snacks. Choosing water as drink of choice and increasing purposeful exercise. Discussed importance of healthy weight management Discussed diet and exercise       Prostate cancer screening    Denies LUTS; recommend PSA in place of DRE. If PSA is elevated for age, we will repeat; if PSA remains elevated pt will be referred to urology for DRE and next steps for best treatment.       Relevant Orders   PSA   Return in about 11 weeks (around 02/04/2023) for labs.     Leilani Merl, FNP, have reviewed all documentation for this visit. The documentation on 11/19/22 for the exam, diagnosis, procedures, and orders are all accurate and complete.  Jacky Kindle, FNP  [facility] [phone] (phone) [phone] (fax)  [facility]

## 2022-11-19 NOTE — Patient Instructions (Signed)
Please schedule DM eye exam  Recommend labs to assist with chronic disease prevention

## 2022-11-19 NOTE — Assessment & Plan Note (Signed)
Denies LUTS; recommend PSA in place of DRE. If PSA is elevated for age, we will repeat; if PSA remains elevated pt will be referred to urology for DRE and next steps for best treatment.  

## 2022-11-19 NOTE — Assessment & Plan Note (Signed)
Chronic, improved; however, not consistently at goal Defer med changes at this time Pt notes plan to lose 20# for class reunion  Goal of 119/79

## 2022-11-19 NOTE — Assessment & Plan Note (Signed)
Chronic, recommend LDL goal of 55-70 to assist recommend diet low in saturated fat and regular exercise - 30 min at least 5 times per week Repeat LP  Encourage PCP to titrate pravastatin to atorvastatin or rosuvastatin to assist

## 2022-11-19 NOTE — Assessment & Plan Note (Signed)
Chronic, consider colonoscopy to assist vs hematology workup Pt without complaints Repeat CBC with diff/platelets

## 2022-11-19 NOTE — Assessment & Plan Note (Signed)
Chronic, consistent with statin 40 mg pravastatin  Repeat LP Unable to calculate ASCVD given no recent LP

## 2022-12-15 ENCOUNTER — Other Ambulatory Visit: Payer: Self-pay | Admitting: Family Medicine

## 2022-12-15 DIAGNOSIS — G629 Polyneuropathy, unspecified: Secondary | ICD-10-CM

## 2022-12-15 NOTE — Telephone Encounter (Unsigned)
Copied from CRM [id number]. Topic: General - Other >> Dec 15, 2022  1:56 PM Dustin Guerra wrote: Reason for CRM: Medication Refill - Medication: HYDROcodone-acetaminophen (NORCO) 7.5-325 MG tablet [[id number]]  Has the patient contacted their pharmacy? Yes.   (Agent: If no, request that the patient contact the pharmacy for the refill. If patient does not wish to contact the pharmacy document the reason why and proceed with request.) (Agent: If yes, when and what did the pharmacy advise?)  Preferred Pharmacy (with phone number or street name): [id number] [address], [place] - [address] [place] [address] Phone: [phone] Fax: [phone] Hours: Not open 24 hours   Has the patient been seen for an appointment in the last year OR does the patient have an upcoming appointment? Yes.    Agent: Please be advised that RX refills may take up to 3 business days. We ask that you follow-up with your pharmacy.

## 2022-12-16 NOTE — Telephone Encounter (Signed)
Requested medication (s) are due for refill today: yes  Requested medication (s) are on the active medication list: yes  Last refill:  11/04/22  Future visit scheduled: yes  Notes to clinic:  Unable to refill per protocol, cannot delegate.      Requested Prescriptions  Pending Prescriptions Disp Refills   HYDROcodone-acetaminophen (NORCO) 7.5-325 MG tablet 120 tablet 0    Sig: 1/2-1 tablet up to four times a day as needed     Not Delegated - Analgesics:  Opioid Agonist Combinations Failed - 12/15/2022  2:52 PM      Failed - This refill cannot be delegated      Failed - Urine Drug Screen completed in last 360 days      Passed - Valid encounter within last 3 months    Recent Outpatient Visits           3 weeks ago Hypertension associated with diabetes [facility])   [place] [facility] Jacky Kindle, FNP   2 months ago Essential hypertension   [place] [facility] Malva Limes, MD   4 months ago OSA on CPAP   [facility] Malva Limes, MD   4 months ago Primary hypertension    [facility] Malva Limes, MD   5 months ago Annual physical exam   [facility] Malva Limes, MD       Future Appointments             In 6 months Fisher, Demetrios Isaacs, MD [facility]

## 2022-12-17 MED ORDER — HYDROCODONE-ACETAMINOPHEN 7.5-325 MG PO TABS
ORAL_TABLET | ORAL | 0 refills | Status: DC
Start: 2022-12-17 — End: 2023-02-02

## 2022-12-20 ENCOUNTER — Other Ambulatory Visit: Payer: Self-pay | Admitting: Family Medicine

## 2022-12-20 DIAGNOSIS — I700 Atherosclerosis of aorta: Secondary | ICD-10-CM

## 2022-12-22 NOTE — Telephone Encounter (Signed)
Requested Prescriptions  Pending Prescriptions Disp Refills   pravastatin (PRAVACHOL) 40 MG tablet [Pharmacy Med Name: PRAVASTATIN SODIUM 40 MG TAB] 90 tablet 1    Sig: TAKE 1 TABLET BY MOUTH DAILY     Cardiovascular:  Antilipid - Statins Failed - 12/20/2022  6:52 AM      Failed - Lipid Panel in normal range within the last 12 months    Cholesterol, Total  Date Value Ref Range Status  06/23/2022 111 100 - 199 mg/dL Final   Cholesterol  Date Value Ref Range Status  06/04/2012 85 0 - 200 mg/dL Final   Ldl Cholesterol, Calc  Date Value Ref Range Status  06/04/2012 39 0 - 100 mg/dL Final   LDL Chol Calc (NIH)  Date Value Ref Range Status  06/23/2022 55 0 - 99 mg/dL Final   HDL Cholesterol  Date Value Ref Range Status  06/04/2012 24 (L) 40 - 60 mg/dL Final   HDL  Date Value Ref Range Status  06/23/2022 41 >39 mg/dL Final   Triglycerides  Date Value Ref Range Status  06/23/2022 75 0 - 149 mg/dL Final  09/81/1914 782 0 - 200 mg/dL Final         Passed - Patient is not pregnant      Passed - Valid encounter within last 12 months    Recent Outpatient Visits           1 month ago Hypertension associated with diabetes [facility])   [place] [facility] Jacky Kindle, FNP   2 months ago Essential hypertension   [place] [facility] Malva Limes, MD   4 months ago OSA on CPAP   [facility] Malva Limes, MD   5 months ago Primary hypertension   [place] [facility] Malva Limes, MD   6 months ago Annual physical exam   [facility] Malva Limes, MD       Future Appointments             In 6 months Fisher, Demetrios Isaacs, MD [facility], [facility]

## 2022-12-24 ENCOUNTER — Other Ambulatory Visit: Payer: Self-pay | Admitting: Family Medicine

## 2022-12-25 NOTE — Telephone Encounter (Signed)
Requested medication (s) are due for refill today: yes  Requested medication (s) are on the active medication list: yes  Last refill:  08/21/22  Future visit scheduled: yes  Notes to clinic:  Unable to refill per protocol, cannot delegate.      Requested Prescriptions  Pending Prescriptions Disp Refills   diazepam (VALIUM) 10 MG tablet [Pharmacy Med Name: diazePAM 10 MG TABLET] 15 tablet     Sig: TAKE 1 TABLET BY MOUTH DAILY AS NEEDED FOR ANXIETY     Not Delegated - Psychiatry: Anxiolytics/Hypnotics 2 Failed - 12/24/2022  4:55 PM      Failed - This refill cannot be delegated      Failed - Urine Drug Screen completed in last 360 days      Passed - Patient is not pregnant      Passed - Valid encounter within last 6 months    Recent Outpatient Visits           1 month ago Hypertension associated with diabetes [facility])   [place] [facility] Jacky Kindle, FNP   2 months ago Essential hypertension   [place] [facility] Malva Limes, MD   4 months ago OSA on CPAP   [facility] Malva Limes, MD   5 months ago Primary hypertension   [place] [facility] Malva Limes, MD   6 months ago Annual physical exam   [facility] Malva Limes, MD       Future Appointments             In 6 months Fisher, Demetrios Isaacs, MD [facility], [facility]

## 2023-01-20 DIAGNOSIS — G4733 Obstructive sleep apnea (adult) (pediatric): Secondary | ICD-10-CM | POA: Diagnosis not present

## 2023-02-02 ENCOUNTER — Other Ambulatory Visit: Payer: Self-pay | Admitting: Family Medicine

## 2023-02-02 DIAGNOSIS — G629 Polyneuropathy, unspecified: Secondary | ICD-10-CM

## 2023-02-02 NOTE — Telephone Encounter (Unsigned)
Copied from CRM [id number]. Topic: General - Other >> Feb 02, 2023 10:07 AM Everette C wrote: Reason for CRM: Medication Refill - Most Recent Primary Care Visit:  Provider: Merita Norton T Department: [facility] Visit Type: OFFICE VISIT Date: 11/19/2022  Medication: HYDROcodone-acetaminophen (NORCO) 7.5-325 MG tablet [[id number]]  Has the patient contacted their pharmacy? No (Agent: If no, request that the patient contact the pharmacy for the refill. If patient does not wish to contact the pharmacy document the reason why and proceed with request.) (Agent: If yes, when and what did the pharmacy advise?)  Is this the correct pharmacy for this prescription? Yes If no, delete pharmacy and type the correct one.  This is the patient's preferred pharmacy:  [facility] [id number] [place], [place] - [address] [place] [address] Phone: [phone] Fax: [phone]   Has the prescription been filled recently? Yes  Is the patient out of the medication? Yes  Has the patient been seen for an appointment in the last year OR does the patient have an upcoming appointment? Yes  Can we respond through MyChart? No  Agent: Please be advised that Rx refills may take up to 3 business days. We ask that you follow-up with your pharmacy.

## 2023-02-03 MED ORDER — HYDROCODONE-ACETAMINOPHEN 7.5-325 MG PO TABS
ORAL_TABLET | ORAL | 0 refills | Status: DC
Start: 2023-02-03 — End: 2023-03-14

## 2023-02-03 NOTE — Telephone Encounter (Signed)
Requested medication (s) are due for refill today: Yes  Requested medication (s) are on the active medication list: Yes  Last refill:  12/17/22  Future visit scheduled: Yes  Notes to clinic:  Not delegated.    Requested Prescriptions  Pending Prescriptions Disp Refills   HYDROcodone-acetaminophen (NORCO) 7.5-325 MG tablet 120 tablet 0    Sig: 1/2-1 tablet up to four times a day as needed     Not Delegated - Analgesics:  Opioid Agonist Combinations Failed - 02/02/2023 10:17 AM      Failed - This refill cannot be delegated      Failed - Urine Drug Screen completed in last 360 days      Passed - Valid encounter within last 3 months    Recent Outpatient Visits           2 months ago Hypertension associated with diabetes [facility])   [place] [facility] Jacky Kindle, FNP   3 months ago Essential hypertension   [place] [facility] Malva Limes, MD   5 months ago OSA on CPAP   [facility] Malva Limes, MD   6 months ago Primary hypertension   [place] Malva Limes, MD   7 months ago Annual physical exam   [facility] Malva Limes, MD       Future Appointments             In 4 months Fisher, Demetrios Isaacs, MD [facility], [facility]

## 2023-03-06 ENCOUNTER — Other Ambulatory Visit: Payer: Self-pay | Admitting: Family Medicine

## 2023-03-06 DIAGNOSIS — G629 Polyneuropathy, unspecified: Secondary | ICD-10-CM

## 2023-03-14 ENCOUNTER — Other Ambulatory Visit: Payer: Self-pay | Admitting: Family Medicine

## 2023-03-14 DIAGNOSIS — G629 Polyneuropathy, unspecified: Secondary | ICD-10-CM

## 2023-03-15 MED ORDER — HYDROCODONE-ACETAMINOPHEN 7.5-325 MG PO TABS
ORAL_TABLET | ORAL | 0 refills | Status: DC
Start: 2023-03-15 — End: 2023-04-10

## 2023-03-19 DIAGNOSIS — Z13228 Encounter for screening for other metabolic disorders: Secondary | ICD-10-CM | POA: Diagnosis not present

## 2023-03-19 LAB — MICROALBUMIN / CREATININE URINE RATIO
Albumin, Urine POC: 6.6
Creatinine, POC: 69.9 mg/dL
Microalb Creat Ratio: 9

## 2023-03-24 ENCOUNTER — Encounter: Payer: Self-pay | Admitting: Family Medicine

## 2023-04-10 ENCOUNTER — Other Ambulatory Visit: Payer: Self-pay | Admitting: Family Medicine

## 2023-04-10 DIAGNOSIS — G629 Polyneuropathy, unspecified: Secondary | ICD-10-CM

## 2023-04-10 NOTE — Telephone Encounter (Signed)
Requested medication (s) are due for refill today: yes  Requested medication (s) are on the active medication list: yes  Last refill:  11/14/22  Future visit scheduled: yes  Notes to clinic:  Unable to refill per protocol, cannot delegate.      Requested Prescriptions  Pending Prescriptions Disp Refills   pregabalin (LYRICA) 225 MG capsule [Pharmacy Med Name: PREGABALIN 225 MG CAPSULE] 270 capsule     Sig: TAKE 1 CAPSULE BY MOUTH 3 TIMES A DAY     Not Delegated - Neurology:  Anticonvulsants - Controlled - pregabalin Failed - 04/10/2023  4:06 PM      Failed - This refill cannot be delegated      Passed - Cr in normal range and within 360 days    Creat  Date Value Ref Range Status  11/20/2016 0.90 0.70 - 1.33 mg/dL Final    Comment:    For patients >51 years of age, the reference limit for Creatinine is approximately 13% higher for people identified as African-American. .    Creatinine, Ser  Date Value Ref Range Status  09/27/2022 1.04 0.61 - 1.24 mg/dL Final   Creatinine, POC  Date Value Ref Range Status  03/19/2023 69.9 mg/dL Final    Comment:    ABSTRACTED BY HIM         Passed - Completed PHQ-2 or PHQ-9 in the last 360 days      Passed - Valid encounter within last 12 months    Recent Outpatient Visits           4 months ago Hypertension associated with diabetes [facility])   [facility] Jacky Kindle, FNP   5 months ago Essential hypertension   [facility] Malva Limes, MD   8 months ago OSA on CPAP   [facility] Malva L[facility] MD   8 months ago Primary hypertension   [place] [facility] Malva Limes, MD   9 months ago Annual physical exam   [facility] Malva Limes, MD       Future Appointments             In 2 months Fisher, Demetrios Isaacs, MD [facility], [facility]

## 2023-04-11 MED ORDER — HYDROCODONE-ACETAMINOPHEN 7.5-325 MG PO TABS
ORAL_TABLET | ORAL | 0 refills | Status: DC
Start: 2023-04-11 — End: 2023-05-13

## 2023-04-26 ENCOUNTER — Other Ambulatory Visit: Payer: Self-pay | Admitting: Family Medicine

## 2023-04-26 DIAGNOSIS — G629 Polyneuropathy, unspecified: Secondary | ICD-10-CM

## 2023-05-02 ENCOUNTER — Other Ambulatory Visit: Payer: Self-pay | Admitting: Family Medicine

## 2023-05-02 DIAGNOSIS — I10 Essential (primary) hypertension: Secondary | ICD-10-CM

## 2023-05-04 NOTE — Telephone Encounter (Signed)
 Requested Prescriptions  Pending Prescriptions Disp Refills   amLODipine (NORVASC) 10 MG tablet [Pharmacy Med Name: amLODIPine BESYLATE 10MG  TAB] 90 tablet 0    Sig: TAKE ONE TABLET BY MOUTH EVERY EVENING     Cardiovascular: Calcium Channel Blockers 2 Passed - 05/04/2023 11:49 AM      Passed - Last BP in normal range    BP Readings from Last 1 Encounters:  11/19/22 136/77         Passed - Last Heart Rate in normal range    Pulse Readings from Last 1 Encounters:  11/19/22 (!) 53         Passed - Valid encounter within last 6 months    Recent Outpatient Visits           5 months ago Hypertension associated with diabetes [facility])   [facility] Jacky Kindle, FNP   6 months ago Essential hypertension   [place] [facility] Malva Limes, MD   8 months ago OSA on CPAP   [facility] Malva Limes, MD   9 months ago Primary hypertension   [place] [facility] Malva Limes, MD   10 months ago Annual physical exam   [facility] Malva Limes, MD       Future Appointments             In 1 month Fisher, Demetrios Isaacs, MD [facility], [facility]

## 2023-05-05 ENCOUNTER — Other Ambulatory Visit: Payer: Self-pay | Admitting: Family Medicine

## 2023-05-05 NOTE — Telephone Encounter (Signed)
 Requested Prescriptions  Pending Prescriptions Disp Refills   meloxicam (MOBIC) 15 MG tablet [Pharmacy Med Name: MELOXICAM 15 MG TABLET] 90 tablet 0    Sig: TAKE 1 TABLET BY MOUTH DAILY AS NEEDED     Analgesics:  COX2 Inhibitors Failed - 05/05/2023  5:21 PM      Failed - Manual Review: Labs are only required if the patient has taken medication for more than 8 weeks.      Failed - HGB in normal range and within 360 days    Hemoglobin  Date Value Ref Range Status  09/27/2022 11.4 (L) 13.0 - 17.0 g/dL Final  78/46/9629 52.8 (L) 13.0 - 17.7 g/dL Final         Failed - HCT in normal range and within 360 days    HCT  Date Value Ref Range Status  09/27/2022 33.1 (L) 39.0 - 52.0 % Final   Hematocrit  Date Value Ref Range Status  06/23/2022 37.5 37.5 - 51.0 % Final         Passed - Cr in normal range and within 360 days    Creat  Date Value Ref Range Status  11/20/2016 0.90 0.70 - 1.33 mg/dL Final    Comment:    For patients >60 years of age, the reference limit for Creatinine is approximately 13% higher for people identified as African-American. .    Creatinine, Ser  Date Value Ref Range Status  09/27/2022 1.04 0.61 - 1.24 mg/dL Final   Creatinine, POC  Date Value Ref Range Status  03/19/2023 69.9 mg/dL Final    Comment:    ABSTRACTED BY HIM         Passed - AST in normal range and within 360 days    AST  Date Value Ref Range Status  06/23/2022 22 0 - 40 IU/L Final   SGOT(AST)  Date Value Ref Range Status  06/03/2012 48 (H) 15 - 37 Unit/L Final         Passed - ALT in normal range and within 360 days    ALT  Date Value Ref Range Status  06/23/2022 20 0 - 44 IU/L Final   SGPT (ALT)  Date Value Ref Range Status  06/03/2012 34 12 - 78 U/L Final         Passed - eGFR is 30 or above and within 360 days    EGFR (African American)  Date Value Ref Range Status  06/04/2012 >60  Final   GFR calc Af Amer  Date Value Ref Range Status  09/05/2019 92 >59  mL/min/1.73 Final    Comment:    **[facility] currently reports eGFR in compliance with the current**   recommendations of the [employer]. [facility] will   update reporting as new guidelines are published from the NKF-ASN   Task force.    EGFR (Non-African Amer.)  Date Value Ref Range Status  06/04/2012 >60  Final    Comment:    eGFR values <34mL/min/1.73 m2 may be an indication of chronic kidney disease (CKD). Calculated eGFR is useful in patients with stable renal function. The eGFR calculation will not be reliable in acutely ill patients when serum creatinine is changing rapidly. It is not useful in  patients on dialysis. The eGFR calculation may not be applicable to patients at the low and high extremes of body sizes, pregnant women, and vegetarians.    GFR, Estimated  Date Value Ref Range Status  09/27/2022 >60 >60 mL/min Final    Comment:    (  NOTE) Calculated using the CKD-EPI Creatinine Equation (2021)    eGFR  Date Value Ref Range Status  06/23/2022 82 >59 mL/min/1.73 Final         Passed - Patient is not pregnant      Passed - Valid encounter within last 12 months    Recent Outpatient Visits           5 months ago Hypertension associated with diabetes [facility])   [place] [facility] Jacky Kindle, FNP   6 months ago Essential hypertension   [place] [facility] Malva Limes, MD   8 months ago OSA on CPAP   [facility] Malva Limes, MD   9 months ago Primary hypertension   [place] [facility] Malva Limes, MD   10 months ago Annual physical exam   [facility] Malva Limes, MD       Future Appointments             In 1 month Fisher, Demetrios Isaacs, MD [facility], [facility]

## 2023-05-13 ENCOUNTER — Other Ambulatory Visit: Payer: Self-pay | Admitting: Family Medicine

## 2023-05-13 DIAGNOSIS — G629 Polyneuropathy, unspecified: Secondary | ICD-10-CM

## 2023-05-14 MED ORDER — HYDROCODONE-ACETAMINOPHEN 7.5-325 MG PO TABS
ORAL_TABLET | ORAL | 0 refills | Status: DC
Start: 2023-05-14 — End: 2023-06-24

## 2023-05-19 ENCOUNTER — Telehealth: Payer: Self-pay

## 2023-05-19 NOTE — Telephone Encounter (Unsigned)
 Copied from CRM [id number]. Topic: Clinical - Medical Advice >> May 18, 2023  8:46 AM Carlatta H wrote: Reason for CRM: Patient would like a call from a nurse//

## 2023-05-20 ENCOUNTER — Ambulatory Visit: Payer: Self-pay

## 2023-05-20 VITALS — Ht 74.0 in | Wt 230.0 lb

## 2023-05-20 DIAGNOSIS — Z0000 Encounter for general adult medical examination without abnormal findings: Secondary | ICD-10-CM | POA: Diagnosis not present

## 2023-05-20 NOTE — Progress Notes (Signed)
 Subjective:   Dustin Guerra is a 68 y.o. who presents for a Medicare Wellness preventive visit.  Visit Complete: Virtual I connected with  Dustin Guerra on 05/20/23 by a audio enabled telemedicine application and verified that I am speaking with the correct person using two identifiers.  Patient Location: Home  Provider Location: Home Office  I discussed the limitations of evaluation and management by telemedicine. The patient expressed understanding and agreed to proceed.  Vital Signs: Because this visit was a virtual/telehealth visit, some criteria may be missing or patient reported. Any vitals not documented were not able to be obtained and vitals that have been documented are patient reported.  VideoDeclined- This patient declined Librarian, academic. Therefore the visit was completed with audio only.  AWV Questionnaire: Yes: Patient Medicare AWV questionnaire was completed by the patient on 05/16/23; I have confirmed that all information answered by patient is correct and no changes since this date.  Cardiac Risk Factors include: advanced age (>42men, >30 women);male gender;hypertension;diabetes mellitus;dyslipidemia;Other (see comment), Risk factor comments: OSA (cpap)     Objective:    Today's Vitals   05/20/23 0857  Weight: 230 lb (104.3 kg)  Height: 6\' 2"  (1.88 m)   Body mass index is 29.53 kg/m.     05/20/2023    9:11 AM 09/26/2022    8:53 AM 06/10/2022    2:50 PM 03/21/2022    9:52 AM 03/20/2022    9:41 AM 11/12/2020   10:01 AM 09/22/2016    3:58 PM  Advanced Directives  Does Patient Have a Medical Advance Directive? No No No No No No No  Would patient like information on creating a medical advance directive? Yes (MAU/Ambulatory/Procedural Areas - Information given)    No - Patient declined      Current Medications (verified) Outpatient Encounter Medications as of 05/20/2023  Medication Sig   amLODipine (NORVASC) 10 MG tablet  TAKE ONE TABLET BY MOUTH EVERY EVENING   Ascorbic Acid (VITAMIN C) 1000 MG tablet Take 1,000 mg by mouth daily.   Azelaic Acid 15 % FOAM Apply topically.   Cholecalciferol (VITAMIN D3) 50 MCG (2000 UT) TABS Take 1 tablet by mouth daily.   cilostazol (PLETAL) 50 MG tablet TAKE ONE TABLET BY MOUTH TWICE A DAY   diazepam (VALIUM) 10 MG tablet TAKE 1 TABLET BY MOUTH DAILY AS NEEDED FOR ANXIETY   diclofenac Sodium (VOLTAREN) 1 % GEL Apply 2 g topically 4 (four) times daily.   diphenhydrAMINE (BENADRYL ALLERGY) 25 mg capsule Take 1 capsule (25 mg total) by mouth every 6 (six) hours as needed for itching or allergies.   EPINEPHrine 0.3 mg/0.3 mL IJ SOAJ injection Inject 0.3 mg into the skin as needed.   hydrochlorothiazide (HYDRODIURIL) 12.5 MG tablet Take 1 tablet (12.5 mg total) by mouth daily.   HYDROcodone-acetaminophen (NORCO) 7.5-325 MG tablet 1/2-1 tablet up to four times a day as needed   lansoprazole (PREVACID) 30 MG capsule TAKE ONE CAPSULE BY MOUTH DAILY AT 12 NOON   magnesium oxide (MAG-OX) 400 MG tablet Take 1 tablet by mouth daily.   meclizine (ANTIVERT) 25 MG tablet Take 1 tablet (25 mg total) by mouth 3 (three) times daily as needed for dizziness.   meloxicam (MOBIC) 15 MG tablet TAKE 1 TABLET BY MOUTH DAILY AS NEEDED   polyethylene glycol powder (GLYCOLAX/MIRALAX) 17 GM/SCOOP powder Take 17 g by mouth as needed for mild constipation.   pravastatin (PRAVACHOL) 40 MG tablet TAKE 1 TABLET BY  MOUTH DAILY   pregabalin (LYRICA) 225 MG capsule TAKE 1 CAPSULE BY MOUTH 3 TIMES A DAY   psyllium (METAMUCIL) 58.6 % powder Take 1 packet by mouth 3 (three) times daily. Large spoonful in a cup of water per day   selenium 200 MCG TABS tablet Take 200 mcg by mouth daily.   sildenafil (VIAGRA) 100 MG tablet TAKE 1/2 TO 1 TABLET BY MOUTH DAILY AS NEEDED FOR ERECTILE DYSFUNCTION   silver sulfADIAZINE (SILVADENE) 1 % cream Apply 1 Application topically daily.   traMADol (ULTRAM) 50 MG tablet TAKE 1 TABLET  BY MOUTH THREE TIMES A DAY AS NEEDED   vitamin B-12 (CYANOCOBALAMIN) 500 MCG tablet Take 500 mcg by mouth daily.   vitamin E 180 MG (400 UNITS) capsule Take 400 Units by mouth daily.   Zinc Sulfate 220 (50 Zn) MG TABS Take 1 tablet by mouth daily.   hydrOXYzine (VISTARIL) 25 MG capsule Take 1 capsule (25 mg total) by mouth every 8 (eight) hours as needed. (Patient not taking: Reported on 05/20/2023)   No facility-administered encounter medications on file as of 05/20/2023.    Allergies (verified) Bee venom, Ibuprofen, and Lisinopril   History: Past Medical History:  Diagnosis Date   Anxiety    Arthritis    Charcot's joint    left ankle   Diabetes mellitus without complication (HCC)    GERD (gastroesophageal reflux disease)    Hypertension    Neuropathy    bilateral feet per pt   Plantar fasciitis 08/04/2015   PONV (postoperative nausea and vomiting)    Prostate cancer screening 11/19/2022   Sleep apnea    Past Surgical History:  Procedure Laterality Date   BACK SURGERY     COLON SURGERY  2015   Colon resection due to diverticulosis   COLONOSCOPY WITH PROPOFOL N/A 03/20/2022   Procedure: COLONOSCOPY WITH PROPOFOL;  Surgeon: Wyline Mood, MD;  Location: [facility] ENDOSCOPY;  Service: Gastroenterology;  Laterality: N/A;   COLONOSCOPY WITH PROPOFOL N/A 03/21/2022   Procedure: COLONOSCOPY WITH PROPOFOL;  Surgeon: Wyline Mood, MD;  Location: [facility] ENDOSCOPY;  Service: Gastroenterology;  Laterality: N/A;   ESOPHAGOGASTRODUODENOSCOPY (EGD) WITH PROPOFOL N/A 03/20/2022   Procedure: ESOPHAGOGASTRODUODENOSCOPY (EGD) WITH PROPOFOL;  Surgeon: Wyline Mood, MD;  Location: [facility] ENDOSCOPY;  Service: Gastroenterology;  Laterality: N/A;   ESOPHAGOGASTRODUODENOSCOPY (EGD) WITH PROPOFOL N/A 03/21/2022   Procedure: ESOPHAGOGASTRODUODENOSCOPY (EGD) WITH PROPOFOL;  Surgeon: Wyline Mood, MD;  Location[facility] ENDOSCOPY;  Service: Gastroenterology;  Laterality: N/A;   HERNIA REPAIR     SPINE SURGERY     Rupture  disc   VEIN LIGATION AND STRIPPING     VEIN SURGERY     Vein stripping at age 19-25   VENTRAL HERNIA REPAIR N/A 11/22/2020   Procedure: OPEN VENTRAL INCISIONAL HERNIA REPAIR WITH MESH PATCH;  Surgeon: Darnell Level, MD;  Location: [facility] ORS;  Service: General;  Laterality: N/A;   Family History  Problem Relation Age of Onset   Alcohol abuse Mother    Esophageal cancer Father    Cancer Father    Heart failure Brother    Social History   Socioeconomic History   Marital status: Divorced    Spouse name: Not on file   Number of children: 3   Years of education: Not on file   Highest education level: Associate degree: occupational, Scientist, product/process development, or vocational program  Occupational History   Not on file  Tobacco Use   Smoking status: Former    Current packs/day: 0.00    Average  packs/day: 1.5 packs/day for 10.0 years (15.0 ttl pk-yrs)    Types: Cigarettes    Start date: 08/14/1988    Quit date: 08/15/1998    Years since quitting: 24.7    Passive exposure: Never   Smokeless tobacco: Former    Types: Chew    Quit date: 2015   Tobacco comments:    quit 2000, smoked about 1.5 packs for about 5 years  Vaping Use   Vaping status: Never Used  Substance and Sexual Activity   Alcohol use: Yes    Alcohol/week: 3.0 - 6.0 standard drinks of alcohol    Types: 3 - 6 Glasses of wine per week    Comment: 1-2 glasses of Red wine before bed x 3 days a week   Drug use: No   Sexual activity: Not Currently    Birth control/protection: Condom  Other Topics Concern   Not on file  Social History Narrative   3 children, 9 grandchildren   Social Drivers of Corporate investment banker Strain: Low Risk  (05/20/2023)   Overall Financial Resource Strain (CARDIA)    Difficulty of Paying Living Expenses: Not hard at all  Food Insecurity: No Food Insecurity (05/20/2023)   Hunger Vital Sign    Worried About Running Out of Food in the Last Year: Never true    Ran Out of Food in the Last Year: Never true   Transportation Needs: No Transportation Needs (05/20/2023)   PRAPARE - Administrator, Civil Service (Medical): No    Lack of Transportation (Non-Medical): No  Physical Activity: Sufficiently Active (05/20/2023)   Exercise Vital Sign    Days of Exercise per Week: 6 days    Minutes of Exercise per Session: 90 min  Stress: No Stress Concern Present (05/20/2023)   [employer] of Occupational Health - Occupational Stress Questionnaire    Feeling of Stress : Only a little  Social Connections: Moderately Integrated (05/20/2023)   Social Connection and Isolation Panel [NHANES]    Frequency of Communication with Friends and Family: More than three times a week    Frequency of Social Gatherings with Friends and Family: More than three times a week    Attends Religious Services: More than 4 times per year    Active Member of [employer] or Organizations: Yes    Attends Engineer, structural: More than 4 times per year    Marital Status: Divorced    Tobacco Counseling Counseling given: Not Answered Tobacco comments: quit 2000, smoked about 1.5 packs for about 5 years    Clinical Intake:  Pre-visit preparation completed: Yes  Pain : No/denies pain     BMI - recorded: 29.53 Nutritional Status: BMI 25 -29 Overweight Nutritional Risks: None Diabetes: Yes CBG done?: No Did pt. bring in CBG monitor from home?: No  How often do you need to have someone help you when you read instructions, pamphlets, or other written materials from your doctor or pharmacy?: 1 - Never  Interpreter Needed?: No  Information entered by :: Tora Kindred, CMA   Activities of Daily Living     05/20/2023    8:59 AM 05/16/2023    9:47 AM  In your present state of health, do you have any difficulty performing the following activities:  Hearing? 0 0  Vision? 0 1  Difficulty concentrating or making decisions? 0 0  Walking or climbing stairs? 0 0  Dressing or bathing? 0 0  Doing errands, shopping?  0 0  Preparing  Food and eating ? N N  Using the Toilet? N N  In the past six months, have you accidently leaked urine? N N  Do you have problems with loss of bowel control? N N  Managing your Medications? N N  Managing your Finances? N N  Housekeeping or managing your Housekeeping? N N    Patient Care Team: Malva Limes, MD as PCP - General (Family Medicine) Schnier, Latina Craver, MD (Vascular Surgery) Felecia Shelling, DPM as Consulting Physician (Podiatry) [facility]  Indicate any recent Medical Services you may have received from other than [facility] providers in the past year (date may be approximate).     Assessment:   This is a routine wellness examination for Dustin Guerra.  Hearing/Vision screen Hearing Screening - Comments:: Denies hearing loss Vision Screening - Comments:: Needs eye exam, last exam 2016, gave list of doctors in [place]   Goals Addressed             This Visit's Progress    Patient Stated       Lose 20 lbs and get iron deficiency up       Depression Screen     05/20/2023    9:09 AM 11/19/2022    8:36 AM 08/12/2022   11:05 AM 06/10/2022    2:40 PM 12/13/2021   10:15 AM 06/04/2021   11:00 AM 08/31/2020    9:50 AM  PHQ 2/9 Scores  PHQ - 2 Score 0 0 0 0 0 0 0  PHQ- 9 Score 0 0 0  0 1 1    Fall Risk     05/20/2023    9:13 AM 05/16/2023    9:47 AM 08/12/2022   11:05 AM 06/06/2022   12:31 PM 06/04/2021   11:01 AM  Fall Risk   Falls in the past year? 0 0 0 0 0  Number falls in past yr: 0 0 0  0  Injury with Fall? 0 0 0  0  Risk for fall due to : No Fall Risks  No Fall Risks  No Fall Risks  Follow up Falls prevention discussed;Falls evaluation completed  Falls evaluation completed  Falls evaluation completed    MEDICARE RISK AT HOME:  Medicare Risk at Home Any stairs in or around the home?: Yes If so, are there any without handrails?: No Home free of loose throw rugs in walkways, pet beds, electrical cords, etc?: Yes Adequate lighting in  your home to reduce risk of falls?: Yes Life alert?: No Use of a cane, walker or w/c?: No Grab bars in the bathroom?: Yes Shower chair or bench in shower?: No Elevated toilet seat or a handicapped toilet?: No  TIMED UP AND GO:  Was the test performed?  No  Cognitive Function: 6CIT completed        05/20/2023    9:13 AM 06/10/2022    2:43 PM  6CIT Screen  What Year? 0 points 0 points  What month? 0 points 0 points  What time? 0 points 0 points  Count back from 20 0 points 0 points  Months in reverse 0 points 0 points  Repeat phrase 0 points 0 points  Total Score 0 points 0 points    Immunizations Immunization History  Administered Date(s) Administered   Tdap 08/27/2016   Zoster Recombinant(Shingrix) 12/09/2017, 03/26/2018    Screening Tests Health Maintenance  Topic Date Due   Pneumonia Vaccine 62+ Years old (1 of 2 - PCV) Never done  OPHTHALMOLOGY EXAM  Never done   COVID-19 Vaccine (1 - 2024-25 season) Never done   HEMOGLOBIN A1C  12/23/2022   INFLUENZA VACCINE  06/15/2023 (Originally 10/16/2022)   Diabetic kidney evaluation - eGFR measurement  09/27/2023   Diabetic kidney evaluation - Urine ACR  03/18/2024   Medicare Annual Wellness (AWV)  05/19/2024   Colonoscopy  03/21/2025   DTaP/Tdap/Td (2 - Td or Tdap) 08/28/2026   Hepatitis C Screening  Completed   HIV Screening  Completed   Zoster Vaccines- Shingrix  Completed   HPV VACCINES  Aged Out    Health Maintenance  Health Maintenance Due  Topic Date Due   Pneumonia Vaccine 48+ Years old (1 of 2 - PCV) Never done   OPHTHALMOLOGY EXAM  Never done   COVID-19 Vaccine (1 - 2024-25 season) Never done   HEMOGLOBIN A1C  12/23/2022   Health Maintenance Items Addressed: See Nurse Notes  Additional Screening:  Vision Screening: Recommended annual ophthalmology exams for early detection of glaucoma and other disorders of the eye.  Dental Screening: Recommended annual dental exams for proper oral  hygiene  Community Resource Referral / Chronic Care Management: CRR required this visit?  No   CCM required this visit?  No     Plan:     I have personally reviewed and noted the following in the patient's chart:   Medical and social history Use of alcohol, tobacco or illicit drugs  Current medications and supplements including opioid prescriptions. Patient is currently taking opioid prescriptions. Information provided to patient regarding non-opioid alternatives. Patient advised to discuss non-opioid treatment plan with their provider. Functional ability and status Nutritional status Physical activity Advanced directives List of other physicians Hospitalizations, surgeries, and ER visits in previous 12 months Vitals Screenings to include cognitive, depression, and falls Referrals and appointments  In addition, I have reviewed and discussed with patient certain preventive protocols, quality metrics, and best practice recommendations. A written personalized care plan for preventive services as well as general preventive health recommendations were provided to patient.     Tora Kindred, CMA   05/20/2023   After Visit Summary: (MyChart) Due to this being a telephonic visit, the after visit summary with patients personalized plan was offered to patient via MyChart   Notes:  Needs eye exam. Patient declined referral. States he will call and schedule appointment. Declined DM & Nutrition education Declined flu, pneumonia and covid vaccines

## 2023-05-20 NOTE — Patient Instructions (Addendum)
 [name] , Thank you for taking time to come for your Medicare Wellness Visit. I appreciate your ongoing commitment to your health goals. Please review the following plan we discussed and let me know if I can assist you in the future.   Referrals/Orders/Follow-Ups/Clinician Recommendations: Get a diabetic eye exam as soon as possible. I have included a list of doctors in the area. [facility]'s phone # is [phone].  This is a list of the screening recommended for you and due dates:  Health Maintenance  Topic Date Due   Pneumonia Vaccine (1 of 2 - PCV) Never done   Eye exam for diabetics  Never done   COVID-19 Vaccine (1 - 2024-25 season) Never done   Hemoglobin A1C  12/23/2022   Flu Shot  06/15/2023*   Yearly kidney function blood test for diabetes  09/27/2023   Yearly kidney health urinalysis for diabetes  03/18/2024   Medicare Annual Wellness Visit  05/19/2024   Colon Cancer Screening  03/21/2025   DTaP/Tdap/Td vaccine (2 - Td or Tdap) 08/28/2026   Hepatitis C Screening  Completed   HIV Screening  Completed   Zoster (Shingles) Vaccine  Completed   HPV Vaccine  Aged Out  *Topic was postponed. The date shown is not the original due date.    Advanced directives: (ACP Link)Information on Advanced Care Planning can be found at [facility] Advance Health Care Directives Advance Health Care Directives ([email or web address]/) You may also get these forms at your doctor's office.  Once you have completed the forms, please bring a copy of your health care power of attorney and living will to the office to be added to your chart at your convenience.   Next Medicare Annual Wellness Visit scheduled for next year: Yes, 05/25/24 @ 8:10am (phone visit)  There are several Eye Doctors in your area. Here are a few that usually accept all insurance types:  [facility] [address] [place], [place] [address] Phone: [phone]  [facility] [address]  [place], [place] [address] Phone: [phone]  [facility] [address] [place], [place] [address] Phone: [phone]  [facility] [address] [place], [place] [address] Phone: [phone]  [facility] [address] [place], [place] [address] Phone: [phone]  [facility] [address] [place], [place] [address] Phone: [phone]  Please let us know if you require a referral for an eye exam appointment. Thank you!   Managing Pain Without Opioids Opioids are strong medicines used to treat moderate to severe pain. For some people, especially those who have long-term (chronic) pain, opioids may not be the best choice for pain management due to: Side effects like nausea, constipation, and sleepiness. The risk of addiction (opioid use disorder). The longer you take opioids, the greater your risk of addiction. Pain that lasts for more than 3 months is called chronic pain. Managing chronic pain usually requires more than one approach and is often provided by a team of health care providers working together (multidisciplinary approach). Pain management may be done at a pain management center or pain clinic. How to manage pain without the use of opioids Use non-opioid medicines Non-opioid medicines for pain may include: Over-the-counter or prescription non-steroidal anti-inflammatory drugs (NSAIDs). These may be the first medicines used for pain. They work well for muscle and bone pain, and they reduce swelling. Acetaminophen. This over-the-counter medicine may work well for milder pain but not swelling. Antidepressants. These may  be used to treat chronic pain. A certain type of antidepressant (tricyclics) is often used. These medicines are given in lower doses for pain than when used for depression. Anticonvulsants. These are usually used to treat seizures but may also reduce nerve (neuropathic) pain. Muscle relaxants. These relieve pain caused by sudden muscle tightening  (spasms). You may also use a pain medicine that is applied to the skin as a patch, cream, or gel (topical analgesic), such as a numbing medicine. These may cause fewer side effects than medicines taken by mouth. Do certain therapies as directed Some therapies can help with pain management. They include: Physical therapy. You will do exercises to gain strength and flexibility. A physical therapist may teach you exercises to move and stretch parts of your body that are weak, stiff, or painful. You can learn these exercises at physical therapy visits and practice them at home. Physical therapy may also involve: Massage. Heat wraps or applying heat or cold to affected areas. Electrical signals that interrupt pain signals (transcutaneous electrical nerve stimulation, TENS). Weak lasers that reduce pain and swelling (low-level laser therapy). Signals from your body that help you learn to regulate pain (biofeedback). Occupational therapy. This helps you to learn ways to function at home and work with less pain. Recreational therapy. This involves trying new activities or hobbies, such as a physical activity or drawing. Mental health therapy, including: Cognitive behavioral therapy (CBT). This helps you learn coping skills for dealing with pain. Acceptance and commitment therapy (ACT) to change the way you think and react to pain. Relaxation therapies, including muscle relaxation exercises and mindfulness-based stress reduction. Pain management counseling. This may be individual, family, or group counseling.  Receive medical treatments Medical treatments for pain management include: Nerve block injections. These may include a pain blocker and anti-inflammatory medicines. You may have injections: Near the spine to relieve chronic back or neck pain. Into joints to relieve back or joint pain. Into nerve areas that supply a painful area to relieve body pain. Into muscles (trigger point injections) to  relieve some painful muscle conditions. A medical device placed near your spine to help block pain signals and relieve nerve pain or chronic back pain (spinal cord stimulation device). Acupuncture. Follow these instructions at home Medicines Take over-the-counter and prescription medicines only as told by your health care provider. If you are taking pain medicine, ask your health care providers about possible side effects to watch out for. Do not drive or use heavy machinery while taking prescription opioid pain medicine. Lifestyle  Do not use drugs or alcohol to reduce pain. If you drink alcohol, limit how much you have to: 0-1 drink a day for women who are not pregnant. 0-2 drinks a day for men. Know how much alcohol is in a drink. In the [place] one drink equals one 12 oz bottle of beer (355 mL), one 5 oz glass of wine (148 mL), or one 1 oz glass of hard liquor (44 mL). Do not use any products that contain nicotine or tobacco. These products include cigarettes, chewing tobacco, and vaping devices, such as e-cigarettes. If you need help quitting, ask your health care provider. Eat a healthy diet and maintain a healthy weight. Poor diet and excess weight may make pain worse. Eat foods that are high in fiber. These include fresh fruits and vegetables, whole grains, and beans. Limit foods that are high in fat and processed sugars, such as fried and sweet foods. Exercise regularly. Exercise lowers stress and may  help relieve pain. Ask your health care provider what activities and exercises are safe for you. If your health care provider approves, join an exercise class that combines movement and stress reduction. Examples include yoga and tai chi. Get enough sleep. Lack of sleep may make pain worse. Lower stress as much as possible. Practice stress reduction techniques as told by your therapist. General instructions Work with all your pain management providers to find the treatments that work  best for you. You are an important member of your pain management team. There are many things you can do to reduce pain on your own. Consider joining an online or in-person support group for people who have chronic pain. Keep all follow-up visits. This is important. Where to find more information You can find more information about managing pain without opioids from: American Academy of Pain Medicine: painmed.org Institute for Chronic Pain: instituteforchronicpain.org American Chronic Pain Association: theacpa.org Contact a health care provider if: You have side effects from pain medicine. Your pain gets worse or does not get better with treatments or home therapy. You are struggling with anxiety or depression. Summary Many types of pain can be managed without opioids. Chronic pain may respond better to pain management without opioids. Pain is best managed when you and a team of health care providers work together. Pain management without opioids may include non-opioid medicines, medical treatments, physical therapy, mental health therapy, and lifestyle changes. Tell your health care providers if your pain gets worse or is not being managed well enough. This information is not intended to replace advice given to you by your health care provider. Make sure you discuss any questions you have with your health care provider. Document Revised: 06/13/2020 Document Reviewed: 06/13/2020 Elsevier Patient Education  2024 [employer].

## 2023-05-23 ENCOUNTER — Other Ambulatory Visit: Payer: Self-pay | Admitting: Family Medicine

## 2023-05-23 DIAGNOSIS — I10 Essential (primary) hypertension: Secondary | ICD-10-CM

## 2023-05-28 DIAGNOSIS — G4733 Obstructive sleep apnea (adult) (pediatric): Secondary | ICD-10-CM | POA: Diagnosis not present

## 2023-06-14 ENCOUNTER — Other Ambulatory Visit: Payer: Self-pay | Admitting: Family Medicine

## 2023-06-14 DIAGNOSIS — G629 Polyneuropathy, unspecified: Secondary | ICD-10-CM

## 2023-06-24 ENCOUNTER — Ambulatory Visit (INDEPENDENT_AMBULATORY_CARE_PROVIDER_SITE_OTHER): Payer: Self-pay | Admitting: Family Medicine

## 2023-06-24 ENCOUNTER — Encounter: Payer: Self-pay | Admitting: Family Medicine

## 2023-06-24 VITALS — BP 142/79 | HR 67 | Resp 16 | Ht 74.0 in | Wt 242.7 lb

## 2023-06-24 DIAGNOSIS — G4733 Obstructive sleep apnea (adult) (pediatric): Secondary | ICD-10-CM | POA: Diagnosis not present

## 2023-06-24 DIAGNOSIS — E785 Hyperlipidemia, unspecified: Secondary | ICD-10-CM

## 2023-06-24 DIAGNOSIS — G609 Hereditary and idiopathic neuropathy, unspecified: Secondary | ICD-10-CM

## 2023-06-24 DIAGNOSIS — E1169 Type 2 diabetes mellitus with other specified complication: Secondary | ICD-10-CM

## 2023-06-24 DIAGNOSIS — G629 Polyneuropathy, unspecified: Secondary | ICD-10-CM

## 2023-06-24 DIAGNOSIS — N529 Male erectile dysfunction, unspecified: Secondary | ICD-10-CM

## 2023-06-24 DIAGNOSIS — D509 Iron deficiency anemia, unspecified: Secondary | ICD-10-CM | POA: Diagnosis not present

## 2023-06-24 DIAGNOSIS — E1159 Type 2 diabetes mellitus with other circulatory complications: Secondary | ICD-10-CM | POA: Diagnosis not present

## 2023-06-24 DIAGNOSIS — Z125 Encounter for screening for malignant neoplasm of prostate: Secondary | ICD-10-CM

## 2023-06-24 DIAGNOSIS — I152 Hypertension secondary to endocrine disorders: Secondary | ICD-10-CM | POA: Diagnosis not present

## 2023-06-24 DIAGNOSIS — I739 Peripheral vascular disease, unspecified: Secondary | ICD-10-CM | POA: Diagnosis not present

## 2023-06-24 DIAGNOSIS — Z0001 Encounter for general adult medical examination with abnormal findings: Secondary | ICD-10-CM

## 2023-06-24 DIAGNOSIS — Z0000 Encounter for general adult medical examination without abnormal findings: Secondary | ICD-10-CM

## 2023-06-24 MED ORDER — HYDROCODONE-ACETAMINOPHEN 7.5-325 MG PO TABS
ORAL_TABLET | ORAL | 0 refills | Status: DC
Start: 2023-06-24 — End: 2023-07-15

## 2023-06-24 MED ORDER — VARDENAFIL HCL 20 MG PO TABS
10.0000 mg | ORAL_TABLET | Freq: Every day | ORAL | 3 refills | Status: AC | PRN
Start: 2023-06-24 — End: ?

## 2023-06-24 NOTE — Patient Instructions (Signed)
Please review the attached list of medications and notify my office if there are any errors.  ° °Please bring all of your medications to every appointment so we can make sure that our medication list is the same as yours.  ° °I recommend that you get the Prevnar 20 vaccine to protect yourself from certain dangerous strains of pneumonia. You can get Prevnar 20 at your pharmacy, or call our office at [phone] at your earliest convenience to schedule this vaccine.   °

## 2023-06-25 ENCOUNTER — Encounter: Payer: Self-pay | Admitting: Family Medicine

## 2023-06-25 LAB — COMPREHENSIVE METABOLIC PANEL WITH GFR
ALT: 16 IU/L (ref 0–44)
AST: 19 IU/L (ref 0–40)
Albumin: 4.5 g/dL (ref 3.9–4.9)
Alkaline Phosphatase: 141 IU/L — ABNORMAL HIGH (ref 44–121)
BUN/Creatinine Ratio: 19 (ref 10–24)
BUN: 18 mg/dL (ref 8–27)
Bilirubin Total: 0.4 mg/dL (ref 0.0–1.2)
CO2: 25 mmol/L (ref 20–29)
Calcium: 9.5 mg/dL (ref 8.6–10.2)
Chloride: 100 mmol/L (ref 96–106)
Creatinine, Ser: 0.97 mg/dL (ref 0.76–1.27)
Globulin, Total: 2.5 g/dL (ref 1.5–4.5)
Glucose: 143 mg/dL — ABNORMAL HIGH (ref 70–99)
Potassium: 3.9 mmol/L (ref 3.5–5.2)
Sodium: 141 mmol/L (ref 134–144)
Total Protein: 7 g/dL (ref 6.0–8.5)
eGFR: 87 mL/min/{1.73_m2} (ref >59)

## 2023-06-25 LAB — LIPID PANEL
Chol/HDL Ratio: 3.1 ratio (ref 0.0–5.0)
Cholesterol, Total: 133 mg/dL (ref 100–199)
HDL: 43 mg/dL (ref >39)
LDL Chol Calc (NIH): 74 mg/dL (ref 0–99)
Triglycerides: 84 mg/dL (ref 0–149)
VLDL Cholesterol Cal: 16 mg/dL (ref 5–40)

## 2023-06-25 LAB — CBC
Hematocrit: 38.2 % (ref 37.5–51.0)
Hemoglobin: 12.7 g/dL — ABNORMAL LOW (ref 13.0–17.7)
MCH: 32.1 pg (ref 26.6–33.0)
MCHC: 33.2 g/dL (ref 31.5–35.7)
MCV: 97 fL (ref 79–97)
Platelets: 137 10*3/uL — ABNORMAL LOW (ref 150–450)
RBC: 3.96 x10E6/uL — ABNORMAL LOW (ref 4.14–5.80)
RDW: 13.4 % (ref 11.6–15.4)
WBC: 3.3 10*3/uL — ABNORMAL LOW (ref 3.4–10.8)

## 2023-06-25 LAB — HEMOGLOBIN A1C
Est. average glucose Bld gHb Est-mCnc: 134 mg/dL
Hgb A1c MFr Bld: 6.3 % — ABNORMAL HIGH (ref 4.8–5.6)

## 2023-06-25 LAB — PSA TOTAL (REFLEX TO FREE): Prostate Specific Ag, Serum: 1.1 ng/mL (ref 0.0–4.0)

## 2023-07-04 NOTE — Progress Notes (Signed)
 Complete physical exam   Patient: Dustin Guerra   DOB: March 05, 1956   68 y.o. Male  MRN: [id number] Visit Date: 06/24/2023  Today's healthcare provider: Jeralene Mom, MD   Chief Complaint  Patient presents with   Annual Exam    CPE.Aaron Aas No other concerns   Subjective    Discussed the use of AI scribe software for clinical note transcription with the patient, who gave verbal consent to proceed.  History of Present Illness   Dustin Guerra is a 68 year old male who presents for an annual physical exam and concerns about erectile dysfunction.  He experiences erectile dysfunction with inadequate response to Viagra  100 mg, which causes headaches. He uses Cialis for three to four consecutive days but does not achieve a full erection. He is unsure of the exact dosage of Cialis, possibly 20 mg, as he switched from 100 mg Viagra  to Cialis through an online service. He has not tried Levitra  yet.  He has been eating healthier, including a diet rich in spinach and asparagus, and avoids iron supplements due to adverse effects. He has been in a relationship for about a year with a partner who cooks healthy meals, which include fresh produce and vegetables. He continues to use a CPAP machine effectively.  He experiences weeping eyes in the morning and floaters but can see fine otherwise, though he requires reading glasses for close work. He acknowledges the need to see an eye doctor.  He continues on hydrocodone /apap and high dose of pregabalin  for neuropathy and advanced arthritis which remain moderately effective although he continues to have pain every day.        Past Medical History:  Diagnosis Date   Anxiety    Arthritis    Charcot's joint    left ankle   Diabetes mellitus without complication (HCC)    GERD (gastroesophageal reflux disease)    Hypertension    Neuropathy    bilateral feet per pt   Plantar fasciitis 08/04/2015   PONV (postoperative nausea and  vomiting)    Prostate cancer screening 11/19/2022   Sleep apnea    Past Surgical History:  Procedure Laterality Date   BACK SURGERY     COLON SURGERY  2015   Colon resection due to diverticulosis   COLONOSCOPY WITH PROPOFOL  N/A 03/20/2022   Procedure: COLONOSCOPY WITH PROPOFOL ;  Surgeon: Luke Salaam, MD;  Location: [facility] ENDOSCOPY;  Service: Gastroenterology;  Laterality: N/A;   COLONOSCOPY WITH PROPOFOL  N/A 03/21/2022   Procedure: COLONOSCOPY WITH PROPOFOL ;  Surgeon: Luke Salaam, MD;  Location: [facility] ENDOSCOPY;  Service: Gastroenterology;  Laterality: N/A;   ESOPHAGOGASTRODUODENOSCOPY (EGD) WITH PROPOFOL  N/A 03/20/2022   Procedure: ESOPHAGOGASTRODUODENOSCOPY (EGD) WITH PROPOFOL ;  Surgeon: Luke Salaam, MD;  Location: [facility] ENDOSCOPY;  Service: Gastroenterology;  Laterality: N/A;   ESOPHAGOGASTRODUODENOSCOPY (EGD) WITH PROPOFOL  N/A 03/21/2022   Procedure: ESOPHAGOGASTRODUODENOSCOPY (EGD) WITH PROPOFOL ;  Surgeon: Luke Salaam, MD;  Location: [facility] ENDOSCOPY;  Service: Gastroenterology;  Laterality: N/A;   HERNIA REPAIR     SPINE SURGERY     Rupture disc   VEIN LIGATION AND STRIPPING     VEIN SURGERY     Vein stripping at age 55-25   VENTRAL HERNIA REPAIR N/A 11/22/2020   Procedure: OPEN VENTRAL INCISIONAL HERNIA REPAIR WITH MESH PATCH;  Surgeon: Oralee Billow, MD;  Location: [facility] ORS;  Service: General;  Laterality: N/A;   Social History   Socioeconomic History   Marital status: Divorced    Spouse name: Not on file  Number of children: 3   Years of education: Not on file   Highest education level: Associate degree: occupational, technical, or vocational program  Occupational History   Not on file  Tobacco Use   Smoking status: Former    Current packs/day: 0.00    Average packs/day: 1.5 packs/day for 10.0 years (15.0 ttl pk-yrs)    Types: Cigarettes    Start date: 08/14/1988    Quit date: 08/15/1998    Years since quitting: 24.9    Passive exposure: Never   Smokeless tobacco: Former     Types: Chew    Quit date: 2015   Tobacco comments:    quit 2000, smoked about 1.5 packs for about 5 years  Vaping Use   Vaping status: Never Used  Substance and Sexual Activity   Alcohol use: Yes    Alcohol/week: 3.0 - 6.0 standard drinks of alcohol    Types: 3 - 6 Glasses of wine per week    Comment: 1-2 glasses of Red wine before bed x 3 days a week   Drug use: No   Sexual activity: Not Currently    Birth control/protection: Condom  Other Topics Concern   Not on file  Social History Narrative   3 children, 9 grandchildren   Social Drivers of Corporate investment banker Strain: Low Risk  (05/20/2023)   Overall Financial Resource Strain (CARDIA)    Difficulty of Paying Living Expenses: Not hard at all  Food Insecurity: No Food Insecurity (05/20/2023)   Hunger Vital Sign    Worried About Running Out of Food in the Last Year: Never true    Ran Out of Food in the Last Year: Never true  Transportation Needs: No Transportation Needs (05/20/2023)   PRAPARE - Administrator, Civil Service (Medical): No    Lack of Transportation (Non-Medical): No  Physical Activity: Sufficiently Active (05/20/2023)   Exercise Vital Sign    Days of Exercise per Week: 6 days    Minutes of Exercise per Session: 90 min  Stress: No Stress Concern Present (05/20/2023)   [employer] of Occupational Health - Occupational Stress Questionnaire    Feeling of Stress : Only a little  Social Connections: Moderately Integrated (05/20/2023)   Social Connection and Isolation Panel [NHANES]    Frequency of Communication with Friends and Family: More than three times a week    Frequency of Social Gatherings with Friends and Family: More than three times a week    Attends Religious Services: More than 4 times per year    Active Member of [employer] or Organizations: Yes    Attends Banker Meetings: More than 4 times per year    Marital Status: Divorced  Intimate Partner Violence: Not At Risk  (05/20/2023)   Humiliation, Afraid, Rape, and Kick questionnaire    Fear of Current or Ex-Partner: No    Emotionally Abused: No    Physically Abused: No    Sexually Abused: No   Family Status  Relation Name Status   Mother Lona Rist Deceased at age 7   Father Kaymen Adrian Deceased at age 22   Brother  Deceased at age 63  No partnership data on file   Family History  Problem Relation Age of Onset   Alcohol abuse Mother    Esophageal cancer Father    Cancer Father    Heart failure Brother    Allergies  Allergen Reactions   Bee Venom Itching and Swelling   Ibuprofen  No ibuprofen per pt due to another drug he takes    Lisinopril  Swelling    angioedema    Patient Care Team: Lamon Pillow, MD as PCP - General (Family Medicine) Schnier, Ninette Basque, MD (Vascular Surgery) Dot Gazella, DPM as Consulting Physician (Podiatry) [facility]   Medications: Outpatient Medications Prior to Visit  Medication Sig   amLODipine  (NORVASC ) 10 MG tablet TAKE ONE TABLET BY MOUTH EVERY EVENING   Ascorbic Acid  (VITAMIN C ) 1000 MG tablet Take 1,000 mg by mouth daily.   Azelaic Acid  15 % FOAM Apply topically.   Cholecalciferol  (VITAMIN D3) 50 MCG (2000 UT) TABS Take 1 tablet by mouth daily.   cilostazol  (PLETAL ) 50 MG tablet TAKE ONE TABLET BY MOUTH TWICE A DAY   diazepam  (VALIUM ) 10 MG tablet TAKE 1 TABLET BY MOUTH DAILY AS NEEDED FOR ANXIETY   diclofenac  Sodium (VOLTAREN ) 1 % GEL Apply 2 g topically 4 (four) times daily.   diphenhydrAMINE  (BENADRYL  ALLERGY) 25 mg capsule Take 1 capsule (25 mg total) by mouth every 6 (six) hours as needed for itching or allergies.   EPINEPHrine  0.3 mg/0.3 mL IJ SOAJ injection Inject 0.3 mg into the skin as needed.   hydrochlorothiazide  (HYDRODIURIL ) 12.5 MG tablet TAKE 1 TABLET BY MOUTH DAILY   hydrOXYzine  (VISTARIL ) 25 MG capsule Take 1 capsule (25 mg total) by mouth every 8 (eight) hours as needed.   lansoprazole  (PREVACID ) 30 MG capsule  TAKE ONE CAPSULE BY MOUTH DAILY AT 12 NOON   magnesium  oxide (MAG-OX) 400 MG tablet Take 1 tablet by mouth daily.   meclizine  (ANTIVERT ) 25 MG tablet Take 1 tablet (25 mg total) by mouth 3 (three) times daily as needed for dizziness.   meloxicam  (MOBIC ) 15 MG tablet TAKE 1 TABLET BY MOUTH DAILY AS NEEDED   polyethylene glycol powder (GLYCOLAX /MIRALAX ) 17 GM/SCOOP powder Take 17 g by mouth as needed for mild constipation.   pravastatin  (PRAVACHOL ) 40 MG tablet TAKE 1 TABLET BY MOUTH DAILY   pregabalin  (LYRICA ) 225 MG capsule TAKE 1 CAPSULE BY MOUTH 3 TIMES A DAY   psyllium (METAMUCIL) 58.6 % powder Take 1 packet by mouth 3 (three) times daily. Large spoonful in a cup of water  per day   selenium  200 MCG TABS tablet Take 200 mcg by mouth daily.   sildenafil  (VIAGRA ) 100 MG tablet TAKE 1/2 TO 1 TABLET BY MOUTH DAILY AS NEEDED FOR ERECTILE DYSFUNCTION   silver  sulfADIAZINE  (SILVADENE ) 1 % cream Apply 1 Application topically daily.   traMADol  (ULTRAM ) 50 MG tablet TAKE 1 TABLET BY MOUTH 3 TIMES A DAY AS NEEDED   vitamin B-12 (CYANOCOBALAMIN ) 500 MCG tablet Take 500 mcg by mouth daily.   vitamin E  180 MG (400 UNITS) capsule Take 400 Units by mouth daily.   Zinc  Sulfate 220 (50 Zn) MG TABS Take 1 tablet by mouth daily.   [DISCONTINUED] HYDROcodone -acetaminophen  (NORCO) 7.5-325 MG tablet 1/2-1 tablet up to four times a day as needed   No facility-administered medications prior to visit.    Review of Systems  Respiratory: Negative.  Negative for cough, shortness of breath and wheezing.   Cardiovascular:  Negative for chest pain, palpitations and leg swelling.  Neurological:  Positive for numbness. Negative for weakness and headaches.      Objective    BP (!) 142/79 (BP Location: Right Arm, Patient Position: Sitting, Cuff Size: Large)   Pulse 67   Resp 16   Ht 6\' 2"  (1.88 m)   Wt  242 lb 11.2 oz (110.1 kg)   SpO2 96%   BMI 31.16 kg/m    Physical Exam  General Appearance:    Mildly obese  male. Alert, cooperative, in no acute distress, appears stated age  Head:    Normocephalic, without obvious abnormality, atraumatic  Eyes:    PERRL, conjunctiva/corneas clear, EOM's intact, fundi    benign, both eyes       Ears:    Normal TM's and external ear canals, both ears  Nose:   Nares normal, septum midline, mucosa normal, no drainage   or sinus tenderness  Throat:   Lips, mucosa, and tongue normal; teeth and gums normal  Neck:   Supple, symmetrical, trachea midline, no adenopathy;       thyroid:  No enlargement/tenderness/nodules; no carotid   bruit or JVD  Back:     Symmetric, no curvature, ROM normal, no CVA tenderness  Lungs:     Clear to auscultation bilaterally, respirations unlabored  Chest wall:    No tenderness or deformity  Heart:    Normal heart rate. Normal rhythm. No murmurs, rubs, or gallops.  S1 and S2 normal  Abdomen:     Soft, non-tender, bowel sounds active all four quadrants,    no masses, no organomegaly  Genitalia:    deferred  Rectal:    deferred  Extremities:   All extremities are intact. No cyanosis or edema  Pulses:   2+ and symmetric all extremities  Skin:   Skin color, texture, turgor normal, no rashes or lesions  Lymph nodes:   Cervical, supraclavicular, and axillary nodes normal  Neurologic:   CNII-XII intact. Normal strength, sensation and reflexes      throughout       Last depression screening scores    06/24/2023    9:13 AM 05/20/2023    9:09 AM 11/19/2022    8:36 AM  PHQ 2/9 Scores  PHQ - 2 Score 0 0 0  PHQ- 9 Score 0 0 0   Last fall risk screening    05/20/2023    9:13 AM  Fall Risk   Falls in the past year? 0  Number falls in past yr: 0  Injury with Fall? 0  Risk for fall due to : No Fall Risks  Follow up Falls prevention discussed;Falls evaluation completed   Last Audit-C alcohol use screening    05/20/2023    9:07 AM  Alcohol Use Disorder Test (AUDIT)  1. How often do you have a drink containing alcohol? 3  2. How many drinks  containing alcohol do you have on a typical day when you are drinking? 0  3. How often do you have six or more drinks on one occasion? 0  AUDIT-C Score 3  4. How often during the last year have you found that you were not able to stop drinking once you had started? 0  5. How often during the last year have you failed to do what was normally expected from you because of drinking? 0  6. How often during the last year have you needed a first drink in the morning to get yourself going after a heavy drinking session? 0  7. How often during the last year have you had a feeling of guilt of remorse after drinking? 0  8. How often during the last year have you been unable to remember what happened the night before because you had been drinking? 0  9. Have you or someone else been injured as  a result of your drinking? 0  10. Has a relative or friend or a doctor or another health worker been concerned about your drinking or suggested you cut down? 0  Alcohol Use Disorder Identification Test Final Score (AUDIT) 3   A score of 3 or more in women, and 4 or more in men indicates increased risk for alcohol abuse, EXCEPT if all of the points are from question 1   Results for orders placed or performed in visit on 06/24/23  CBC  Result Value Ref Range   WBC 3.3 (L) 3.4 - 10.8 x10E3/uL   RBC 3.96 (L) 4.14 - 5.80 x10E6/uL   Hemoglobin 12.7 (L) 13.0 - 17.7 g/dL   Hematocrit 78.2 95.6 - 51.0 %   MCV 97 79 - 97 fL   MCH 32.1 26.6 - 33.0 pg   MCHC 33.2 31.5 - 35.7 g/dL   RDW 21.3 08.6 - 57.8 %   Platelets 137 (L) 150 - 450 x10E3/uL  Comprehensive metabolic panel with GFR  Result Value Ref Range   Glucose 143 (H) 70 - 99 mg/dL   BUN 18 8 - 27 mg/dL   Creatinine, Ser 4.69 0.76 - 1.27 mg/dL   eGFR 87 >62 XB/MWU/1.32   BUN/Creatinine Ratio 19 10 - 24   Sodium 141 134 - 144 mmol/L   Potassium 3.9 3.5 - 5.2 mmol/L   Chloride 100 96 - 106 mmol/L   CO2 25 20 - 29 mmol/L   Calcium 9.5 8.6 - 10.2 mg/dL   Total  Protein 7.0 6.0 - 8.5 g/dL   Albumin 4.5 3.9 - 4.9 g/dL   Globulin, Total 2.5 1.5 - 4.5 g/dL   Bilirubin Total 0.4 0.0 - 1.2 mg/dL   Alkaline Phosphatase 141 (H) 44 - 121 IU/L   AST 19 0 - 40 IU/L   ALT 16 0 - 44 IU/L  Lipid panel  Result Value Ref Range   Cholesterol, Total 133 100 - 199 mg/dL   Triglycerides 84 0 - 149 mg/dL   HDL 43 >44 mg/dL   VLDL Cholesterol Cal 16 5 - 40 mg/dL   LDL Chol Calc (NIH) 74 0 - 99 mg/dL   Chol/HDL Ratio 3.1 0.0 - 5.0 ratio  PSA Total (Reflex To Free)  Result Value Ref Range   Prostate Specific Ag, Serum 1.1 0.0 - 4.0 ng/mL   Reflex Criteria Comment   Hemoglobin A1c  Result Value Ref Range   Hgb A1c MFr Bld 6.3 (H) 4.8 - 5.6 %   Est. average glucose Bld gHb Est-mCnc 134 mg/dL    Assessment & Plan    Routine Health Maintenance and Physical Exam  Exercise Activities and Dietary recommendations  Goals      Patient Stated     Lose 20 lbs and get iron deficiency up        Immunization History  Administered Date(s) Administered   Tdap 08/27/2016   Zoster Recombinant(Shingrix ) 12/09/2017, 03/26/2018    Health Maintenance  Topic Date Due   OPHTHALMOLOGY EXAM  Never done   Pneumonia Vaccine 29+ Years old (1 of 2 - PCV) Never done   COVID-19 Vaccine (1 - 2024-25 season) Never done   INFLUENZA VACCINE  10/16/2023   HEMOGLOBIN A1C  12/24/2023   Diabetic kidney evaluation - Urine ACR  03/18/2024   Medicare Annual Wellness (AWV)  05/19/2024   Diabetic kidney evaluation - eGFR measurement  06/23/2024   Colonoscopy  03/21/2025   DTaP/Tdap/Td (2 - Td or Tdap) 08/28/2026  Hepatitis C Screening  Completed   HIV Screening  Completed   Zoster Vaccines- Shingrix   Completed   HPV VACCINES  Aged Out   Meningococcal B Vaccine  Aged Out    Discussed health benefits of physical activity, and encouraged him to engage in regular exercise appropriate for his age and condition.     Due for pneumococcal vaccine, covered by Medicare. - Recommend  pneumococcal vaccine (Prevnar 20) which he declined today , but advised he could get at pharmacy, no cost. -PSA for prostate cancer screening   Erectile Dysfunction Viagra  ineffective, causes headaches. Cialis used without full efficacy. Levitra  suggested as alternative. - Prescribe Levitra , advise cost check at [employer] using GoodRx. - Consider splitting highest dose to reduce cost.  Diabetes Mellitus/Hyperlipidemia Reports healthier diet, no recent glucose checks. check a1c and lipids   Iron deficiency anemia -CBC Cannot tolerate iron supplements.  Sleep Apnea CPAP use effective and consistent.  Ophthalmologic Symptoms Weeping eyes and floaters present. - Advise ophthalmologist appointment.            Jeralene Mom, MD  [facility] [phone] (phone) [phone] (fax)  [facility]

## 2023-07-15 ENCOUNTER — Other Ambulatory Visit: Payer: Self-pay | Admitting: Family Medicine

## 2023-07-15 DIAGNOSIS — G609 Hereditary and idiopathic neuropathy, unspecified: Secondary | ICD-10-CM

## 2023-07-15 MED ORDER — HYDROCODONE-ACETAMINOPHEN 7.5-325 MG PO TABS
ORAL_TABLET | ORAL | 0 refills | Status: DC
Start: 2023-07-15 — End: 2023-07-22

## 2023-07-15 NOTE — Telephone Encounter (Signed)
 Copied from CRM [id number]. Topic: Clinical - Medication Refill >> Jul 15, 2023 12:31 PM Star [place] wrote: Most Recent Primary Care Visit:  Provider: Lamon Pillow  Department: [facility]  Visit Type: PHYSICAL  Date: 06/24/2023  Medication: HYDROcodone -acetaminophen  (NORCO) 7.5-325 MG tablet diazepam  (VALIUM ) 10 MG tablet  Has the patient contacted their pharmacy? No (Agent: If no, request that the patient contact the pharmacy for the refill. If patient does not wish to contact the pharmacy document the reason why and proceed with request.) (Agent: If yes, when and what did the pharmacy advise?)  Is this the correct pharmacy for this prescription? Yes If no, delete pharmacy and type the correct one.  This is the patient's preferred pharmacy:  [facility] [id number] [place], [place] - [address] [place] [address] Phone: [phone] Fax: [phone]   Has the prescription been filled recently? Yes  Is the patient out of the medication? Yes  Has the patient been seen for an appointment in the last year OR does the patient have an upcoming appointment? Yes  Can we respond through MyChart? Yes  Agent: Please be advised that Rx refills may take up to 3 business days. We ask that you follow-up with your pharmacy.

## 2023-07-22 ENCOUNTER — Other Ambulatory Visit: Payer: Self-pay | Admitting: Family Medicine

## 2023-07-22 DIAGNOSIS — I10 Essential (primary) hypertension: Secondary | ICD-10-CM

## 2023-07-22 DIAGNOSIS — I739 Peripheral vascular disease, unspecified: Secondary | ICD-10-CM

## 2023-07-22 DIAGNOSIS — G609 Hereditary and idiopathic neuropathy, unspecified: Secondary | ICD-10-CM

## 2023-07-23 MED ORDER — HYDROCODONE-ACETAMINOPHEN 7.5-325 MG PO TABS
ORAL_TABLET | ORAL | 0 refills | Status: DC
Start: 2023-07-23 — End: 2023-08-19

## 2023-08-09 ENCOUNTER — Other Ambulatory Visit: Payer: Self-pay | Admitting: Family Medicine

## 2023-08-19 ENCOUNTER — Other Ambulatory Visit: Payer: Self-pay | Admitting: Family Medicine

## 2023-08-19 DIAGNOSIS — G629 Polyneuropathy, unspecified: Secondary | ICD-10-CM

## 2023-08-19 DIAGNOSIS — G609 Hereditary and idiopathic neuropathy, unspecified: Secondary | ICD-10-CM

## 2023-08-19 NOTE — Telephone Encounter (Unsigned)
 Copied from CRM [id number]. Topic: Clinical - Medication Refill >> Aug 19, 2023  4:38 PM [place] B wrote: Medication:  HYDROcodone -acetaminophen  (NORCO) 7.5-325 MG tablet    Has the patient contacted their pharmacy? no Because of type of med This is the patient's preferred pharmacy:  [facility] [id number] [place], [place] - [address] [place] [address] Phone: [phone] Fax: [phone]  Is this the correct pharmacy for this prescription? yes Has the prescription been filled recently? no  Is the patient out of the medication? no  Has the patient been seen for an appointment in the last year OR does the patient have an upcoming appointment? yes  Can we respond through MyChart? yes  Agent: Please be advised that Rx refills may take up to 3 business days. We ask that you follow-up with your pharmacy.

## 2023-08-20 NOTE — Telephone Encounter (Signed)
 Requested medications are due for refill today.  yes  Requested medications are on the active medications list.  yes  Last refill. 06/16/2023 #90 1 rf  Future visit scheduled.   yes  Notes to clinic.  Refill not delegated.    Requested Prescriptions  Pending Prescriptions Disp Refills   traMADol  (ULTRAM ) 50 MG tablet [Pharmacy Med Name: TRAMADOL  HCL 50MG  TABLET] 90 tablet     Sig: TAKE 1 TABLET BY MOUTH 3 TIMES A DAY AS NEEDED     Not Delegated - Analgesics:  Opioid Agonists Failed - 08/20/2023  4:22 PM      Failed - This refill cannot be delegated      Failed - Urine Drug Screen completed in last 360 days      Passed - Valid encounter within last 3 months    Recent Outpatient Visits           1 month ago Annual physical exam   [facility] Lamon Pillow, MD       Future Appointments             In 10 months Fisher, Erlinda Haws, MD [facility]

## 2023-08-20 NOTE — Telephone Encounter (Signed)
 Requested medications are due for refill today.  yes  Requested medications are on the active medications list.  yes  Last refill. 07/23/2023 #120 0 rf  Future visit scheduled.   yes  Notes to clinic.  Refill not delegated.    Requested Prescriptions  Pending Prescriptions Disp Refills   HYDROcodone -acetaminophen  (NORCO) 7.5-325 MG tablet 120 tablet 0    Sig: 1/2-1 tablet up to four times a day as needed     Not Delegated - Analgesics:  Opioid Agonist Combinations Failed - 08/20/2023  4:14 PM      Failed - This refill cannot be delegated      Failed - Urine Drug Screen completed in last 360 days      Passed - Valid encounter within last 3 months    Recent Outpatient Visits           1 month ago Annual physical exam   [facility] Lamon Pillow, MD       Future Appointments             In 10 months Fisher, Erlinda Haws, MD [facility]

## 2023-08-21 MED ORDER — HYDROCODONE-ACETAMINOPHEN 7.5-325 MG PO TABS: ORAL_TABLET | ORAL | 0 refills | Status: DC

## 2023-08-23 ENCOUNTER — Other Ambulatory Visit: Payer: Self-pay | Admitting: Family Medicine

## 2023-09-28 ENCOUNTER — Other Ambulatory Visit: Payer: Self-pay | Admitting: Family Medicine

## 2023-09-28 DIAGNOSIS — G609 Hereditary and idiopathic neuropathy, unspecified: Secondary | ICD-10-CM

## 2023-09-28 NOTE — Telephone Encounter (Unsigned)
 Copied from CRM [id number]. Topic: Clinical - Medication Refill >> Sep 28, 2023  2:18 PM Silvana PARAS wrote: Medication: HYDROcodone -acetaminophen  (NORCO) 7.5-325 MG tablet , and diazepam  (VALIUM ) 10 MG tablet  Has the patient contacted their pharmacy? Yes (Agent: If no, request that the patient contact the pharmacy for the refill. If patient does not wish to contact the pharmacy document the reason why and proceed with request.) (Agent: If yes, when and what did the pharmacy advise?)  This is the patient's preferred pharmacy:  [facility] [id number] [place], [place] - [address] [place] [address] Phone: [phone] Fax: [phone]  Is this the correct pharmacy for this prescription? Yes If no, delete pharmacy and type the correct one.   Has the prescription been filled recently? Yes  Is the patient out of the medication? No  Has the patient been seen for an appointment in the last year OR does the patient have an upcoming appointment? Yes  Can we respond through MyChart? Yes  Agent: Please be advised that Rx refills may take up to 3 business days. We ask that you follow-up with your pharmacy.

## 2023-09-30 MED ORDER — DIAZEPAM 10 MG PO TABS: 10.0000 mg | ORAL_TABLET | Freq: Every day | ORAL | 3 refills | Status: DC | PRN

## 2023-09-30 MED ORDER — HYDROCODONE-ACETAMINOPHEN 7.5-325 MG PO TABS: ORAL_TABLET | ORAL | 0 refills | Status: DC

## 2023-09-30 NOTE — Telephone Encounter (Signed)
 Requested medication (s) are due for refill today -yes  Requested medication (s) are on the active medication list -yes  Future visit scheduled -no  Last refill: hydrocodone - 08/21/23 #120                 Diazepam -07/15/23 #15 3RF  Notes to clinic: non delegated Rx  Requested Prescriptions  Pending Prescriptions Disp Refills   HYDROcodone -acetaminophen  (NORCO) 7.5-325 MG tablet 120 tablet 0    Sig: 1/2-1 tablet up to four times a day as needed     Not Delegated - Analgesics:  Opioid Agonist Combinations Failed - 09/30/2023 10:37 AM      Failed - This refill cannot be delegated      Failed - Urine Drug Screen completed in last 360 days      Failed - Valid encounter within last 3 months    Recent Outpatient Visits           3 months ago Annual physical exam   [facility] Nancyann BRAVO, MD       Future Appointments             In 8 months Fisher, Nancyann BRAVO, MD [facility]             diazepam  (VALIUM ) 10 MG tablet 15 tablet 3    Sig: Take 1 tablet (10 mg total) by mouth daily as needed. for anxiety     Not Delegated - Psychiatry: Anxiolytics/Hypnotics 2 Failed - 09/30/2023 10:37 AM      Failed - This refill cannot be delegated      Failed - Urine Drug Screen completed in last 360 days      Passed - Patient is not pregnant      Passed - Valid encounter within last 6 months    Recent Outpatient Visits           3 months ago Annual physical exam   [facility] Gasper Nancyann BRAVO, MD       Future Appointments             In 8 months Fisher, Nancyann BRAVO, MD [facility], [facility]               Requested Prescriptions  Pending Prescriptions Disp Refills   HYDROcodone -acetaminophen  (NORCO) 7.5-325 MG tablet 120 tablet 0    Sig: 1/2-1 tablet up to four times a day as needed     Not Delegated - Analgesics:  Opioid Agonist Combinations Failed - 09/30/2023  10:37 AM      Failed - This refill cannot be delegated      Failed - Urine Drug Screen completed in last 360 days      Failed - Valid encounter within last 3 months    Recent Outpatient Visits           3 months ago Annual physical exam   [facility] Gas[facility] Nancyann BRAVO, MD       Future Appointments             In 8 months Fisher, Nancyann BRAVO, MD [facility]             diazepam  (VALIUM ) 10 MG tablet 15 tablet 3    Sig: Take 1 tablet (10 mg total) by mouth daily as needed. for anxiety     Not Delegated - Psychiatry: Anxiolytics/Hypnotics 2 Failed - 09/30/2023  10:37 AM      Failed - This refill cannot be delegated      Failed - Urine Drug Screen completed in last 360 days      Passed - Patient is not pregnant      Passed - Valid encounter within last 6 months    Recent Outpatient Visits           3 months ago Annual physical exam   [facility] Gasper Nancyann BRAVO, MD       Future Appointments             In 8 months Fisher, Nancyann BRAVO, MD [facility]

## 2023-10-01 DIAGNOSIS — G4733 Obstructive sleep apnea (adult) (pediatric): Secondary | ICD-10-CM | POA: Diagnosis not present

## 2023-10-09 ENCOUNTER — Other Ambulatory Visit: Payer: Self-pay | Admitting: Family Medicine

## 2023-10-09 DIAGNOSIS — G629 Polyneuropathy, unspecified: Secondary | ICD-10-CM

## 2023-10-17 ENCOUNTER — Other Ambulatory Visit: Payer: Self-pay | Admitting: Family Medicine

## 2023-10-17 DIAGNOSIS — I10 Essential (primary) hypertension: Secondary | ICD-10-CM

## 2023-10-18 ENCOUNTER — Other Ambulatory Visit: Payer: Self-pay | Admitting: Family Medicine

## 2023-10-18 DIAGNOSIS — G629 Polyneuropathy, unspecified: Secondary | ICD-10-CM

## 2023-11-18 ENCOUNTER — Other Ambulatory Visit: Payer: Self-pay | Admitting: Family Medicine

## 2023-11-18 DIAGNOSIS — G609 Hereditary and idiopathic neuropathy, unspecified: Secondary | ICD-10-CM

## 2023-11-18 NOTE — Telephone Encounter (Unsigned)
 Copied from CRM #[id number]. Topic: Clinical - Medication Refill >> Nov 18, 2023 10:07 AM Zy'onna H wrote: Medication: HYDROcodone -acetaminophen  (NORCO) 7.5-325 MG tablet  Has the patient contacted their pharmacy? Yes (Agent: If no, request that the patient contact the pharmacy for the refill. If patient does not wish to contact the pharmacy document the reason why and proceed with request.) (Agent: If yes, when and what did the pharmacy advise?)  This is the patient's preferred pharmacy:  [facility] [id number] [place], [place] - [address] [place] [address] Phone: [phone] Fax: [phone]  Is this the correct pharmacy for this prescription? Yes If no, delete pharmacy and type the correct one.   Has the prescription been filled recently? Yes  Is the patient out of the medication? Yes  Has the patient been seen for an appointment in the last year OR does the patient have an upcoming appointment? Yes  Can we respond through MyChart? Yes  Agent: Please be advised that Rx refills may take up to 3 business days. We ask that you follow-up with your pharmacy.

## 2023-11-19 NOTE — Telephone Encounter (Signed)
 Requested medication (s) are due for refill today: yes  Requested medication (s) are on the active medication list: yes  Last refill:  09/30/23  Future visit scheduled: yes  Notes to clinic:  Unable to refill per protocol, cannot delegate.      Requested Prescriptions  Pending Prescriptions Disp Refills   HYDROcodone -acetaminophen  (NORCO) 7.5-325 MG tablet 120 tablet 0    Sig: 1/2-1 tablet up to four times a day as needed     Not Delegated - Analgesics:  Opioid Agonist Combinations Failed - 11/19/2023  8:16 AM      Failed - This refill cannot be delegated      Failed - Urine Drug Screen completed in last 360 days      Failed - Valid encounter within last 3 months    Recent Outpatient Visits           4 months ago Annual physical exam   [facility] Gas[facility] Nancyann BRAVO, MD       Future Appointments             In 7 months Fisher, Nancyann BRAVO, MD [facility] [place]

## 2023-11-20 MED ORDER — HYDROCODONE-ACETAMINOPHEN 7.5-325 MG PO TABS: ORAL_TABLET | ORAL | 0 refills | Status: DC

## 2023-12-17 ENCOUNTER — Other Ambulatory Visit: Payer: Self-pay | Admitting: Family Medicine

## 2023-12-17 DIAGNOSIS — G609 Hereditary and idiopathic neuropathy, unspecified: Secondary | ICD-10-CM

## 2023-12-17 NOTE — Telephone Encounter (Signed)
 Copied from CRM [id number]. Topic: Clinical - Medication Refill >> Dec 17, 2023 12:36 PM Darshell M wrote: Medication HYDROcodone -acetaminophen  (NORCO) 7.5-325 MG tablet     Has the patient contacted their pharmacy? No (Agent: If no, request that the patient contact the pharmacy for the refill. If patient does not wish to contact the pharmacy document the reason why and proceed with request.) (Agent: If yes, when and what did the pharmacy advise?)  This is the patient's preferred pharmacy:  [facility] [id number] [place], [place] - [address] [place] [address] Phone: [phone] Fax: [phone]  Is this the correct pharmacy for this prescription? Yes If no, delete pharmacy and type the correct one.   Has the prescription been filled recently? Yes  Is the patient out of the medication? No  Has the patient been seen for an appointment in the last year OR does the patient have an upcoming appointment? Yes  Can we respond through MyChart? Yes  Agent: Please be advised that Rx refills may take up to 3 business days. We ask that you follow-up with your pharmacy.

## 2023-12-18 NOTE — Telephone Encounter (Signed)
 Requested medication (s) are due for refill today: yes  Requested medication (s) are on the active medication list: yes  Last refill:  11/20/23  Future visit scheduled: no  Notes to clinic:  Unable to refill per protocol, cannot delegate.      Requested Prescriptions  Pending Prescriptions Disp Refills   HYDROcodone -acetaminophen  (NORCO) 7.5-325 MG tablet 120 tablet 0    Sig: 1/2-1 tablet up to four times a day as needed     Not Delegated - Analgesics:  Opioid Agonist Combinations Failed - 12/18/2023  2:19 PM      Failed - This refill cannot be delegated      Failed - Urine Drug Screen completed in last 360 days      Failed - Valid encounter within last 3 months    Recent Outpatient Visits           5 months ago Annual physical exam   [facility] Gas[facility] Nancyann BRAVO, MD       Future Appointments             In 6 months Fisher, Nancyann BRAVO, MD [facility] [place]

## 2023-12-19 MED ORDER — HYDROCODONE-ACETAMINOPHEN 7.5-325 MG PO TABS: ORAL_TABLET | ORAL | 0 refills | Status: DC

## 2024-01-13 NOTE — Progress Notes (Signed)
 Dustin Guerra                                          MRN: [id number]   01/13/2024   The [facility] Quality Team Specialist reviewed this patient medical record for the purposes of chart review for care gap closure. The following were reviewed: chart review for care gap closure-controlling blood pressure.    [facility] Quality Team

## 2024-01-17 ENCOUNTER — Other Ambulatory Visit: Payer: Self-pay | Admitting: Family Medicine

## 2024-01-17 DIAGNOSIS — I10 Essential (primary) hypertension: Secondary | ICD-10-CM

## 2024-01-21 ENCOUNTER — Ambulatory Visit (INDEPENDENT_AMBULATORY_CARE_PROVIDER_SITE_OTHER): Admitting: Physician Assistant

## 2024-01-21 ENCOUNTER — Encounter: Payer: Self-pay | Admitting: Physician Assistant

## 2024-01-21 ENCOUNTER — Ambulatory Visit: Payer: Self-pay

## 2024-01-21 VITALS — BP 158/92 | HR 91 | Temp 98.0°F | Resp 14 | Ht 74.0 in | Wt 242.3 lb

## 2024-01-21 DIAGNOSIS — R1013 Epigastric pain: Secondary | ICD-10-CM | POA: Diagnosis not present

## 2024-01-21 DIAGNOSIS — K5909 Other constipation: Secondary | ICD-10-CM

## 2024-01-21 MED ORDER — FAMOTIDINE 20 MG PO TABS: 20.0000 mg | ORAL_TABLET | Freq: Two times a day (BID) | ORAL | 1 refills | Status: DC

## 2024-01-21 NOTE — Telephone Encounter (Signed)
 FYI Only or Action Required?: FYI only for provider: appointment scheduled on 01/21/2024.  Patient was last seen in primary care on 06/24/2023 by Dustin Nancyann BRAVO, MD.  Called Nurse Triage reporting Gas.  Symptoms began today.  Interventions attempted: OTC medications: Tums, Prescription medications: Prevacid , and Rest, hydration, or home remedies.  Symptoms are: gradually worsening.  Triage Disposition: See HCP Within 4 Hours (Or PCP Triage)  Patient/caregiver understands and will follow disposition?: Yes          Copied from CRM #[id number]. Topic: Clinical - Red Word Triage >> Jan 21, 2024  1:47 PM Amy B wrote: Red Word that prompted transfer to Nurse Triage: severe indigestion, dry heaving Reason for Disposition  [1] MILD-MODERATE pain AND [2] constant AND [3] present > 2 hours  Answer Assessment - Initial Assessment Questions This RN spoke with pt's fiance regarding his symptoms.   1. LOCATION: Where does it hurt?      Upper stomach gas  2. ONSET: When did the pain begin? (Minutes, hours or days ago)      This morning  3. PATTERN Does the pain come and go, or is it constant?     Constant  4. SEVERITY: How bad is the pain?  (e.g., Scale 1-10; mild, moderate, or severe)     Moderate to severe  5. RECURRENT SYMPTOM: Have you ever had this type of stomach pain before? If Yes, ask: When was the last time? and What happened that time?      Yes  6. CAUSE: What do you think is causing the stomach pain? (e.g., gallstones, recent abdominal surgery)     Hx of acid reflux  7. OTHER SYMPTOMS: Do you have any other symptoms? (e.g., back pain, diarrhea, fever, urination pain, vomiting)       Nausea, indigestion and dry heaving  Protocols used: Abdominal Pain - Male-A-AH

## 2024-01-21 NOTE — Progress Notes (Signed)
 Established patient visit  Patient: Dustin Guerra   DOB: 1955-06-29   68 y.o. Male  MRN: [id number] Visit Date: 01/21/2024  Today's healthcare provider: Jolynn Spencer, PA-C   Chief Complaint  Patient presents with   Abdominal Pain    Onset 8 am this morning along with indigestion. Otc: tums. Took miralax  last night. SOB with bloating   Subjective     Discussed the use of AI scribe software for clinical note transcription with the patient, who gave verbal consent to proceed.  History of Present Illness Dustin Guerra is a 68 year old male who presents with constipation and bloating.  He experiences constipation that resolves with Miralax , resulting in soft stools and comfort. Without Miralax , he has difficulty with bowel movements. Bloating and discomfort occur in the epigastric region, extending up through the esophagus, with frequent burping and gas passage, especially when lying on his left side. He recalls a similar episode years ago and suspects dietary triggers like broccoli and cauliflower. He denies fever or urination problems but experiences cold chills. He takes vitamins and regular medications, avoiding recent use of Aleve or ibuprofen.       06/24/2023    9:13 AM 05/20/2023    9:09 AM 11/19/2022    8:36 AM  Depression screen PHQ 2/9  Decreased Interest 0 0 0  Down, Depressed, Hopeless 0 0 0  PHQ - 2 Score 0 0 0  Altered sleeping 0 0 0  Tired, decreased energy 0 0 0  Change in appetite 0 0 0  Feeling bad or failure about yourself  0 0 0  Trouble concentrating 0 0 0  Moving slowly or fidgety/restless 0 0 0  Suicidal thoughts 0 0 0  PHQ-9 Score 0  0  0   Difficult doing work/chores Not difficult at all Not difficult at all Not difficult at all     Data saved with a previous flowsheet row definition      06/24/2023    9:13 AM  GAD 7 : Generalized Anxiety Score  Nervous, Anxious, on Edge 0  Control/stop worrying 0  Worry too much - different things 0   Trouble relaxing 0  Restless 0  Easily annoyed or irritable 0  Afraid - awful might happen 0  Total GAD 7 Score 0  Anxiety Difficulty Not difficult at all    Medications: Outpatient Medications Prior to Visit  Medication Sig   amLODipine  (NORVASC ) 10 MG tablet TAKE 1 TABLET BY MOUTH EVERY EVENING   Ascorbic Acid  (VITAMIN C ) 1000 MG tablet Take 1,000 mg by mouth daily.   Azelaic Acid  15 % FOAM Apply topically.   Cholecalciferol  (VITAMIN D3) 50 MCG (2000 UT) TABS Take 1 tablet by mouth daily.   cilostazol  (PLETAL ) 50 MG tablet TAKE 1 TABLET BY MOUTH 2 TIMES A DAY   diazepam  (VALIUM ) 10 MG tablet Take 1 tablet (10 mg total) by mouth daily as needed. for anxiety   diclofenac  Sodium (VOLTAREN ) 1 % GEL Apply 2 g topically 4 (four) times daily.   diphenhydrAMINE  (BENADRYL  ALLERGY) 25 mg capsule Take 1 capsule (25 mg total) by mouth every 6 (six) hours as needed for itching or allergies.   EPINEPHrine  0.3 mg/0.3 mL IJ SOAJ injection Inject 0.3 mg into the skin as needed.   hydrochlorothiazide  (HYDRODIURIL ) 12.5 MG tablet TAKE 1 TABLET BY MOUTH DAILY   HYDROcodone -acetaminophen  (NORCO) 7.5-325 MG tablet 1/2-1 tablet up to four times a day as needed   hydrOXYzine  (VISTARIL ) 25 MG  capsule Take 1 capsule (25 mg total) by mouth every 8 (eight) hours as needed.   lansoprazole  (PREVACID ) 30 MG capsule TAKE 1 CAPSULE BY MOUTH DAILY AT NOON   magnesium  oxide (MAG-OX) 400 MG tablet Take 1 tablet by mouth daily.   meclizine  (ANTIVERT ) 25 MG tablet Take 1 tablet (25 mg total) by mouth 3 (three) times daily as needed for dizziness.   meloxicam  (MOBIC ) 15 MG tablet TAKE 1 TABLET BY MOUTH DAILY AS NEEDED   polyethylene glycol powder (GLYCOLAX /MIRALAX ) 17 GM/SCOOP powder Take 17 g by mouth as needed for mild constipation.   pravastatin  (PRAVACHOL ) 40 MG tablet TAKE 1 TABLET BY MOUTH DAILY   pregabalin  (LYRICA ) 225 MG capsule TAKE 1 CAPSULE BY MOUTH 3 TIMES A DAY   psyllium (METAMUCIL) 58.6 % powder Take 1  packet by mouth 3 (three) times daily. Large spoonful in a cup of water  per day   selenium  200 MCG TABS tablet Take 200 mcg by mouth daily.   sildenafil  (VIAGRA ) 100 MG tablet TAKE 1/2 TO 1 TABLET BY MOUTH DAILY AS NEEDED FOR ERECTILE DYSFUNCTION   silver  sulfADIAZINE  (SILVADENE ) 1 % cream Apply 1 Application topically daily.   traMADol  (ULTRAM ) 50 MG tablet TAKE 1 TABLET BY MOUTH 3 TIMES A DAY AS NEEDED   vardenafil  (LEVITRA ) 20 MG tablet Take 0.5-1 tablets (10-20 mg total) by mouth daily as needed for erectile dysfunction.   vitamin B-12 (CYANOCOBALAMIN ) 500 MCG tablet Take 500 mcg by mouth daily.   vitamin E  180 MG (400 UNITS) capsule Take 400 Units by mouth daily.   Zinc  Sulfate 220 (50 Zn) MG TABS Take 1 tablet by mouth daily.   No facility-administered medications prior to visit.    Review of Systems All negative Except see HPI       Objective    BP (!) 158/92   Pulse 91   Temp 98 F (36.7 C) (Oral)   Resp 14   Ht 6' 2 (1.88 m)   Wt 242 lb 4.8 oz (109.9 kg)   SpO2 98%   BMI 31.11 kg/m     Physical Exam Vitals reviewed.  Constitutional:      General: He is not in acute distress.    Appearance: Normal appearance. He is not diaphoretic.  HENT:     Head: Normocephalic and atraumatic.  Eyes:     General: No scleral icterus.    Conjunctiva/sclera: Conjunctivae normal.  Cardiovascular:     Rate and Rhythm: Normal rate and regular rhythm.     Pulses: Normal pulses.     Heart sounds: Normal heart sounds. No murmur heard. Pulmonary:     Effort: Pulmonary effort is normal. No respiratory distress.     Breath sounds: Normal breath sounds. No wheezing or rhonchi.  Abdominal:     General: Bowel sounds are decreased. There is distension.     Tenderness: There is abdominal tenderness in the epigastric area.  Musculoskeletal:     Cervical back: Neck supple.     Right lower leg: No edema.     Left lower leg: No edema.  Lymphadenopathy:     Cervical: No cervical  adenopathy.  Skin:    General: Skin is warm and dry.     Findings: No rash.  Neurological:     Mental Status: He is alert and oriented to person, place, and time. Mental status is at baseline.  Psychiatric:        Mood and Affect: Mood normal.  Behavior: Behavior normal.      No results found for any visits on 01/21/24.      Assessment & Plan Epigastric pain with constipation and abdominal bloating Chronic constipation managed with Miralax . Epigastric pain and bloating without fever or urinary issues. Differential includes gastrointestinal infection. Previous similar episode possibly dietary related. - Prescribed new medication up to twice daily for symptom relief. - Continue Prevacid , consider increasing to twice daily after consulting Dr. Gasper. - Ordered blood work , see below. - Advised monitoring for headaches, diarrhea, and abdominal pain as side effects. - Discussed long-term medication risks: fracture, magnesium  issues, Clostridium difficile infection. - Advised dietary modifications to reduce gas, avoid broccoli and cauliflower. - Will contact Dr. Gasper for further management and potential Prevacid  dosage adjustment.  Epigastric pain (Primary)  - Comprehensive metabolic panel with GFR - CBC with Differential/Platelet - Lipase - H. pylori breath test - famotidine  (PEPCID ) 20 MG tablet; Take 1 tablet (20 mg total) by mouth 2 (two) times daily.  Dispense: 60 tablet; Refill: 1  Chronic constipation Advised high fiber diet, adequate hydration Advised to use miralax  Will follow-up   Orders Placed This Encounter  Procedures   Comprehensive metabolic panel with GFR   CBC with Differential/Platelet   Lipase   H. pylori breath test    No follow-ups on file.   The patient was advised to call back or seek an in-person evaluation if the symptoms worsen or if the condition fails to improve as anticipated.  I discussed the assessment and treatment plan with the  patient. The patient was provided an opportunity to ask questions and all were answered. The patient agreed with the plan and demonstrated an understanding of the instructions.  I, Celes Dedic, PA-C have reviewed all documentation for this visit. The documentation on 01/21/2024  for the exam, diagnosis, procedures, and orders are all accurate and complete.  Jolynn Spencer, [facility], MMS [facility] [phone] (phone) [phone] (fax)  [facility]

## 2024-01-22 ENCOUNTER — Ambulatory Visit: Payer: Self-pay | Admitting: Physician Assistant

## 2024-01-22 LAB — CBC WITH DIFFERENTIAL/PLATELET
Basophils Absolute: 0 x10E3/uL (ref 0.0–0.2)
Basos: 0 %
EOS (ABSOLUTE): 0 x10E3/uL (ref 0.0–0.4)
Eos: 0 %
Hematocrit: 41.9 % (ref 37.5–51.0)
Hemoglobin: 14.1 g/dL (ref 13.0–17.7)
Immature Grans (Abs): 0 x10E3/uL (ref 0.0–0.1)
Immature Granulocytes: 0 %
Lymphocytes Absolute: 0.5 x10E3/uL — ABNORMAL LOW (ref 0.7–3.1)
Lymphs: 4 %
MCH: 32.9 pg (ref 26.6–33.0)
MCHC: 33.7 g/dL (ref 31.5–35.7)
MCV: 98 fL — ABNORMAL HIGH (ref 79–97)
Monocytes Absolute: 0.6 x10E3/uL (ref 0.1–0.9)
Monocytes: 5 %
Neutrophils Absolute: 11 x10E3/uL — ABNORMAL HIGH (ref 1.4–7.0)
Neutrophils: 91 %
Platelets: 167 x10E3/uL (ref 150–450)
RBC: 4.29 x10E6/uL (ref 4.14–5.80)
RDW: 13.2 % (ref 11.6–15.4)
WBC: 12.1 x10E3/uL — ABNORMAL HIGH (ref 3.4–10.8)

## 2024-01-22 LAB — COMPREHENSIVE METABOLIC PANEL WITH GFR
ALT: 19 IU/L (ref 0–44)
AST: 22 IU/L (ref 0–40)
Albumin: 5 g/dL — ABNORMAL HIGH (ref 3.9–4.9)
Alkaline Phosphatase: 128 IU/L — ABNORMAL HIGH (ref 47–123)
BUN/Creatinine Ratio: 22 (ref 10–24)
BUN: 21 mg/dL (ref 8–27)
Bilirubin Total: 0.7 mg/dL (ref 0.0–1.2)
CO2: 23 mmol/L (ref 20–29)
Calcium: 10.1 mg/dL (ref 8.6–10.2)
Chloride: 97 mmol/L (ref 96–106)
Creatinine, Ser: 0.96 mg/dL (ref 0.76–1.27)
Globulin, Total: 2.9 g/dL (ref 1.5–4.5)
Glucose: 225 mg/dL — ABNORMAL HIGH (ref 70–99)
Potassium: 4.6 mmol/L (ref 3.5–5.2)
Sodium: 139 mmol/L (ref 134–144)
Total Protein: 7.9 g/dL (ref 6.0–8.5)
eGFR: 87 mL/min/1.73 (ref >59)

## 2024-01-22 LAB — LIPASE: Lipase: 13 U/L (ref 13–78)

## 2024-01-25 ENCOUNTER — Ambulatory Visit: Payer: Self-pay

## 2024-01-25 ENCOUNTER — Ambulatory Visit (INDEPENDENT_AMBULATORY_CARE_PROVIDER_SITE_OTHER): Admitting: Family Medicine

## 2024-01-25 ENCOUNTER — Ambulatory Visit: Admitting: Family Medicine

## 2024-01-25 ENCOUNTER — Other Ambulatory Visit: Payer: Self-pay

## 2024-01-25 ENCOUNTER — Encounter: Payer: Self-pay | Admitting: Family Medicine

## 2024-01-25 VITALS — BP 127/85 | HR 86 | Resp 16 | Ht 74.0 in | Wt 239.4 lb

## 2024-01-25 DIAGNOSIS — R140 Abdominal distension (gaseous): Secondary | ICD-10-CM

## 2024-01-25 DIAGNOSIS — K219 Gastro-esophageal reflux disease without esophagitis: Secondary | ICD-10-CM | POA: Diagnosis not present

## 2024-01-25 DIAGNOSIS — G609 Hereditary and idiopathic neuropathy, unspecified: Secondary | ICD-10-CM

## 2024-01-25 NOTE — Telephone Encounter (Signed)
 FYI Only or Action Required?: FYI only for provider: appointment scheduled on 01/25/2024.  Patient was last seen in primary care on 01/21/2024 by Ostwalt, Janna, PA-C.  Called Nurse Triage reporting Dysphagia.  Symptoms began several days ago.  Interventions attempted: Nothing.  Symptoms are: gradually worsening.  Triage Disposition: See Physician Within 24 Hours  Patient/caregiver understands and will follow disposition?: Yes         Copied from CRM [id number]. Topic: Clinical - Red Word Triage >> Jan 25, 2024  8:22 AM Tobias CROME wrote: Red Word that prompted transfer to Nurse Triage: still having trouble swallowing, requesting appointment with Dr. Gasper  or for Dr. Gasper to send GI referral Reason for Disposition  [1] Swallowing difficulty AND [2] cause unknown  (Exception: Difficulty swallowing is a chronic symptom.)  Answer Assessment - Initial Assessment Questions 1. DESCRIPTION: Tell me more about this problem. Are you  having trouble swallowing liquids, solids, or both? Any trouble with swallowing saliva (spit)?     Just solids  2. SEVERITY: How bad is the swallowing difficulty?  (Scale 1-10; or mild, moderate, severe)     Mild  3. ONSET: When did the swallowing problems begin?      Around Thursday after a  4. CAUSE: What do you think is causing the problem?  (e.g., dry mouth, food or pill stuck in throat, mouth pain, sore throat, progression of disease process such as dementia or Parkinson's disease).      recent acid reflux attack  5. CHRONIC or RECURRENT: Is this a new problem for you?  If No, ask: How long have you had this problem? (e.g., days, weeks, months)      New  6. OTHER SYMPTOMS: Do you have any other symptoms? (e.g., chest pain, difficulty breathing, mouth sores, sore throat, swollen tongue, chest pain)     Denies  Protocols used: Swallowing Difficulty-A-AH

## 2024-01-25 NOTE — Progress Notes (Signed)
 Established patient visit   Patient: Dustin Guerra   DOB: 1955-06-29   68 y.o. Male  MRN: [id number] Visit Date: 01/25/2024  Today's healthcare provider: Nancyann Perry, MD   Chief Complaint  Patient presents with   Dysphagia    Patient still having trouble swallowing, requesting GI referral   Subjective    Discussed the use of AI scribe software for clinical note transcription with the patient, who gave verbal consent to proceed.  History of Present Illness   Dustin Guerra is a 67 year old male who presents with severe acid reflux and difficulty swallowing.  He experienced a severe episode of acid reflux starting at 8 AM and lasting until 10 PM, during which he was unable to eat and experienced persistent dry heaving. He managed to drink two bottles of water  but felt significant abdominal swelling and was unable to use the restroom. He describes the sensation as 'like carrying twins' due to the bloating. He has a history of dry heaving without vomiting since childhood, but this episode was notably severe. He was previously seen by a provider who prescribed omeprazole  and another antacid, which eventually alleviated his symptoms by late evening, allowing him to rest.  Currently, he can eat small amounts, such as eggs, but experiences a burning sensation at the bottom of his esophagus, especially if he eats too quickly, which causes a sensation of food getting 'hung up'. He reports a family history of esophageal cancer; his father had difficulty swallowing due to a tumor that was eventually diagnosed as cancer.  Regarding bowel movements, he experienced constipation during the episode despite taking Miralax , which he usually takes every other night. He was eventually able to have a bowel movement later that night, followed by diarrhea the next day. His bowel movements have since returned to normal as of this morning. No current stomach pain but notes a reduction in  bloating and a possible weight loss. He is avoiding spicy foods and consuming mainly fruits and chicken soup.       Medications: Outpatient Medications Prior to Visit  Medication Sig   amLODipine  (NORVASC ) 10 MG tablet TAKE 1 TABLET BY MOUTH EVERY EVENING   Ascorbic Acid  (VITAMIN C ) 1000 MG tablet Take 1,000 mg by mouth daily.   Azelaic Acid  15 % FOAM Apply topically.   Cholecalciferol  (VITAMIN D3) 50 MCG (2000 UT) TABS Take 1 tablet by mouth daily.   cilostazol  (PLETAL ) 50 MG tablet TAKE 1 TABLET BY MOUTH 2 TIMES A DAY   diazepam  (VALIUM ) 10 MG tablet Take 1 tablet (10 mg total) by mouth daily as needed. for anxiety   diclofenac  Sodium (VOLTAREN ) 1 % GEL Apply 2 g topically 4 (four) times daily.   diphenhydrAMINE  (BENADRYL  ALLERGY) 25 mg capsule Take 1 capsule (25 mg total) by mouth every 6 (six) hours as needed for itching or allergies.   EPINEPHrine  0.3 mg/0.3 mL IJ SOAJ injection Inject 0.3 mg into the skin as needed.   famotidine  (PEPCID ) 20 MG tablet Take 1 tablet (20 mg total) by mouth 2 (two) times daily.   hydrochlorothiazide  (HYDRODIURIL ) 12.5 MG tablet TAKE 1 TABLET BY MOUTH DAILY   HYDROcodone -acetaminophen  (NORCO) 7.5-325 MG tablet 1/2-1 tablet up to four times a day as needed   hydrOXYzine  (VISTARIL ) 25 MG capsule Take 1 capsule (25 mg total) by mouth every 8 (eight) hours as needed.   lansoprazole  (PREVACID ) 30 MG capsule TAKE 1 CAPSULE BY MOUTH DAILY AT [facility]  magnesium  oxide (MAG-OX) 400 MG tablet Take 1 tablet by mouth daily.   meclizine  (ANTIVERT ) 25 MG tablet Take 1 tablet (25 mg total) by mouth 3 (three) times daily as needed for dizziness.   meloxicam  (MOBIC ) 15 MG tablet TAKE 1 TABLET BY MOUTH DAILY AS NEEDED   polyethylene glycol powder (GLYCOLAX /MIRALAX ) 17 GM/SCOOP powder Take 17 g by mouth as needed for mild constipation.   pravastatin  (PRAVACHOL ) 40 MG tablet TAKE 1 TABLET BY MOUTH DAILY   pregabalin  (LYRICA ) 225 MG capsule TAKE 1 CAPSULE BY MOUTH 3 TIMES A DAY    psyllium (METAMUCIL) 58.6 % powder Take 1 packet by mouth 3 (three) times daily. Large spoonful in a cup of water  per day   selenium  200 MCG TABS tablet Take 200 mcg by mouth daily.   sildenafil  (VIAGRA ) 100 MG tablet TAKE 1/2 TO 1 TABLET BY MOUTH DAILY AS NEEDED FOR ERECTILE DYSFUNCTION   silver  sulfADIAZINE  (SILVADENE ) 1 % cream Apply 1 Application topically daily.   traMADol  (ULTRAM ) 50 MG tablet TAKE 1 TABLET BY MOUTH 3 TIMES A DAY AS NEEDED   vardenafil  (LEVITRA ) 20 MG tablet Take 0.5-1 tablets (10-20 mg total) by mouth daily as needed for erectile dysfunction.   vitamin B-12 (CYANOCOBALAMIN ) 500 MCG tablet Take 500 mcg by mouth daily.   vitamin E  180 MG (400 UNITS) capsule Take 400 Units by mouth daily.   Zinc  Sulfate 220 (50 Zn) MG TABS Take 1 tablet by mouth daily.   No facility-administered medications prior to visit.   Review of Systems     Objective    BP 127/85 (BP Location: Left Arm, Patient Position: Sitting, Cuff Size: Normal)   Pulse 86   Resp 16   Ht 6' 2 (1.88 m)   Wt 239 lb 6.4 oz (108.6 kg)   SpO2 97%   BMI 30.74 kg/m   Physical Exam   General: Appearance:    Mildly obese male in no acute distress  Eyes:    PERRL, conjunctiva/corneas clear, EOM's intact       Lungs:     Clear to auscultation bilaterally, respirations unlabored  Heart:    Normal heart rate. Normal rhythm. No murmurs, rubs, or gallops.    MS:   All extremities are intact.    Neurologic:   Awake, alert, oriented x 3. No apparent focal neurological defect.         Assessment & Plan        Gastroesophageal reflux disease with associated abdominal distension and constipation Severe acid reflux episode improved with omeprazole  and antacid. Persistent mild burning and occasional dysphagia. Family history of esophageal cancer noted. - Referred to gastroenterologist for further evaluation. - Ordered abdominal ultrasound to assess for infection or other abnormalities. - Advised to avoid spicy  foods and consume bland diet including fruits and chicken soup.  Erectile dysfunction Chronic erectile dysfunction managed with Cialis. No significant changes in physical activity. - Continue current Cialis regimen as needed.         Nancyann Perry, MD  [facility] [phone] (phone) [phone] (fax)  [facility]

## 2024-01-26 MED ORDER — HYDROCODONE-ACETAMINOPHEN 7.5-325 MG PO TABS
ORAL_TABLET | ORAL | 0 refills | Status: AC
Start: 2024-01-26 — End: ?

## 2024-01-28 ENCOUNTER — Ambulatory Visit
Admission: RE | Admit: 2024-01-28 | Discharge: 2024-01-28 | Disposition: A | Discharge status: Home / Self Care | Source: Ambulatory Visit | Attending: Family Medicine | Admitting: Family Medicine

## 2024-01-28 DIAGNOSIS — R140 Abdominal distension (gaseous): Secondary | ICD-10-CM | POA: Insufficient documentation

## 2024-01-28 DIAGNOSIS — K8020 Calculus of gallbladder without cholecystitis without obstruction: Secondary | ICD-10-CM | POA: Diagnosis not present

## 2024-01-28 DIAGNOSIS — R161 Splenomegaly, not elsewhere classified: Secondary | ICD-10-CM | POA: Diagnosis not present

## 2024-02-04 ENCOUNTER — Ambulatory Visit: Payer: Self-pay | Admitting: Family Medicine

## 2024-02-17 ENCOUNTER — Other Ambulatory Visit: Payer: Self-pay | Admitting: Family Medicine

## 2024-02-17 DIAGNOSIS — I700 Atherosclerosis of aorta: Secondary | ICD-10-CM

## 2024-02-22 ENCOUNTER — Telehealth: Payer: Self-pay | Admitting: Family Medicine

## 2024-02-22 NOTE — Telephone Encounter (Signed)
 Disability Placard form being sent back for completion.  Call patient when signed.

## 2024-02-23 ENCOUNTER — Other Ambulatory Visit: Payer: Self-pay | Admitting: Family Medicine

## 2024-02-23 DIAGNOSIS — G609 Hereditary and idiopathic neuropathy, unspecified: Secondary | ICD-10-CM

## 2024-02-23 NOTE — Telephone Encounter (Unsigned)
 Copied from CRM #[id number]. Topic: Clinical - Medication Refill >> Feb 23, 2024  1:14 PM Shardie S wrote: Medication: HYDROcodone -acetaminophen  (NORCO) 7.5-325 MG tablet  Has the patient contacted their pharmacy? Yes (Agent: If no, request that the patient contact the pharmacy for the refill. If patient does not wish to contact the pharmacy document the reason why and proceed with request.) (Agent: If yes, when and what did the pharmacy advise?)  This is the patient's preferred pharmacy:  [facility] [id number] [place], [place] - [address] [place] [address] Phone: [phone] Fax: [phone]  Is this the correct pharmacy for this prescription? Yes If no, delete pharmacy and type the correct one.   Has the prescription been filled recently? No  Is the patient out of the medication? No  Has the patient been seen for an appointment in the last year OR does the patient have an upcoming appointment? Yes  Can we respond through MyChart? Yes  Agent: Please be advised that Rx refills may take up to 3 business days. We ask that you follow-up with your pharmacy.

## 2024-02-25 MED ORDER — HYDROCODONE-ACETAMINOPHEN 7.5-325 MG PO TABS: ORAL_TABLET | ORAL | 0 refills | Status: DC

## 2024-02-25 NOTE — Telephone Encounter (Signed)
 Requested medication (s) are due for refill today - yes  Requested medication (s) are on the active medication list -yes  Future visit scheduled -yes  Last refill: 01/26/24 #120  Notes to clinic: non delegated Rx  Requested Prescriptions  Pending Prescriptions Disp Refills   HYDROcodone -acetaminophen  (NORCO) 7.5-325 MG tablet 120 tablet 0    Sig: 1/2-1 tablet up to four times a day as needed     Not Delegated - Analgesics:  Opioid Agonist Combinations Failed - 02/25/2024 12:21 PM      Failed - This refill cannot be delegated      Failed - Urine Drug Screen completed in last 360 days      Failed - Valid encounter within last 3 months    Recent Outpatient Visits           1 month ago Abdominal bloating   [place] [facility] [place] Nancy[place] BRAVO, MD   1 month ago Epigastric pain   [place] [facility] [place], [place]-C   8 months ago Annual physical exam   [facility] [place] Nancyann BRAVO, MD       Future Appointments             In 4 months Fisher, Nancyann BRAVO, MD [place] [facility], [place]               Requested Prescriptions  Pending Prescriptions Disp Refills   HYDROcodone -acetaminophen  (NORCO) 7.5-325 MG tablet 120 tablet 0    Sig: 1/2-1 tablet up to four times a day as needed     Not Delegated - Analgesics:  Opioid Agonist Combinations Failed - 02/25/2024 12:21 PM      Failed - This refill cannot be delegated      Failed - Urine Drug Screen completed in last 360 days      Failed - Valid encounter within last 3 months    Recent Outpatient Visits           1 month ago Abdominal bloating   [place] [facility] [place] Nancyann BRAVO, MD   1 month ago Epigastric pain    [facility] [place], [place], PA-C   8 months ago Annual physical exam   [facility] [place] Nancyann BRAVO, MD       Future Appointments              In 4 months Fisher, Nancyann BRAVO, MD [facility] [place]

## 2024-03-17 ENCOUNTER — Other Ambulatory Visit: Payer: Self-pay | Admitting: Physician Assistant

## 2024-03-17 DIAGNOSIS — R1013 Epigastric pain: Secondary | ICD-10-CM

## 2024-03-31 ENCOUNTER — Other Ambulatory Visit: Payer: Self-pay | Admitting: Family Medicine

## 2024-03-31 DIAGNOSIS — G609 Hereditary and idiopathic neuropathy, unspecified: Secondary | ICD-10-CM

## 2024-03-31 DIAGNOSIS — G629 Polyneuropathy, unspecified: Secondary | ICD-10-CM

## 2024-03-31 NOTE — Telephone Encounter (Signed)
 Copied from CRM [id number]. Topic: Clinical - Medication Refill >> Mar 31, 2024  3:18 PM Larissa S wrote: Medication: HYDROcodone -acetaminophen  (NORCO) 7.5-325 MG tablet  Has the patient contacted their pharmacy? Yes (Agent: If no, request that the patient contact the pharmacy for the refill. If patient does not wish to contact the pharmacy document the reason why and proceed with request.) (Agent: If yes, when and what did the pharmacy advise?)  This is the patient's preferred pharmacy:  [facility] [id number] [place], [place] - [address] [place] [address] Phone: [phone] Fax: [phone]  Is this the correct pharmacy for this prescription? Yes If no, delete pharmacy and type the correct one.   Has the prescription been filled recently? No  Is the patient out of the medication? No  Has the patient been seen for an appointment in the last year OR does the patient have an upcoming appointment? Yes  Can we respond through MyChart? Yes  Agent: Please be advised that Rx refills may take up to 3 business days. We ask that you follow-up with your pharmacy.

## 2024-04-01 NOTE — Telephone Encounter (Signed)
 Requested medication (s) are due for refill today: yes  Requested medication (s) are on the active medication list: yes  Last refill:  02/25/24  Future visit scheduled: {Yes  Notes to clinic:  Unable to refill per protocol, cannot delegate.      Requested Prescriptions  Pending Prescriptions Disp Refills   HYDROcodone -acetaminophen  (NORCO) 7.5-325 MG tablet 120 tablet 0    Sig: 1/2-1 tablet up to four times a day as needed     Not Delegated - Analgesics:  Opioid Agonist Combinations Failed - 04/01/2024 11:49 AM      Failed - This refill cannot be delegated      Failed - Urine Drug Screen completed in last 360 days      Failed - Valid encounter within last 3 months    Recent Outpatient Visits           2 months ago Abdominal bloating   [place] [facility] [place] Nancyann BRAVO, MD   2 months ago Epigastric pain   [place] [facility] [place], PA-C   9 months ago Annual physical exam   [facility] [place] Nancyann BRAVO, MD       Future Appointments             In 2 months Fisher, Nancyann BRAVO, MD [facility] [place]

## 2024-04-02 MED ORDER — HYDROCODONE-ACETAMINOPHEN 7.5-325 MG PO TABS: ORAL_TABLET | ORAL | 0 refills | Status: AC

## 2024-04-09 ENCOUNTER — Other Ambulatory Visit: Payer: Self-pay | Admitting: Family Medicine

## 2024-04-09 DIAGNOSIS — G629 Polyneuropathy, unspecified: Secondary | ICD-10-CM

## 2024-05-25 ENCOUNTER — Ambulatory Visit

## 2024-06-24 ENCOUNTER — Encounter: Admitting: Family Medicine
